# Patient Record
Sex: Female | Born: 1950
Health system: Southern US, Community
[De-identification: ages and names within clinical notes are randomized; demographics above are authoritative.]

## PROBLEM LIST (undated history)

## (undated) DIAGNOSIS — Z8669 Personal history of other diseases of the nervous system and sense organs: Principal | ICD-10-CM

## (undated) DIAGNOSIS — Z8711 Personal history of peptic ulcer disease: Secondary | ICD-10-CM

## (undated) DIAGNOSIS — R489 Unspecified symbolic dysfunctions: Secondary | ICD-10-CM

## (undated) DIAGNOSIS — Z8719 Personal history of other diseases of the digestive system: Secondary | ICD-10-CM

## (undated) DIAGNOSIS — E0590 Thyrotoxicosis, unspecified without thyrotoxic crisis or storm: Secondary | ICD-10-CM

## (undated) DIAGNOSIS — K5790 Diverticulosis of intestine, part unspecified, without perforation or abscess without bleeding: Secondary | ICD-10-CM

## (undated) DIAGNOSIS — G459 Transient cerebral ischemic attack, unspecified: Secondary | ICD-10-CM

## (undated) DIAGNOSIS — I10 Essential (primary) hypertension: Secondary | ICD-10-CM

## (undated) DIAGNOSIS — F1010 Alcohol abuse, uncomplicated: Secondary | ICD-10-CM

## (undated) DIAGNOSIS — E1142 Type 2 diabetes mellitus with diabetic polyneuropathy: Secondary | ICD-10-CM

## (undated) DIAGNOSIS — T7840XA Allergy, unspecified, initial encounter: Secondary | ICD-10-CM

## (undated) DIAGNOSIS — G8389 Other specified paralytic syndromes: Secondary | ICD-10-CM

## (undated) DIAGNOSIS — G4700 Insomnia, unspecified: Secondary | ICD-10-CM

## (undated) DIAGNOSIS — R627 Adult failure to thrive: Secondary | ICD-10-CM

## (undated) DIAGNOSIS — Z5189 Encounter for other specified aftercare: Secondary | ICD-10-CM

## (undated) DIAGNOSIS — E876 Hypokalemia: Secondary | ICD-10-CM

## (undated) DIAGNOSIS — J189 Pneumonia, unspecified organism: Secondary | ICD-10-CM

## (undated) DIAGNOSIS — D3190 Benign neoplasm of unspecified part of unspecified eye: Secondary | ICD-10-CM

## (undated) DIAGNOSIS — G8250 Quadriplegia, unspecified: Secondary | ICD-10-CM

## (undated) DIAGNOSIS — K219 Gastro-esophageal reflux disease without esophagitis: Secondary | ICD-10-CM

## (undated) DIAGNOSIS — E785 Hyperlipidemia, unspecified: Secondary | ICD-10-CM

## (undated) DIAGNOSIS — R279 Unspecified lack of coordination: Secondary | ICD-10-CM

## (undated) DIAGNOSIS — M6281 Muscle weakness (generalized): Secondary | ICD-10-CM

## (undated) DIAGNOSIS — R413 Other amnesia: Secondary | ICD-10-CM

## (undated) HISTORY — DX: Type 2 diabetes mellitus with diabetic polyneuropathy: E11.42

## (undated) HISTORY — DX: Alcohol abuse, uncomplicated: F10.10

## (undated) HISTORY — PX: ABDOMINAL HYSTERECTOMY: SHX81

## (undated) HISTORY — DX: Thyrotoxicosis, unspecified without thyrotoxic crisis or storm: E05.90

## (undated) HISTORY — DX: Personal history of other diseases of the nervous system and sense organs: Z86.69

## (undated) HISTORY — DX: Unspecified symbolic dysfunctions: R48.9

## (undated) HISTORY — DX: Insomnia, unspecified: G47.00

## (undated) HISTORY — DX: Quadriplegia, unspecified: G82.50

## (undated) HISTORY — DX: Pneumonia, unspecified organism: J18.9

## (undated) HISTORY — PX: ABDOMINAL HYSTERECTOMY: SUR658

## (undated) HISTORY — DX: Essential (primary) hypertension: I10

## (undated) HISTORY — DX: Unspecified lack of coordination: R27.9

## (undated) HISTORY — PX: OTHER SURGICAL HISTORY: SHX169

## (undated) HISTORY — DX: Hypokalemia: E87.6

## (undated) HISTORY — DX: Benign neoplasm of unspecified part of unspecified eye: D31.90

## (undated) HISTORY — DX: Other amnesia: R41.3

## (undated) HISTORY — DX: Encounter for other specified aftercare: Z51.89

## (undated) HISTORY — DX: Other specified paralytic syndromes: G83.89

## (undated) HISTORY — DX: Muscle weakness (generalized): M62.81

## (undated) HISTORY — DX: Adult failure to thrive: R62.7

## (undated) HISTORY — DX: Gastro-esophageal reflux disease without esophagitis: K21.9

## (undated) HISTORY — DX: Allergy, unspecified, initial encounter: T78.40XA

## (undated) HISTORY — DX: Hyperlipidemia, unspecified: E78.5

## (undated) HISTORY — PX: WISDOM TOOTH EXTRACTION: SHX21

---

## 2011-05-11 DIAGNOSIS — J189 Pneumonia, unspecified organism: Secondary | ICD-10-CM

## 2011-05-11 HISTORY — DX: Pneumonia, unspecified organism: J18.9

## 2012-01-30 ENCOUNTER — Emergency Department (HOSPITAL_COMMUNITY)
Admission: EM | Admit: 2012-01-30 | Discharge: 2012-01-30 | Disposition: A | Discharge status: Home / Self Care | Payer: BC Managed Care – HMO | Attending: Emergency Medicine | Admitting: Emergency Medicine

## 2012-01-30 ENCOUNTER — Encounter (HOSPITAL_COMMUNITY): Payer: Self-pay | Admitting: *Deleted

## 2012-01-30 DIAGNOSIS — R7989 Other specified abnormal findings of blood chemistry: Secondary | ICD-10-CM

## 2012-01-30 DIAGNOSIS — E860 Dehydration: Secondary | ICD-10-CM | POA: Insufficient documentation

## 2012-01-30 DIAGNOSIS — N10 Acute tubulo-interstitial nephritis: Secondary | ICD-10-CM

## 2012-01-30 HISTORY — DX: Diverticulosis of intestine, part unspecified, without perforation or abscess without bleeding: K57.90

## 2012-01-30 LAB — URINALYSIS, ROUTINE W REFLEX MICROSCOPIC
Nitrite: NEGATIVE
Specific Gravity, Urine: 1.014 (ref 1.005–1.030)
Urobilinogen, UA: 0.2 mg/dL (ref 0.0–1.0)

## 2012-01-30 LAB — CBC WITH DIFFERENTIAL/PLATELET
Basophils Relative: 0 % (ref 0–1)
Eosinophils Absolute: 0.1 10*3/uL (ref 0.0–0.7)
Eosinophils Relative: 1 % (ref 0–5)
MCH: 31.5 pg (ref 26.0–34.0)
MCHC: 34.5 g/dL (ref 30.0–36.0)
Monocytes Relative: 15 % — ABNORMAL HIGH (ref 3–12)
Neutrophils Relative %: 78 % — ABNORMAL HIGH (ref 43–77)
Platelets: 402 10*3/uL — ABNORMAL HIGH (ref 150–400)

## 2012-01-30 LAB — COMPREHENSIVE METABOLIC PANEL
Albumin: 4 g/dL (ref 3.5–5.2)
Alkaline Phosphatase: 198 U/L — ABNORMAL HIGH (ref 39–117)
BUN: 56 mg/dL — ABNORMAL HIGH (ref 6–23)
Calcium: 10.5 mg/dL (ref 8.4–10.5)
Potassium: 4.1 mEq/L (ref 3.5–5.1)
Sodium: 137 mEq/L (ref 135–145)
Total Protein: 7.7 g/dL (ref 6.0–8.3)

## 2012-01-30 LAB — URINE MICROSCOPIC-ADD ON

## 2012-01-30 MED ORDER — DEXTROSE 5 % IV SOLN
1.0000 g | Freq: Once | INTRAVENOUS | Status: AC
  Administered 2012-01-30: 1 g via INTRAVENOUS
  Filled 2012-01-30: qty 10

## 2012-01-30 MED ORDER — CEPHALEXIN 500 MG PO CAPS: 500.0000 mg | ORAL_CAPSULE | Freq: Four times a day (QID) | ORAL | Status: DC

## 2012-01-30 MED ORDER — SODIUM CHLORIDE 0.9 % IV BOLUS (SEPSIS)
1000.0000 mL | Freq: Once | INTRAVENOUS | Status: AC
  Administered 2012-01-30: 1000 mL via INTRAVENOUS

## 2012-01-30 MED ORDER — ONDANSETRON HCL 4 MG PO TABS: 4.0000 mg | ORAL_TABLET | Freq: Four times a day (QID) | ORAL | Status: DC

## 2012-01-30 MED ORDER — HYDROCODONE-ACETAMINOPHEN 5-500 MG PO TABS: 1.0000 | ORAL_TABLET | Freq: Four times a day (QID) | ORAL | Status: DC | PRN

## 2012-01-30 NOTE — ED Notes (Signed)
Pt resting quietly at the time. Vital signs stable. Family at bedside. No signs of distress noted at present.

## 2012-01-30 NOTE — ED Notes (Signed)
Pt discharged home. Had no further questions at the time. Vital signs stable. 

## 2012-01-30 NOTE — ED Provider Notes (Signed)
History     CSN: [id number]  Arrival date & time 01/30/12  1352   First MD Initiated Contact with Patient 01/30/12 1620      Chief Complaint  Patient presents with  . Abdominal Pain    (Consider location/radiation/quality/duration/timing/severity/associated sxs/prior treatment) HPI Comments: Abigail Powell is a 61 y.o. Female who presents with complaint of dysuria, lower abdominal pain, nausea, right flank pain. States symptoms began with urinary frequency and dysuria about 2 days ago. Today noted right flank pain, and worsening lower abdominal pain. States has hx of diverticulosis. Denies fever, denies vomiting. Denies any changes with bowels. States no history of the same. Took tylenol for the pain prior to coming in. Pain worsened with palpation and urination. Nothing makes it better.    Past Medical History  Diagnosis Date  . Diverticulosis     History reviewed. No pertinent past surgical history.  History reviewed. No pertinent family history.  History  Substance Use Topics  . Smoking status: Never Smoker   . Smokeless tobacco: Not on file  . Alcohol Use: No    OB History    Grav Para Term Preterm Abortions TAB SAB Ect Mult Living                  Review of Systems  Constitutional: Negative for fever, chills, activity change, appetite change and fatigue.  HENT: Negative for neck pain.   Respiratory: Negative.   Cardiovascular: Negative.   Gastrointestinal: Positive for nausea and abdominal pain. Negative for vomiting, diarrhea and constipation.  Genitourinary: Positive for dysuria, frequency and flank pain. Negative for urgency.  Musculoskeletal: Negative for myalgias and back pain.  Skin: Negative.   Neurological: Negative for weakness and numbness.  Hematological: Negative.     Allergies  Asa  Home Medications   Current Outpatient Rx  Name Route Sig Dispense Refill  . ACETAMINOPHEN 500 MG PO TABS Oral Take 500 mg by mouth every 6 (six) hours as needed.  For pain    . AMLODIPINE-OLMESARTAN 5-20 MG PO TABS Oral Take 1 tablet by mouth daily.    Marland Kitchen VITAMIN C PO Oral Take 1 tablet by mouth daily.    Marland Kitchen VITAMIN D PO Oral Take 1 tablet by mouth daily.    Marland Kitchen GEMFIBROZIL 600 MG PO TABS Oral Take 600 mg by mouth 2 (two) times daily before a meal.    . VITAMIN E PO Oral Take 1 capsule by mouth daily.      BP 113/59  Pulse 104  Temp 97.6 F (36.4 C) (Oral)  Resp 20  SpO2 100%  Physical Exam  Nursing note and vitals reviewed. Constitutional: She appears well-developed and well-nourished. No distress.  HENT:  Head: Normocephalic.  Eyes: Conjunctivae normal are normal.  Neck: Neck supple.  Cardiovascular: Normal rate, regular rhythm and normal heart sounds.   Pulmonary/Chest: Effort normal and breath sounds normal. No respiratory distress. She has no wheezes. She has no rales.  Abdominal: Soft. Bowel sounds are normal. She exhibits no distension. There is tenderness. There is no rebound and no guarding.       suprapubic tenderness. Right CVA tenderness.  Musculoskeletal: She exhibits no edema.  Neurological: She is alert.  Skin: Skin is warm and dry.  Psychiatric: She has a normal mood and affect.    ED Course  Procedures (including critical care time)  Pt with right flank pain, suprapubic pain, urinary frequency, dysuria. Labs and UA pending.   Filed Vitals:   01/30/12 1357  BP: 113/59  Pulse: 104  Temp: 97.6 F (36.4 C)  Resp: 20   Results for orders placed during the hospital encounter of 01/30/12  URINALYSIS, ROUTINE W REFLEX MICROSCOPIC      Component Value Range   Color, Urine YELLOW  YELLOW   APPearance TURBID (*) CLEAR   Specific Gravity, Urine 1.014  1.005 - 1.030   pH 6.0  5.0 - 8.0   Glucose, UA NEGATIVE  NEGATIVE mg/dL   Hgb urine dipstick LARGE (*) NEGATIVE   Bilirubin Urine NEGATIVE  NEGATIVE   Ketones, ur NEGATIVE  NEGATIVE mg/dL   Protein, ur 161 (*) NEGATIVE mg/dL   Urobilinogen, UA 0.2  0.0 - 1.0 mg/dL    Nitrite NEGATIVE  NEGATIVE   Leukocytes, UA LARGE (*) NEGATIVE  URINE MICROSCOPIC-ADD ON      Component Value Range   WBC, UA TOO NUMEROUS TO COUNT  <3 WBC/hpf   RBC / HPF 7-10  <3 RBC/hpf   Bacteria, UA FEW (*) RARE  CBC WITH DIFFERENTIAL      Component Value Range   WBC 14.6 (*) 4.0 - 10.5 K/uL   RBC 4.19  3.87 - 5.11 MIL/uL   Hemoglobin 13.2  12.0 - 15.0 g/dL   HCT 09.6  04.5 - 40.9 %   MCV 91.4  78.0 - 100.0 fL   MCH 31.5  26.0 - 34.0 pg   MCHC 34.5  30.0 - 36.0 g/dL   RDW 81.1  91.4 - 78.2 %   Platelets 402 (*) 150 - 400 K/uL   Neutrophils Relative 78 (*) 43 - 77 %   Neutro Abs 11.4 (*) 1.7 - 7.7 K/uL   Lymphocytes Relative 6 (*) 12 - 46 %   Lymphs Abs 0.8  0.7 - 4.0 K/uL   Monocytes Relative 15 (*) 3 - 12 %   Monocytes Absolute 2.3 (*) 0.1 - 1.0 K/uL   Eosinophils Relative 1  0 - 5 %   Eosinophils Absolute 0.1  0.0 - 0.7 K/uL   Basophils Relative 0  0 - 1 %   Basophils Absolute 0.0  0.0 - 0.1 K/uL  COMPREHENSIVE METABOLIC PANEL      Component Value Range   Sodium 137  135 - 145 mEq/L   Potassium 4.1  3.5 - 5.1 mEq/L   Chloride 106  96 - 112 mEq/L   CO2 12 (*) 19 - 32 mEq/L   Glucose, Bld 124 (*) 70 - 99 mg/dL   BUN 56 (*) 6 - 23 mg/dL   Creatinine, Ser 9.56 (*) 0.50 - 1.10 mg/dL   Calcium 21.3  8.4 - 08.6 mg/dL   Total Protein 7.7  6.0 - 8.3 g/dL   Albumin 4.0  3.5 - 5.2 g/dL   AST 578 (*) 0 - 37 U/L   ALT 212 (*) 0 - 35 U/L   Alkaline Phosphatase 198 (*) 39 - 117 U/L   Total Bilirubin 0.4  0.3 - 1.2 mg/dL   GFR calc non Af Amer 45 (*) >90 mL/min   GFR calc Af Amer 52 (*) >90 mL/min   6:05 PM Pt's ua is infected. Cultures sent. Her WBC elevated, consistent with her infection. Based on exam, suspect pyelonephritis. She received rocephin 1gIV in ED. She is non toxic. She is afebrile, tolerating PO fluids, no vomiting. VS normal. Abdomen soft, no surgical abdomen. I believe pt is stable at this time for d/c home and outpatient therapy. Her LFTs are elevated, and  this was discussed  with her and her family, and she will need further follow up for recheck. She is not tender in RUQ, doubt acute hepatitis or cholecystitis.   Filed Vitals:   01/30/12 1810  BP: 98/64  Pulse: 92  Temp: 97.2 F (36.2 C)  Resp: 22     1. Pyelonephritis, acute   2. Elevated LFTs   3. Dehydration       MDM   Pt with supraprubic pain, Right CVA tenderness/flank pain. UA infected. Treat for pyelonephritis. Pt's vs normal, she is non toxic, not vomiting, appropriate for outpatient therapy. LFTs elevated, will need further testing and monitoring, no sighs of acute hepatitis or cholecystitis. Renal function slightly bumped, suspect due to dehydration. Instructed to return if worsening.         Lottie Mussel, PA 01/30/12 [phone]

## 2012-01-30 NOTE — ED Notes (Signed)
To ED for eval of lower abd pain. Started last night. Pt states she has pain after urination

## 2012-01-31 NOTE — ED Provider Notes (Signed)
Medical screening examination/treatment/procedure(s) were performed by non-physician practitioner and as supervising physician I was immediately available for consultation/collaboration.   Shelda Jakes, MD 01/31/12 1329

## 2012-02-01 LAB — URINE CULTURE: Colony Count: >=100000

## 2012-02-02 ENCOUNTER — Telehealth (HOSPITAL_COMMUNITY): Payer: Self-pay | Admitting: Emergency Medicine

## 2012-02-02 NOTE — ED Notes (Signed)
Results received from [facility], >/= 100,000 colonies -> E Coli.   Rx given in ED for Keflex  -> sensitive to the same.  Chart appended per protocol.  

## 2012-02-25 ENCOUNTER — Observation Stay (HOSPITAL_COMMUNITY)
Admission: EM | Admit: 2012-02-25 | Discharge: 2012-02-28 | Disposition: A | Discharge status: Home / Self Care | Payer: BC Managed Care – HMO | Source: Ambulatory Visit | Attending: Surgery | Admitting: Surgery

## 2012-02-25 ENCOUNTER — Emergency Department (HOSPITAL_COMMUNITY): Payer: BC Managed Care – HMO

## 2012-02-25 ENCOUNTER — Encounter (HOSPITAL_COMMUNITY): Payer: Self-pay | Admitting: *Deleted

## 2012-02-25 DIAGNOSIS — R109 Unspecified abdominal pain: Secondary | ICD-10-CM

## 2012-02-25 DIAGNOSIS — D649 Anemia, unspecified: Secondary | ICD-10-CM | POA: Insufficient documentation

## 2012-02-25 DIAGNOSIS — K8010 Calculus of gallbladder with chronic cholecystitis without obstruction: Principal | ICD-10-CM | POA: Insufficient documentation

## 2012-02-25 DIAGNOSIS — Z8711 Personal history of peptic ulcer disease: Secondary | ICD-10-CM | POA: Insufficient documentation

## 2012-02-25 DIAGNOSIS — R112 Nausea with vomiting, unspecified: Secondary | ICD-10-CM | POA: Insufficient documentation

## 2012-02-25 LAB — COMPREHENSIVE METABOLIC PANEL
ALT: 10 U/L (ref 0–35)
AST: 14 U/L (ref 0–37)
Albumin: 3.8 g/dL (ref 3.5–5.2)
Alkaline Phosphatase: 68 U/L (ref 39–117)
BUN: 6 mg/dL (ref 6–23)
CO2: 27 mEq/L (ref 19–32)
Calcium: 9.3 mg/dL (ref 8.4–10.5)
Chloride: 98 mEq/L (ref 96–112)
Creatinine, Ser: 0.78 mg/dL (ref 0.50–1.10)
GFR calc Af Amer: >90 mL/min (ref >90)
GFR calc non Af Amer: 88 mL/min — ABNORMAL LOW (ref >90)
Glucose, Bld: 111 mg/dL — ABNORMAL HIGH (ref 70–99)
Potassium: 3.6 mEq/L (ref 3.5–5.1)
Sodium: 135 mEq/L (ref 135–145)
Total Bilirubin: 0.2 mg/dL — ABNORMAL LOW (ref 0.3–1.2)
Total Protein: 7.4 g/dL (ref 6.0–8.3)

## 2012-02-25 LAB — CBC WITH DIFFERENTIAL/PLATELET
Basophils Absolute: 0 10*3/uL (ref 0.0–0.1)
Basophils Relative: 0 % (ref 0–1)
Eosinophils Absolute: 0.2 10*3/uL (ref 0.0–0.7)
Eosinophils Relative: 2 % (ref 0–5)
HCT: 41.6 % (ref 36.0–46.0)
Hemoglobin: 14.5 g/dL (ref 12.0–15.0)
Lymphocytes Relative: 12 % (ref 12–46)
Lymphs Abs: 1.2 10*3/uL (ref 0.7–4.0)
MCH: 30.3 pg (ref 26.0–34.0)
MCHC: 34.9 g/dL (ref 30.0–36.0)
MCV: 86.8 fL (ref 78.0–100.0)
Monocytes Absolute: 0.4 10*3/uL (ref 0.1–1.0)
Monocytes Relative: 4 % (ref 3–12)
Neutro Abs: 8.5 10*3/uL — ABNORMAL HIGH (ref 1.7–7.7)
Neutrophils Relative %: 83 % — ABNORMAL HIGH (ref 43–77)
Platelets: 279 10*3/uL (ref 150–400)
RBC: 4.79 MIL/uL (ref 3.87–5.11)
RDW: 12.8 % (ref 11.5–15.5)
WBC: 10.2 10*3/uL (ref 4.0–10.5)

## 2012-02-25 LAB — URINE MICROSCOPIC-ADD ON

## 2012-02-25 LAB — URINALYSIS, ROUTINE W REFLEX MICROSCOPIC
Glucose, UA: NEGATIVE mg/dL
Hgb urine dipstick: NEGATIVE
Nitrite: NEGATIVE
Protein, ur: 100 mg/dL — AB
Specific Gravity, Urine: 1.017 (ref 1.005–1.030)
Urobilinogen, UA: 0.2 mg/dL (ref 0.0–1.0)
pH: 5.5 (ref 5.0–8.0)

## 2012-02-25 LAB — LIPASE, BLOOD: Lipase: 27 U/L (ref 11–59)

## 2012-02-25 MED ORDER — ONDANSETRON HCL 4 MG/2ML IJ SOLN
INTRAMUSCULAR | Status: AC
  Administered 2012-02-25: 4 mg
  Filled 2012-02-25: qty 2

## 2012-02-25 MED ORDER — SODIUM CHLORIDE 0.9 % IV BOLUS (SEPSIS)
1000.0000 mL | Freq: Once | INTRAVENOUS | Status: AC
  Administered 2012-02-25: 1000 mL via INTRAVENOUS

## 2012-02-25 MED ORDER — MORPHINE SULFATE 4 MG/ML IJ SOLN
4.0000 mg | Freq: Once | INTRAMUSCULAR | Status: AC
  Administered 2012-02-25: 4 mg via INTRAVENOUS
  Filled 2012-02-25: qty 1

## 2012-02-25 MED ORDER — ONDANSETRON HCL 4 MG/2ML IJ SOLN
4.0000 mg | Freq: Once | INTRAMUSCULAR | Status: AC
  Administered 2012-02-25: 4 mg via INTRAVENOUS
  Filled 2012-02-25: qty 2

## 2012-02-25 MED ORDER — IOHEXOL 300 MG/ML  SOLN
80.0000 mL | Freq: Once | INTRAMUSCULAR | Status: AC | PRN
  Administered 2012-02-25: 80 mL via INTRAVENOUS

## 2012-02-25 MED ORDER — MORPHINE SULFATE 4 MG/ML IJ SOLN
6.0000 mg | Freq: Once | INTRAMUSCULAR | Status: AC
  Administered 2012-02-25: 6 mg via INTRAVENOUS
  Filled 2012-02-25: qty 2

## 2012-02-25 NOTE — H&P (Signed)
Reason for Consult:abdominal pain Referring Physician: Faviola Powell is an 61 y.o. female.  HPI: I was asked to evaluate this patient for cholelithiasis and abdominal pain. She was seen about one month ago in the emergency room for some right flank pain and was treated for a urinary tract infection. Her symptoms seem to get better but over the last month she has had anorexia. Over the last 3-4 days she has had increasing nausea with some abdominal pain which she describes as a 5/10 pain located mainly in the right upper quadrant. She's also had diarrhea and some chills and some subjective fevers she has not measured her temperature. She denies any heartburn or reflux and says that she is healthy other than a history of hypertension  Past Medical History  Diagnosis Date  . Diverticulosis     History reviewed. No pertinent past surgical history.  No family history on file.  Social History:  reports that she has never smoked. She does not have any smokeless tobacco history on file. She reports that she does not drink alcohol. Her drug history not on file.  Allergies:  Allergies  Allergen Reactions  . Asa (Aspirin) Other (See Comments)    Causes ulcer    Medications: I have reviewed the patient's current medications.  Results for orders placed during the hospital encounter of 02/25/12 (from the past 48 hour(s))  CBC WITH DIFFERENTIAL     Status: Abnormal   Collection Time   02/25/12  1:51 PM      Component Value Range Comment   WBC 10.2  4.0 - 10.5 K/uL    RBC 4.79  3.87 - 5.11 MIL/uL    Hemoglobin 14.5  12.0 - 15.0 g/dL    HCT 16.1  09.6 - 04.5 %    MCV 86.8  78.0 - 100.0 fL    MCH 30.3  26.0 - 34.0 pg    MCHC 34.9  30.0 - 36.0 g/dL    RDW 40.9  81.1 - 91.4 %    Platelets 279  150 - 400 K/uL    Neutrophils Relative 83 (*) 43 - 77 %    Neutro Abs 8.5 (*) 1.7 - 7.7 K/uL    Lymphocytes Relative 12  12 - 46 %    Lymphs Abs 1.2  0.7 - 4.0 K/uL    Monocytes Relative 4  3  - 12 %    Monocytes Absolute 0.4  0.1 - 1.0 K/uL    Eosinophils Relative 2  0 - 5 %    Eosinophils Absolute 0.2  0.0 - 0.7 K/uL    Basophils Relative 0  0 - 1 %    Basophils Absolute 0.0  0.0 - 0.1 K/uL   COMPREHENSIVE METABOLIC PANEL     Status: Abnormal   Collection Time   02/25/12  1:51 PM      Component Value Range Comment   Sodium 135  135 - 145 mEq/L    Potassium 3.6  3.5 - 5.1 mEq/L    Chloride 98  96 - 112 mEq/L    CO2 27  19 - 32 mEq/L    Glucose, Bld 111 (*) 70 - 99 mg/dL    BUN 6  6 - 23 mg/dL    Creatinine, Ser 7.82  0.50 - 1.10 mg/dL    Calcium 9.3  8.4 - 95.6 mg/dL    Total Protein 7.4  6.0 - 8.3 g/dL    Albumin 3.8  3.5 - 5.2 g/dL  AST 14  0 - 37 U/L    ALT 10  0 - 35 U/L    Alkaline Phosphatase 68  39 - 117 U/L    Total Bilirubin 0.2 (*) 0.3 - 1.2 mg/dL    GFR calc non Af Amer 88 (*) >90 mL/min    GFR calc Af Amer >90  >90 mL/min   URINALYSIS, ROUTINE W REFLEX MICROSCOPIC     Status: Abnormal   Collection Time   02/25/12  2:43 PM      Component Value Range Comment   Color, Urine AMBER (*) YELLOW BIOCHEMICALS MAY BE AFFECTED BY COLOR   APPearance CLOUDY (*) CLEAR    Specific Gravity, Urine 1.017  1.005 - 1.030    pH 5.5  5.0 - 8.0    Glucose, UA NEGATIVE  NEGATIVE mg/dL    Hgb urine dipstick NEGATIVE  NEGATIVE    Bilirubin Urine MODERATE (*) NEGATIVE    Ketones, ur TRACE (*) NEGATIVE mg/dL    Protein, ur 409 (*) NEGATIVE mg/dL    Urobilinogen, UA 0.2  0.0 - 1.0 mg/dL    Nitrite NEGATIVE  NEGATIVE    Leukocytes, UA SMALL (*) NEGATIVE   URINE MICROSCOPIC-ADD ON     Status: Abnormal   Collection Time   02/25/12  2:43 PM      Component Value Range Comment   Squamous Epithelial / LPF FEW (*) RARE    WBC, UA 11-20  <3 WBC/hpf    Bacteria, UA FEW (*) RARE   LIPASE, BLOOD     Status: Normal   Collection Time   02/25/12  4:41 PM      Component Value Range Comment   Lipase 27  11 - 59 U/L     US Abdomen Complete  02/25/2012  *RADIOLOGY REPORT*   Clinical Data:  Evaluate for cholecystitis  COMPLETE ABDOMINAL ULTRASOUND  Comparison:  Prior CT scan same day  Findings:  Gallbladder:  Distended gallbladder is noted.  Tiny gallstones and sludge noted layering within gallbladder.  No thickening of the gallbladder wall.  No sonographic Murphy's sign.  No pericholecystic fluid.  Common bile duct:  Measures 5 mm in diameter within normal limits.  Liver:  No focal lesion identified.  Within normal limits in parenchymal echogenicity.  IVC:  Appears normal.  Pancreas:  No focal abnormality seen.  Spleen:  Measures 4.2 cm in length.  Normal echogenicity.  Right Kidney:  Measures 11.7 cm in length.  No mass, hydronephrosis or diagnostic renal calculus  Left Kidney:  Measures 10.7 cm in length.  No mass, hydronephrosis or diagnostic renal calculus  Abdominal aorta:  No aneurysm identified. Measures up to 2.3 cm in diameter.  IMPRESSION: 1. Distended gallbladder is noted.  Tiny gallstones and sludge noted layering within gallbladder.  No thickening of the gallbladder wall.  No sonographic Murphy's sign.  No pericholecystic fluid. 2.  Normal CBD. 3.  No hydronephrosis or diagnostic renal calculus.   Original Report Authenticated By: Natasha Mead, M.D.    Ct Abdomen Pelvis W Contrast  02/25/2012  *RADIOLOGY REPORT*  Clinical Data: Left lower quadrant pain, history of diverticulosis  CT ABDOMEN AND PELVIS WITH CONTRAST  Technique:  Multidetector CT imaging of the abdomen and pelvis was performed following the standard protocol during bolus administration of intravenous contrast.  Contrast: 80mL OMNIPAQUE IOHEXOL 300 MG/ML  SOLN  Comparison: None.  Findings: Sagittal images of the spine shows no destructive bony lesions.  Lung bases are unremarkable.  Liver, spleen, pancreas  and adrenal glands are unremarkable.  A distended gallbladder is noted.  Layering tiny gallstones are noted within gallbladder the largest measures 2.7 mm.  No pericholecystic fluid.  Kidneys are  symmetrical in size and enhancement.  There is a cyst in the mid pole posterior aspect of the right kidney measures 7 mm. A cyst in the lower pole anterior aspect of the left kidney measures 6 mm.  No hydronephrosis or hydroureter.  Delayed renal images shows bilateral renal symmetrical excretion.  Atherosclerotic calcifications of the abdominal aorta and the iliac arteries are noted.  Right colon diverticula are noted without evidence of acute diverticulitis.  No pericecal inflammation.  A normal appendix is clearly visualized axial image 54.  The terminal ileum is unremarkable.  The urinary bladder is under distended grossly unremarkable.  The uterus and adnexa are not identified.  No destructive bony lesions are noted within pelvis.  Coronal images shows no destructive bony lesions.  Few proximal sigmoid colon diverticula are noted without evidence of acute diverticulitis.  No pelvic ascites or adenopathy.  No inguinal adenopathy.  IMPRESSION:  1.  No small bowel obstruction.  No ascites or free air. 2.  There is a distended gallbladder.  Small layering gallstones are noted within gallbladder.  Largest measures 2.7 mm.  No pericholecystic fluid. 3.  No hydronephrosis or hydroureter.  4.  Colonic diverticula are noted without evidence of acute diverticulitis. 5.  Bilateral small renal cyst. 6.  Normal appendix.  No pericecal inflammation.   Original Report Authenticated By: Natasha Mead, M.D.    All other review of systems negative or noncontributory except as stated in the HPI  Blood pressure 103/59, pulse 103, temperature 98.9 F (37.2 C), temperature source Oral, resp. rate 20, weight 120 lb (54.432 kg), SpO2 100.00%. General appearance: alert, cooperative and no distress Head: Normocephalic, without obvious abnormality, atraumatic Eyes: exopthalmos L>R, no icterus Neck: no JVD and supple, symmetrical, trachea midline Resp: clear to auscultation bilaterally Cardio: normal rate, regular GI: soft, mild  right sided and lower abdominal tenderness, greatest in RUQ, ND, no peritoneal signs Extremities: extremities normal, atraumatic, no cyanosis or edema Pulses: 2+ and symmetric Skin: Skin color, texture, turgor normal. No rashes or lesions Neurologic: Grossly normal  Assessment/Plan: Abdominal pain with cholelithiasis She has ongoing abdominal pain and nausea as well as cholelithiasis seen on ultrasound and CT scan. She does not show any evidence of acute cholecystitis and her white blood cell count and LFTs are normal. However given her persistent nausea and abdominal pain, I have recommended admission for at least overnight observation. If she is feeling better in the morning than will have the options to advance her diet and consider discharge and possible elective cholecystectomy versus proceeding with cholecystectomy at this admission. I did discuss with the patient the procedure and its risks and the alternatives. The risks of infection, bleeding, pain, persistent symptoms, scarring, injury to bowel or bile ducts, retained stone, diarrhea, need for additional procedures, and need for open surgery discussed with the patient.  She expressed understanding and agrees with the plan.   Lodema Pilot DAVID 02/25/2012, 11:20 PM

## 2012-02-25 NOTE — ED Provider Notes (Signed)
History    61yf with abdominal pain. Pt has been feeling "not well" for past several weeks, but worse in past few days. Diffuse abdominal "discomfort." Pt hesitant to call it pain. Does not localize. No appreciable exacerbating or relieving factors. Associated with n/v. Diarrhea yesterday which resolved. Recent evaluation and tx for UTI. Says current symptoms feel different. No fever or chills. Surgical hx significant for hysterectomy.   CSN: [id number]  Arrival date & time 02/25/12  1251   First MD Initiated Contact with Patient 02/25/12 1333      Chief Complaint  Patient presents with  . Emesis  . Diarrhea  . Abdominal Pain    (Consider location/radiation/quality/duration/timing/severity/associated sxs/prior treatment) HPI  Past Medical History  Diagnosis Date  . Diverticulosis     History reviewed. No pertinent past surgical history.  No family history on file.  History  Substance Use Topics  . Smoking status: Never Smoker   . Smokeless tobacco: Not on file  . Alcohol Use: No    OB History    Grav Para Term Preterm Abortions TAB SAB Ect Mult Living                  Review of Systems   Review of symptoms negative unless otherwise noted in HPI.   Allergies  Asa  Home Medications   Current Outpatient Rx  Name Route Sig Dispense Refill  . AMLODIPINE-OLMESARTAN 5-20 MG PO TABS Oral Take 1 tablet by mouth daily.    Marland Kitchen VITAMIN C PO Oral Take 1 tablet by mouth daily.    . CEPHALEXIN 500 MG PO CAPS Oral Take 500 mg by mouth 4 (four) times daily.    Marland Kitchen VITAMIN D PO Oral Take 1 tablet by mouth daily.    . OMEGA-3 FATTY ACIDS 1000 MG PO CAPS Oral Take 1 g by mouth daily.    Marland Kitchen GEMFIBROZIL 600 MG PO TABS Oral Take 600 mg by mouth 2 (two) times daily before a meal.    . HYDROCODONE-ACETAMINOPHEN 5-500 MG PO TABS Oral Take 1-2 tablets by mouth every 6 (six) hours as needed for pain. 15 tablet 0  . VITAMIN E PO Oral Take 1 capsule by mouth daily.      BP 100/52   Pulse 100  Temp 97.5 F (36.4 C) (Oral)  Resp 18  Wt 120 lb (54.432 kg)  SpO2 100%  Physical Exam  Nursing note and vitals reviewed. Constitutional: She appears well-developed and well-nourished. No distress.  HENT:  Head: Normocephalic and atraumatic.  Eyes: Conjunctivae normal are normal. Right eye exhibits no discharge. Left eye exhibits no discharge.  Neck: Neck supple.  Cardiovascular: Regular rhythm and normal heart sounds.  Exam reveals no gallop and no friction rub.   No murmur heard.      Mildly tachycardic  Pulmonary/Chest: Effort normal and breath sounds normal. No respiratory distress.  Abdominal: Soft. She exhibits no distension. There is tenderness. There is guarding. There is no rebound.       Diffuse tenderness, worse on L side  Genitourinary:       No cva tenderness  Musculoskeletal: She exhibits no edema and no tenderness.  Neurological: She is alert.  Skin: Skin is warm and dry.  Psychiatric: She has a normal mood and affect. Her behavior is normal. Thought content normal.    ED Course  Procedures (including critical care time)  Labs Reviewed  CBC WITH DIFFERENTIAL - Abnormal; Notable for the following:    Neutrophils Relative 83 (*)  Neutro Abs 8.5 (*)     All other components within normal limits  COMPREHENSIVE METABOLIC PANEL - Abnormal; Notable for the following:    Glucose, Bld 111 (*)     Total Bilirubin 0.2 (*)     GFR calc non Af Amer 88 (*)     All other components within normal limits  URINALYSIS, ROUTINE W REFLEX MICROSCOPIC - Abnormal; Notable for the following:    Color, Urine AMBER (*)  BIOCHEMICALS MAY BE AFFECTED BY COLOR   APPearance CLOUDY (*)     Bilirubin Urine MODERATE (*)     Ketones, ur TRACE (*)     Protein, ur 100 (*)     Leukocytes, UA SMALL (*)     All other components within normal limits  URINE MICROSCOPIC-ADD ON - Abnormal; Notable for the following:    Squamous Epithelial / LPF FEW (*)     Bacteria, UA FEW (*)       All other components within normal limits  LIPASE, BLOOD   Ct Abdomen Pelvis W Contrast  02/25/2012  *RADIOLOGY REPORT*  Clinical Data: Left lower quadrant pain, history of diverticulosis  CT ABDOMEN AND PELVIS WITH CONTRAST  Technique:  Multidetector CT imaging of the abdomen and pelvis was performed following the standard protocol during bolus administration of intravenous contrast.  Contrast: 80mL OMNIPAQUE IOHEXOL 300 MG/ML  SOLN  Comparison: None.  Findings: Sagittal images of the spine shows no destructive bony lesions.  Lung bases are unremarkable.  Liver, spleen, pancreas and adrenal glands are unremarkable.  A distended gallbladder is noted.  Layering tiny gallstones are noted within gallbladder the largest measures 2.7 mm.  No pericholecystic fluid.  Kidneys are symmetrical in size and enhancement.  There is a cyst in the mid pole posterior aspect of the right kidney measures 7 mm. A cyst in the lower pole anterior aspect of the left kidney measures 6 mm.  No hydronephrosis or hydroureter.  Delayed renal images shows bilateral renal symmetrical excretion.  Atherosclerotic calcifications of the abdominal aorta and the iliac arteries are noted.  Right colon diverticula are noted without evidence of acute diverticulitis.  No pericecal inflammation.  A normal appendix is clearly visualized axial image 54.  The terminal ileum is unremarkable.  The urinary bladder is under distended grossly unremarkable.  The uterus and adnexa are not identified.  No destructive bony lesions are noted within pelvis.  Coronal images shows no destructive bony lesions.  Few proximal sigmoid colon diverticula are noted without evidence of acute diverticulitis.  No pelvic ascites or adenopathy.  No inguinal adenopathy.  IMPRESSION:  1.  No small bowel obstruction.  No ascites or free air. 2.  There is a distended gallbladder.  Small layering gallstones are noted within gallbladder.  Largest measures 2.7 mm.  No  pericholecystic fluid. 3.  No hydronephrosis or hydroureter.  4.  Colonic diverticula are noted without evidence of acute diverticulitis. 5.  Bilateral small renal cyst. 6.  Normal appendix.  No pericecal inflammation.   Original Report Authenticated By: Natasha Mead, M.D.      1. Abdominal pain       MDM  61yF with abdominal pain. Diffuse tenderness on exam, but does seem to be worse on L side. Re-examination unchanged after meds. CT with gallstones, GB distension and GB wall upper limits of normal. Will [place].         Raeford Razor, MD 02/25/12 1655

## 2012-02-25 NOTE — ED Notes (Signed)
Pt's daughter states "she was seen here recently for a kidney infection, she just moved down here from [place] 1 month ago and she has been sick the whole time"; pt states "I feel different than I do when I was here last time"

## 2012-02-26 ENCOUNTER — Encounter (HOSPITAL_COMMUNITY): Payer: Self-pay | Admitting: Anesthesiology

## 2012-02-26 ENCOUNTER — Observation Stay (HOSPITAL_COMMUNITY): Payer: BC Managed Care – HMO | Admitting: Anesthesiology

## 2012-02-26 ENCOUNTER — Encounter (HOSPITAL_COMMUNITY)
Admission: EM | Disposition: A | Discharge status: Home / Self Care | Payer: Self-pay | Source: Ambulatory Visit | Attending: Emergency Medicine

## 2012-02-26 ENCOUNTER — Observation Stay (HOSPITAL_COMMUNITY): Payer: BC Managed Care – HMO

## 2012-02-26 HISTORY — PX: CHOLECYSTECTOMY: SHX55

## 2012-02-26 LAB — CBC
MCH: 30.4 pg (ref 26.0–34.0)
MCHC: 33.3 g/dL (ref 30.0–36.0)
MCV: 91.1 fL (ref 78.0–100.0)
Platelets: 332 10*3/uL (ref 150–400)
Platelets: 305 10*3/uL (ref 150–400)
RBC: 3.09 MIL/uL — ABNORMAL LOW (ref 3.87–5.11)
RDW: 12.9 % (ref 11.5–15.5)
RDW: 12.9 % (ref 11.5–15.5)
WBC: 6.9 10*3/uL (ref 4.0–10.5)

## 2012-02-26 LAB — COMPREHENSIVE METABOLIC PANEL
Albumin: 2.8 g/dL — ABNORMAL LOW (ref 3.5–5.2)
Alkaline Phosphatase: 115 U/L (ref 39–117)
BUN: 17 mg/dL (ref 6–23)
Chloride: 110 mEq/L (ref 96–112)
Creatinine, Ser: 1.04 mg/dL (ref 0.50–1.10)
GFR calc Af Amer: 65 mL/min — ABNORMAL LOW (ref >90)
Glucose, Bld: 120 mg/dL — ABNORMAL HIGH (ref 70–99)
Potassium: 3.8 mEq/L (ref 3.5–5.1)
Total Bilirubin: 0.6 mg/dL (ref 0.3–1.2)
Total Protein: 5.4 g/dL — ABNORMAL LOW (ref 6.0–8.3)

## 2012-02-26 SURGERY — LAPAROSCOPIC CHOLECYSTECTOMY WITH INTRAOPERATIVE CHOLANGIOGRAM
Anesthesia: General | Site: Abdomen | Wound class: Clean Contaminated

## 2012-02-26 MED ORDER — ENOXAPARIN SODIUM 40 MG/0.4ML ~~LOC~~ SOLN
40.0000 mg | SUBCUTANEOUS | Status: DC
  Administered 2012-02-27 – 2012-02-28 (×2): 40 mg via SUBCUTANEOUS
  Filled 2012-02-26 (×4): qty 0.4

## 2012-02-26 MED ORDER — CIPROFLOXACIN IN D5W 400 MG/200ML IV SOLN
400.0000 mg | Freq: Two times a day (BID) | INTRAVENOUS | Status: DC
  Administered 2012-02-26: 400 mg via INTRAVENOUS
  Filled 2012-02-26 (×2): qty 200

## 2012-02-26 MED ORDER — LACTATED RINGERS IR SOLN
Status: DC | PRN
  Administered 2012-02-26: 1

## 2012-02-26 MED ORDER — OXYCODONE HCL 5 MG/5ML PO SOLN: 5.0000 mg | Freq: Once | ORAL | Status: DC | PRN

## 2012-02-26 MED ORDER — ONDANSETRON HCL 4 MG/2ML IJ SOLN
4.0000 mg | Freq: Four times a day (QID) | INTRAMUSCULAR | Status: DC | PRN
  Administered 2012-02-26: 4 mg via INTRAVENOUS
  Filled 2012-02-26: qty 2

## 2012-02-26 MED ORDER — ACETAMINOPHEN 10 MG/ML IV SOLN
1000.0000 mg | Freq: Once | INTRAVENOUS | Status: DC | PRN
  Filled 2012-02-26: qty 100

## 2012-02-26 MED ORDER — KCL IN DEXTROSE-NACL 20-5-0.45 MEQ/L-%-% IV SOLN
INTRAVENOUS | Status: DC
  Administered 2012-02-26 (×2): via INTRAVENOUS
  Filled 2012-02-26 (×4): qty 1000

## 2012-02-26 MED ORDER — BUPIVACAINE HCL (PF) 0.25 % IJ SOLN
INTRAMUSCULAR | Status: DC | PRN
  Administered 2012-02-26: 15 mL

## 2012-02-26 MED ORDER — GLYCOPYRROLATE 0.2 MG/ML IJ SOLN
INTRAMUSCULAR | Status: DC | PRN
  Administered 2012-02-26: 0.6 mg via INTRAVENOUS

## 2012-02-26 MED ORDER — HYDROCODONE-ACETAMINOPHEN 5-325 MG PO TABS
1.0000 | ORAL_TABLET | ORAL | Status: DC | PRN
  Administered 2012-02-27 – 2012-02-28 (×3): 1 via ORAL
  Filled 2012-02-26 (×3): qty 1

## 2012-02-26 MED ORDER — IRBESARTAN 75 MG PO TABS
75.0000 mg | ORAL_TABLET | Freq: Every day | ORAL | Status: DC
  Administered 2012-02-27 – 2012-02-28 (×2): 75 mg via ORAL
  Filled 2012-02-26 (×2): qty 1

## 2012-02-26 MED ORDER — LACTATED RINGERS IV SOLN
INTRAVENOUS | Status: DC | PRN
  Administered 2012-02-26: 13:00:00 via INTRAVENOUS

## 2012-02-26 MED ORDER — OXYCODONE HCL 5 MG PO TABS: 5.0000 mg | ORAL_TABLET | Freq: Once | ORAL | Status: DC | PRN

## 2012-02-26 MED ORDER — PROPOFOL 10 MG/ML IV BOLUS
INTRAVENOUS | Status: DC | PRN
  Administered 2012-02-26: 140 mg via INTRAVENOUS

## 2012-02-26 MED ORDER — 0.9 % SODIUM CHLORIDE (POUR BTL) OPTIME
TOPICAL | Status: DC | PRN
  Administered 2012-02-26: 1000 mL

## 2012-02-26 MED ORDER — LIDOCAINE-EPINEPHRINE (PF) 1 %-1:200000 IJ SOLN
INTRAMUSCULAR | Status: DC | PRN
  Administered 2012-02-26: 15 mL

## 2012-02-26 MED ORDER — IOHEXOL 300 MG/ML  SOLN
INTRAMUSCULAR | Status: DC | PRN
  Administered 2012-02-26: 7 mL via INTRAVENOUS

## 2012-02-26 MED ORDER — MORPHINE SULFATE 2 MG/ML IJ SOLN
2.0000 mg | INTRAMUSCULAR | Status: DC | PRN
  Administered 2012-02-26 – 2012-02-27 (×3): 2 mg via INTRAVENOUS
  Filled 2012-02-26 (×4): qty 1

## 2012-02-26 MED ORDER — ONDANSETRON HCL 4 MG/2ML IJ SOLN
4.0000 mg | Freq: Four times a day (QID) | INTRAMUSCULAR | Status: DC | PRN
  Administered 2012-02-26 – 2012-02-27 (×2): 4 mg via INTRAVENOUS
  Filled 2012-02-26 (×2): qty 2

## 2012-02-26 MED ORDER — CISATRACURIUM BESYLATE (PF) 10 MG/5ML IV SOLN
INTRAVENOUS | Status: DC | PRN
  Administered 2012-02-26: 2 mg via INTRAVENOUS
  Administered 2012-02-26: 3 mg via INTRAVENOUS

## 2012-02-26 MED ORDER — AMLODIPINE-OLMESARTAN 5-20 MG PO TABS: 1.0000 | ORAL_TABLET | Freq: Every day | ORAL | Status: DC

## 2012-02-26 MED ORDER — GEMFIBROZIL 600 MG PO TABS
600.0000 mg | ORAL_TABLET | Freq: Two times a day (BID) | ORAL | Status: DC
  Administered 2012-02-26 – 2012-02-28 (×4): 600 mg via ORAL
  Filled 2012-02-26 (×7): qty 1

## 2012-02-26 MED ORDER — PROMETHAZINE HCL 25 MG/ML IJ SOLN: 25.0000 mg | Freq: Four times a day (QID) | INTRAMUSCULAR | Status: DC | PRN

## 2012-02-26 MED ORDER — AMLODIPINE BESYLATE 5 MG PO TABS
5.0000 mg | ORAL_TABLET | Freq: Every day | ORAL | Status: DC
  Filled 2012-02-26: qty 1

## 2012-02-26 MED ORDER — IRBESARTAN 150 MG PO TABS
150.0000 mg | ORAL_TABLET | Freq: Every day | ORAL | Status: DC
  Filled 2012-02-26: qty 1

## 2012-02-26 MED ORDER — LIDOCAINE HCL (CARDIAC) 20 MG/ML IV SOLN
INTRAVENOUS | Status: DC | PRN
  Administered 2012-02-26: 50 mg via INTRAVENOUS

## 2012-02-26 MED ORDER — ONDANSETRON HCL 4 MG/2ML IJ SOLN
INTRAMUSCULAR | Status: DC | PRN
  Administered 2012-02-26: 4 mg via INTRAVENOUS

## 2012-02-26 MED ORDER — HYDROMORPHONE HCL PF 1 MG/ML IJ SOLN: 0.2500 mg | INTRAMUSCULAR | Status: DC | PRN

## 2012-02-26 MED ORDER — SUCCINYLCHOLINE CHLORIDE 20 MG/ML IJ SOLN
INTRAMUSCULAR | Status: DC | PRN
  Administered 2012-02-26: 100 mg via INTRAVENOUS

## 2012-02-26 MED ORDER — NEOSTIGMINE METHYLSULFATE 1 MG/ML IJ SOLN
INTRAMUSCULAR | Status: DC | PRN
  Administered 2012-02-26: 4 mg via INTRAVENOUS

## 2012-02-26 MED ORDER — PANTOPRAZOLE SODIUM 40 MG IV SOLR
40.0000 mg | Freq: Two times a day (BID) | INTRAVENOUS | Status: DC
  Administered 2012-02-26: 40 mg via INTRAVENOUS
  Filled 2012-02-26 (×2): qty 40

## 2012-02-26 MED ORDER — AMLODIPINE BESYLATE 2.5 MG PO TABS
2.5000 mg | ORAL_TABLET | Freq: Every day | ORAL | Status: DC
  Administered 2012-02-27 – 2012-02-28 (×2): 2.5 mg via ORAL
  Filled 2012-02-26 (×2): qty 1

## 2012-02-26 MED ORDER — FENTANYL CITRATE 0.05 MG/ML IJ SOLN
INTRAMUSCULAR | Status: DC | PRN
  Administered 2012-02-26 (×3): 50 ug via INTRAVENOUS
  Administered 2012-02-26: 100 ug via INTRAVENOUS

## 2012-02-26 MED ORDER — KCL IN DEXTROSE-NACL 20-5-0.45 MEQ/L-%-% IV SOLN
INTRAVENOUS | Status: DC
  Administered 2012-02-26 – 2012-02-27 (×4): via INTRAVENOUS
  Filled 2012-02-26 (×6): qty 1000

## 2012-02-26 MED ORDER — MEPERIDINE HCL 25 MG/ML IJ SOLN: 6.2500 mg | INTRAMUSCULAR | Status: DC | PRN

## 2012-02-26 MED ORDER — PROMETHAZINE HCL 25 MG/ML IJ SOLN: 6.2500 mg | INTRAMUSCULAR | Status: DC | PRN

## 2012-02-26 SURGICAL SUPPLY — 36 items
APPLIER CLIP LOGIC TI 5 (MISCELLANEOUS) ×2 IMPLANT
APPLIER CLIP ROT 10 11.4 M/L (STAPLE) ×2
CABLE HIGH FREQUENCY MONO STRZ (ELECTRODE) ×2 IMPLANT
CANISTER SUCTION 2500CC (MISCELLANEOUS) ×2 IMPLANT
CHLORAPREP W/TINT 26ML (MISCELLANEOUS) ×2 IMPLANT
CLIP APPLIE ROT 10 11.4 M/L (STAPLE) ×1 IMPLANT
CLOTH BEACON ORANGE TIMEOUT ST (SAFETY) ×2 IMPLANT
COVER SURGICAL LIGHT HANDLE (MISCELLANEOUS) ×2 IMPLANT
DECANTER SPIKE VIAL GLASS SM (MISCELLANEOUS) ×2 IMPLANT
DERMABOND ADVANCED (GAUZE/BANDAGES/DRESSINGS) ×1
DERMABOND ADVANCED .7 DNX12 (GAUZE/BANDAGES/DRESSINGS) ×1 IMPLANT
DRAPE C-ARM 42X72 X-RAY (DRAPES) ×2 IMPLANT
DRAPE LAPAROSCOPIC ABDOMINAL (DRAPES) ×2 IMPLANT
ELECT REM PT RETURN 9FT ADLT (ELECTROSURGICAL) ×2
ELECTRODE REM PT RTRN 9FT ADLT (ELECTROSURGICAL) ×1 IMPLANT
ENDOLOOP SUT PDS II  0 18 (SUTURE)
ENDOLOOP SUT PDS II 0 18 (SUTURE) IMPLANT
GLOVE SURG SS PI 7.5 STRL IVOR (GLOVE) ×4 IMPLANT
GOWN STRL NON-REIN LRG LVL3 (GOWN DISPOSABLE) ×2 IMPLANT
GOWN STRL REIN XL XLG (GOWN DISPOSABLE) ×8 IMPLANT
KIT BASIN OR (CUSTOM PROCEDURE TRAY) ×2 IMPLANT
NS IRRIG 1000ML POUR BTL (IV SOLUTION) ×2 IMPLANT
PENCIL BUTTON HOLSTER BLD 10FT (ELECTRODE) ×2 IMPLANT
POUCH SPECIMEN RETRIEVAL 10MM (ENDOMECHANICALS) ×2 IMPLANT
SCISSORS LAP 5X35 DISP (ENDOMECHANICALS) ×2 IMPLANT
SET CHOLANGIOGRAPH MIX (MISCELLANEOUS) ×2 IMPLANT
SET IRRIG TUBING LAPAROSCOPIC (IRRIGATION / IRRIGATOR) ×2 IMPLANT
STRIP CLOSURE SKIN 1/2X4 (GAUZE/BANDAGES/DRESSINGS) IMPLANT
SUT MNCRL AB 4-0 PS2 18 (SUTURE) ×2 IMPLANT
SUT VICRYL 0 UR6 27IN ABS (SUTURE) ×2 IMPLANT
TOWEL OR 17X26 10 PK STRL BLUE (TOWEL DISPOSABLE) ×2 IMPLANT
TRAY LAP CHOLE (CUSTOM PROCEDURE TRAY) ×2 IMPLANT
TROCAR BALLN 12MMX100 BLUNT (TROCAR) ×2 IMPLANT
TROCAR BLADELESS OPT 5 75 (ENDOMECHANICALS) ×4 IMPLANT
TROCAR XCEL NON-BLD 11X100MML (ENDOMECHANICALS) ×2 IMPLANT
TUBING INSUFFLATION 10FT LAP (TUBING) ×2 IMPLANT

## 2012-02-26 NOTE — Consult Note (Signed)
Unassigned Consult for [name]  Reason for Consult:Anemia Referring Physician: Lodema Pilot, D.O.  Abigail Powell HPI: This is a 61 year old female admitted with persistent nausea.  Further evaluation revealed that she has cholelithiasis, however, with IV hydration her HGB dropped down by 5 grams.  No reports of hematochezia, melena, abdominal pain, hemoptysis, or hematuria.  Earlier in January this year she was admitted and treated for an upper GI bleed.  She reports having an ulcer and she did require a transfusion.  From her description it sounds as if it was secondary to NSAIDs.  She denies being on any PPIs for some time.  During that bleeding event she presented with syncope and melena.  Past Medical History  Diagnosis Date  . Diverticulosis     History reviewed. No pertinent past surgical history.  No family history on file.  Social History:  reports that she has never smoked. She does not have any smokeless tobacco history on file. She reports that she does not drink alcohol. Her drug history not on file.  Allergies:  Allergies  Allergen Reactions  . Asa (Aspirin) Other (See Comments)    Causes ulcer    Medications:  Scheduled:   . amLODipine  5 mg Oral Daily  . gemfibrozil  600 mg Oral BID AC  . irbesartan  150 mg Oral Daily  .  morphine injection  4 mg Intravenous Once  .  morphine injection  6 mg Intravenous Once  . ondansetron      . ondansetron (ZOFRAN) IV  4 mg Intravenous Once  . ondansetron (ZOFRAN) IV  4 mg Intravenous Once  . pantoprazole (PROTONIX) IV  40 mg Intravenous Q12H  . sodium chloride  1,000 mL Intravenous Once  . sodium chloride  1,000 mL Intravenous Once  . DISCONTD: amLODipine-olmesartan  1 tablet Oral Daily   Continuous:   . dextrose 5 % and 0.45 % NaCl with KCl 20 mEq/L 125 mL/hr at 02/26/12 0217    Results for orders placed during the hospital encounter of 02/25/12 (from the past 24 hour(s))  CBC WITH DIFFERENTIAL     Status: Abnormal     Collection Time   02/25/12  1:51 PM      Component Value Range   WBC 10.2  4.0 - 10.5 K/uL   RBC 4.79  3.87 - 5.11 MIL/uL   Hemoglobin 14.5  12.0 - 15.0 g/dL   HCT 16.1  09.6 - 04.5 %   MCV 86.8  78.0 - 100.0 fL   MCH 30.3  26.0 - 34.0 pg   MCHC 34.9  30.0 - 36.0 g/dL   RDW 40.9  81.1 - 91.4 %   Platelets 279  150 - 400 K/uL   Neutrophils Relative 83 (*) 43 - 77 %   Neutro Abs 8.5 (*) 1.7 - 7.7 K/uL   Lymphocytes Relative 12  12 - 46 %   Lymphs Abs 1.2  0.7 - 4.0 K/uL   Monocytes Relative 4  3 - 12 %   Monocytes Absolute 0.4  0.1 - 1.0 K/uL   Eosinophils Relative 2  0 - 5 %   Eosinophils Absolute 0.2  0.0 - 0.7 K/uL   Basophils Relative 0  0 - 1 %   Basophils Absolute 0.0  0.0 - 0.1 K/uL  COMPREHENSIVE METABOLIC PANEL     Status: Abnormal   Collection Time   02/25/12  1:51 PM      Component Value Range   Sodium 135  135 - 145 mEq/L   Potassium 3.6  3.5 - 5.1 mEq/L   Chloride 98  96 - 112 mEq/L   CO2 27  19 - 32 mEq/L   Glucose, Bld 111 (*) 70 - 99 mg/dL   BUN 6  6 - 23 mg/dL   Creatinine, Ser 7.84  0.50 - 1.10 mg/dL   Calcium 9.3  8.4 - 69.6 mg/dL   Total Protein 7.4  6.0 - 8.3 g/dL   Albumin 3.8  3.5 - 5.2 g/dL   AST 14  0 - 37 U/L   ALT 10  0 - 35 U/L   Alkaline Phosphatase 68  39 - 117 U/L   Total Bilirubin 0.2 (*) 0.3 - 1.2 mg/dL   GFR calc non Af Amer 88 (*) >90 mL/min   GFR calc Af Amer >90  >90 mL/min  URINALYSIS, ROUTINE W REFLEX MICROSCOPIC     Status: Abnormal   Collection Time   02/25/12  2:43 PM      Component Value Range   Color, Urine AMBER (*) YELLOW   APPearance CLOUDY (*) CLEAR   Specific Gravity, Urine 1.017  1.005 - 1.030   pH 5.5  5.0 - 8.0   Glucose, UA NEGATIVE  NEGATIVE mg/dL   Hgb urine dipstick NEGATIVE  NEGATIVE   Bilirubin Urine MODERATE (*) NEGATIVE   Ketones, ur TRACE (*) NEGATIVE mg/dL   Protein, ur 295 (*) NEGATIVE mg/dL   Urobilinogen, UA 0.2  0.0 - 1.0 mg/dL   Nitrite NEGATIVE  NEGATIVE   Leukocytes, UA SMALL (*) NEGATIVE   URINE MICROSCOPIC-ADD ON     Status: Abnormal   Collection Time   02/25/12  2:43 PM      Component Value Range   Squamous Epithelial / LPF FEW (*) RARE   WBC, UA 11-20  <3 WBC/hpf   Bacteria, UA FEW (*) RARE  LIPASE, BLOOD     Status: Normal   Collection Time   02/25/12  4:41 PM      Component Value Range   Lipase 27  11 - 59 U/L  COMPREHENSIVE METABOLIC PANEL     Status: Abnormal   Collection Time   02/26/12  5:30 AM      Component Value Range   Sodium 137  135 - 145 mEq/L   Potassium 3.8  3.5 - 5.1 mEq/L   Chloride 110  96 - 112 mEq/L   CO2 15 (*) 19 - 32 mEq/L   Glucose, Bld 120 (*) 70 - 99 mg/dL   BUN 17  6 - 23 mg/dL   Creatinine, Ser 2.84  0.50 - 1.10 mg/dL   Calcium 8.8  8.4 - 13.2 mg/dL   Total Protein 5.4 (*) 6.0 - 8.3 g/dL   Albumin 2.8 (*) 3.5 - 5.2 g/dL   AST 440 (*) 0 - 37 U/L   ALT 157 (*) 0 - 35 U/L   Alkaline Phosphatase 115  39 - 117 U/L   Total Bilirubin 0.6  0.3 - 1.2 mg/dL   GFR calc non Af Amer 56 (*) >90 mL/min   GFR calc Af Amer 65 (*) >90 mL/min  CBC     Status: Abnormal   Collection Time   02/26/12  5:30 AM      Component Value Range   WBC 6.9  4.0 - 10.5 K/uL   RBC 3.09 (*) 3.87 - 5.11 MIL/uL   Hemoglobin 9.4 (*) 12.0 - 15.0 g/dL   HCT 10.2 (*) 72.5 - 36.6 %  MCV 90.6  78.0 - 100.0 fL   MCH 30.4  26.0 - 34.0 pg   MCHC 33.6  30.0 - 36.0 g/dL   RDW 40.9  81.1 - 91.4 %   Platelets 332  150 - 400 K/uL     US Abdomen Complete  02/25/2012  *RADIOLOGY REPORT*  Clinical Data:  Evaluate for cholecystitis  COMPLETE ABDOMINAL ULTRASOUND  Comparison:  Prior CT scan same day  Findings:  Gallbladder:  Distended gallbladder is noted.  Tiny gallstones and sludge noted layering within gallbladder.  No thickening of the gallbladder wall.  No sonographic Murphy's sign.  No pericholecystic fluid.  Common bile duct:  Measures 5 mm in diameter within normal limits.  Liver:  No focal lesion identified.  Within normal limits in parenchymal echogenicity.  IVC:   Appears normal.  Pancreas:  No focal abnormality seen.  Spleen:  Measures 4.2 cm in length.  Normal echogenicity.  Right Kidney:  Measures 11.7 cm in length.  No mass, hydronephrosis or diagnostic renal calculus  Left Kidney:  Measures 10.7 cm in length.  No mass, hydronephrosis or diagnostic renal calculus  Abdominal aorta:  No aneurysm identified. Measures up to 2.3 cm in diameter.  IMPRESSION: 1. Distended gallbladder is noted.  Tiny gallstones and sludge noted layering within gallbladder.  No thickening of the gallbladder wall.  No sonographic Murphy's sign.  No pericholecystic fluid. 2.  Normal CBD. 3.  No hydronephrosis or diagnostic renal calculus.   Original Report Authenticated By: Natasha Mead, M.D.    Ct Abdomen Pelvis W Contrast  02/25/2012  *RADIOLOGY REPORT*  Clinical Data: Left lower quadrant pain, history of diverticulosis  CT ABDOMEN AND PELVIS WITH CONTRAST  Technique:  Multidetector CT imaging of the abdomen and pelvis was performed following the standard protocol during bolus administration of intravenous contrast.  Contrast: 80mL OMNIPAQUE IOHEXOL 300 MG/ML  SOLN  Comparison: None.  Findings: Sagittal images of the spine shows no destructive bony lesions.  Lung bases are unremarkable.  Liver, spleen, pancreas and adrenal glands are unremarkable.  A distended gallbladder is noted.  Layering tiny gallstones are noted within gallbladder the largest measures 2.7 mm.  No pericholecystic fluid.  Kidneys are symmetrical in size and enhancement.  There is a cyst in the mid pole posterior aspect of the right kidney measures 7 mm. A cyst in the lower pole anterior aspect of the left kidney measures 6 mm.  No hydronephrosis or hydroureter.  Delayed renal images shows bilateral renal symmetrical excretion.  Atherosclerotic calcifications of the abdominal aorta and the iliac arteries are noted.  Right colon diverticula are noted without evidence of acute diverticulitis.  No pericecal inflammation.  A normal  appendix is clearly visualized axial image 54.  The terminal ileum is unremarkable.  The urinary bladder is under distended grossly unremarkable.  The uterus and adnexa are not identified.  No destructive bony lesions are noted within pelvis.  Coronal images shows no destructive bony lesions.  Few proximal sigmoid colon diverticula are noted without evidence of acute diverticulitis.  No pelvic ascites or adenopathy.  No inguinal adenopathy.  IMPRESSION:  1.  No small bowel obstruction.  No ascites or free air. 2.  There is a distended gallbladder.  Small layering gallstones are noted within gallbladder.  Largest measures 2.7 mm.  No pericholecystic fluid. 3.  No hydronephrosis or hydroureter.  4.  Colonic diverticula are noted without evidence of acute diverticulitis. 5.  Bilateral small renal cyst. 6.  Normal appendix.  No  pericecal inflammation.   Original Report Authenticated By: Natasha Mead, M.D.     ROS:  As stated above in the HPI otherwise negative.  Blood pressure 100/64, pulse 82, temperature 98.4 F (36.9 C), temperature source Oral, resp. rate 18, height 5\' 2"  (1.575 m), weight 54.8 kg (120 lb 13 oz), SpO2 99.00%.    PE: Gen: NAD, Alert and Oriented HEENT:  [place]/AT, EOMI Neck: Supple, no LAD Lungs: CTA Bilaterally CV: RRR without M/G/R ABM: Soft, NTND, +BS Ext: No C/C/E Rectal: Brown liquid stool, heme negative  Assessment/Plan: 1) Anemia. 2) Symptomatic cholelithiasis 3) History of PUD.   I doubt the patient has a GI bleed.  I think she was dehydrated and hemoconcentrated.  During her GI bleeding episode she did have overt evidence of a GI bleed, but this is not the case.  It is unlikely to lose 5 grams of blood from the GI tract and not have any overt manifestations.  Unfortunately I do not have a baseline HGB, but I would suspect it is not much higher than her current value.    Plan: 1) Okay with cholecystectomy. 2) Monitor HGB and transfuse as necessary.  Everhett Bozard  D 02/26/2012, 10:10 AM

## 2012-02-26 NOTE — Progress Notes (Signed)
Subjective: Still nauseated, and some RUQ pain  Objective: Vital signs in last 24 hours: Temp:  [97.5 F (36.4 C)-98.9 F (37.2 C)] 98.4 F (36.9 C) (10/19 0603) Pulse Rate:  [97-109] 97  (10/19 0603) Resp:  [18-20] 18  (10/19 0603) BP: (96-110)/(52-70) 99/61 mmHg (10/19 0603) SpO2:  [97 %-100 %] 97 % (10/19 0603) Weight:  [120 lb (54.432 kg)-120 lb 13 oz (54.8 kg)] 120 lb 13 oz (54.8 kg) (10/19 0028) Last BM Date: 02/26/12  Intake/Output from previous day:   Intake/Output this shift:    General appearance: alert, cooperative and no distress GI: soft, moderate right sided tenderness, ND, no peritoneal signs  Lab Results:   Basename 02/26/12 0530 02/25/12 1351  WBC 6.9 10.2  HGB 9.4* 14.5  HCT 28.0* 41.6  PLT 332 279   BMET  Basename 02/26/12 0530 02/25/12 1351  NA 137 135  K 3.8 3.6  CL 110 98  CO2 15* 27  GLUCOSE 120* 111*  BUN 17 6  CREATININE 1.04 0.78  CALCIUM 8.8 9.3   PT/INR No results found for this basename: LABPROT:2,INR:2 in the last 72 hours ABG No results found for this basename: PHART:2,PCO2:2,PO2:2,HCO3:2 in the last 72 hours  Studies/Results: US Abdomen Complete  02/25/2012  *RADIOLOGY REPORT*  Clinical Data:  Evaluate for cholecystitis  COMPLETE ABDOMINAL ULTRASOUND  Comparison:  Prior CT scan same day  Findings:  Gallbladder:  Distended gallbladder is noted.  Tiny gallstones and sludge noted layering within gallbladder.  No thickening of the gallbladder wall.  No sonographic Murphy's sign.  No pericholecystic fluid.  Common bile duct:  Measures 5 mm in diameter within normal limits.  Liver:  No focal lesion identified.  Within normal limits in parenchymal echogenicity.  IVC:  Appears normal.  Pancreas:  No focal abnormality seen.  Spleen:  Measures 4.2 cm in length.  Normal echogenicity.  Right Kidney:  Measures 11.7 cm in length.  No mass, hydronephrosis or diagnostic renal calculus  Left Kidney:  Measures 10.7 cm in length.  No mass,  hydronephrosis or diagnostic renal calculus  Abdominal aorta:  No aneurysm identified. Measures up to 2.3 cm in diameter.  IMPRESSION: 1. Distended gallbladder is noted.  Tiny gallstones and sludge noted layering within gallbladder.  No thickening of the gallbladder wall.  No sonographic Murphy's sign.  No pericholecystic fluid. 2.  Normal CBD. 3.  No hydronephrosis or diagnostic renal calculus.   Original Report Authenticated By: Natasha Mead, M.D.    Ct Abdomen Pelvis W Contrast  02/25/2012  *RADIOLOGY REPORT*  Clinical Data: Left lower quadrant pain, history of diverticulosis  CT ABDOMEN AND PELVIS WITH CONTRAST  Technique:  Multidetector CT imaging of the abdomen and pelvis was performed following the standard protocol during bolus administration of intravenous contrast.  Contrast: 80mL OMNIPAQUE IOHEXOL 300 MG/ML  SOLN  Comparison: None.  Findings: Sagittal images of the spine shows no destructive bony lesions.  Lung bases are unremarkable.  Liver, spleen, pancreas and adrenal glands are unremarkable.  A distended gallbladder is noted.  Layering tiny gallstones are noted within gallbladder the largest measures 2.7 mm.  No pericholecystic fluid.  Kidneys are symmetrical in size and enhancement.  There is a cyst in the mid pole posterior aspect of the right kidney measures 7 mm. A cyst in the lower pole anterior aspect of the left kidney measures 6 mm.  No hydronephrosis or hydroureter.  Delayed renal images shows bilateral renal symmetrical excretion.  Atherosclerotic calcifications of the abdominal aorta and  the iliac arteries are noted.  Right colon diverticula are noted without evidence of acute diverticulitis.  No pericecal inflammation.  A normal appendix is clearly visualized axial image 54.  The terminal ileum is unremarkable.  The urinary bladder is under distended grossly unremarkable.  The uterus and adnexa are not identified.  No destructive bony lesions are noted within pelvis.  Coronal images shows  no destructive bony lesions.  Few proximal sigmoid colon diverticula are noted without evidence of acute diverticulitis.  No pelvic ascites or adenopathy.  No inguinal adenopathy.  IMPRESSION:  1.  No small bowel obstruction.  No ascites or free air. 2.  There is a distended gallbladder.  Small layering gallstones are noted within gallbladder.  Largest measures 2.7 mm.  No pericholecystic fluid. 3.  No hydronephrosis or hydroureter.  4.  Colonic diverticula are noted without evidence of acute diverticulitis. 5.  Bilateral small renal cyst. 6.  Normal appendix.  No pericecal inflammation.   Original Report Authenticated By: Natasha Mead, M.D.     Anti-infectives: Anti-infectives    None      Assessment/Plan: s/p * No surgery found * Given her persistent abdominal pain and nausea, we again discussed cholecystectomy.  I think that she would benefit from cholecystectomy.  We again talked about the risks and she desires to proceed. The risks of infection, bleeding, pain, persistent symptoms, scarring, injury to bowel or bile ducts, retained stone, diarrhea, need for additional procedures, and need for open surgery discussed with the patient.  I explained that this could be enteritis or ulcer or other cause as well and she understands.  Will plan for cholecystectomy. Her HGB dropped as well from 14 to 9.  She says that she has not had any blood in stools or black stools but PUD could be a possiblity as well. She said that she did have a bleeding ulcer earlier this year and required transfusion of 1 unit but is not on any ulcer medication and is not taking aspirin or NSAIDS.  I will ask GI to consider EGD and if negative will proceed with cholecystectomy.   LOS: 1 day    Lodema Pilot DAVID 02/26/2012

## 2012-02-26 NOTE — Brief Op Note (Signed)
02/25/2012 - 02/26/2012  1:59 PM  PATIENT:  Abigail Powell  61 y.o. female  PRE-OPERATIVE DIAGNOSIS:  cholecystitis  POST-OPERATIVE DIAGNOSIS:  abdominal pain, cholelithiasis  PROCEDURE:  Procedure(s) (LRB) with comments: LAPAROSCOPIC CHOLECYSTECTOMY WITH INTRAOPERATIVE CHOLANGIOGRAM (N/A)  SURGEON:  Surgeon(s) and Role:    * Lodema Pilot, DO - Primary  PHYSICIAN ASSISTANT:   ASSISTANTS: none   ANESTHESIA:   general  EBL:  Total I/O In: 240 [I.V.:240] Out: 175 [Urine:175]  BLOOD ADMINISTERED:none  DRAINS: none   LOCAL MEDICATIONS USED:  MARCAINE    and LIDOCAINE   SPECIMEN:  Source of Specimen:  GB  DISPOSITION OF SPECIMEN:  PATHOLOGY  COUNTS:  YES  TOURNIQUET:  * No tourniquets in log *  DICTATION: .Other Dictation: Dictation Number [id number]  PLAN OF CARE: Admit to inpatient   PATIENT DISPOSITION:  PACU - hemodynamically stable.   Delay start of Pharmacological VTE agent (>24hrs) due to surgical blood loss or risk of bleeding: no

## 2012-02-26 NOTE — Anesthesia Postprocedure Evaluation (Signed)
Anesthesia Post Note  Patient: Abigail Powell  Procedure(s) Performed: Procedure(s) (LRB): LAPAROSCOPIC CHOLECYSTECTOMY WITH INTRAOPERATIVE CHOLANGIOGRAM (N/A)  Anesthesia type: General  Patient location: PACU  Post pain: Pain level controlled  Post assessment: Post-op Vital signs reviewed  Last Vitals: BP 110/67  Pulse 101  Temp 36.4 C (Oral)  Resp 18  Ht 5\' 2"  (1.575 m)  Wt 120 lb 13 oz (54.8 kg)  BMI 22.10 kg/m2  SpO2 96%  Post vital signs: Reviewed  Level of consciousness: sedated  Complications: No apparent anesthesia complications

## 2012-02-26 NOTE — Transfer of Care (Signed)
Immediate Anesthesia Transfer of Care Note  Patient: Abigail Powell  Procedure(s) Performed: Procedure(s) (LRB) with comments: LAPAROSCOPIC CHOLECYSTECTOMY WITH INTRAOPERATIVE CHOLANGIOGRAM (N/A)  Patient Location: PACU  Anesthesia Type: General  Level of Consciousness: awake, sedated and patient cooperative  Airway & Oxygen Therapy: Patient Spontanous Breathing and Patient connected to face mask oxygen  Post-op Assessment: Report given to PACU RN and Post -op Vital signs reviewed and stable  Post vital signs: Reviewed and stable  Complications: No apparent anesthesia complications

## 2012-02-26 NOTE — Anesthesia Preprocedure Evaluation (Addendum)
Anesthesia Evaluation  Patient identified by MRN, date of birth, ID band Patient awake    Reviewed: Allergy & Precautions, H&P , NPO status , Patient's Chart, lab work & pertinent test results  Airway Mallampati: II TM Distance: >3 FB Neck ROM: Full    Dental  (+) Dental Advisory Given, Edentulous Upper, Poor Dentition, Partial Lower and Missing   Pulmonary neg pulmonary ROS,  breath sounds clear to auscultation  Pulmonary exam normal       Cardiovascular hypertension, Pt. on medications - Past MI and - CHF Rhythm:Regular Rate:Normal     Neuro/Psych negative neurological ROS  negative psych ROS   GI/Hepatic negative GI ROS, Neg liver ROS,   Endo/Other  negative endocrine ROS  Renal/GU negative Renal ROS     Musculoskeletal negative musculoskeletal ROS (+)   Abdominal (+) - obese,   Peds  Hematology  (+) Blood dyscrasia, anemia ,   Anesthesia Other Findings   Reproductive/Obstetrics                       Anesthesia Physical Anesthesia Plan  ASA: II  Anesthesia Plan: General   Post-op Pain Management:    Induction: Intravenous and Rapid sequence  Airway Management Planned: Oral ETT  Additional Equipment:   Intra-op Plan:   Post-operative Plan: Extubation in OR  Informed Consent: I have reviewed the patients History and Physical, chart, labs and discussed the procedure including the risks, benefits and alternatives for the proposed anesthesia with the patient or authorized representative who has indicated his/her understanding and acceptance.   Dental advisory given  Plan Discussed with: CRNA  Anesthesia Plan Comments:         Anesthesia Quick Evaluation

## 2012-02-26 NOTE — Op Note (Signed)
NAMESHANAVIA, MAKELA                  ACCOUNT NO.:  [id number]  MEDICAL RECORD NO.:  [id number]  LOCATION:  1515                         FACILITY:  [facility]  PHYSICIAN:  Lodema Pilot, MD       DATE OF BIRTH:  07-05-1950  DATE OF PROCEDURE:  02/26/2012 DATE OF DISCHARGE:                              OPERATIVE REPORT   PROCEDURE:  Laparoscopic cholecystectomy with intraoperative cholangiogram.  PREOPERATIVE DIAGNOSIS:  Acute cholecystitis and cholelithiasis.  POSTOPERATIVE DIAGNOSIS:  Cholelithiasis.  SURGEON:  Lodema Pilot, MD  ASSISTANT:  None.  ANESTHESIA:  General endotracheal tube anesthesia.  FLUIDS:  800 mL of crystalloid.  ESTIMATED BLOOD LOSS:  Minimal.  DRAINS:  None.  SPECIMENS:  Gallbladder and contents sent to Pathology for permanent sectioning.  COMPLICATIONS:  None.  FINDINGS:  Normal-appearing gallbladder and normal-appearing anatomy. Several small gallstones seen in the cystic duct, but normal cholangiogram.  No other obvious intra-abdominal pathology identified.  INDICATION FOR PROCEDURE:  Ms. Huffstetler is a 61 year old female who comes in with a 18-month history of anorexia and 3-day history of right upper quadrant pain and nausea.  She had ultrasound and CT scan concerning for cholelithiasis and given her persistent symptoms, unrelenting symptoms and physical exam, we offered her cholecystectomy for possible relief. Preoperatively, I asked Dr. Elnoria Howard of Gastroenterology to evaluate for possible EGD preoperatively for possible peptic ulcer disease as a source of symptoms and he felt that this was most likely gallbladder disease and deferred to cholecystectomy.  OPERATIVE DETAILS:  Ms. Ferrara was seen and evaluated in the surgical ward, and risks and benefits of the procedure were discussed in lay terms.  Informed consent was obtained.  She was given prophylactic antibiotics and taken to the operating room, placed on the table in supine position and general  endotracheal tube anesthesia was obtained. Foley catheter was placed and her abdomen was prepped and draped in a standard surgical fashion.  Procedure time-out was performed with all operative team members to confirm proper patient and procedure, and the supraumbilical midline incision was made in the skin and dissection carried down to the subcutaneous tissue using blunt dissection. Abdominal wall fascia was elevated and sharply incised, and the peritoneum entered bluntly.  A 12-mm balloon port was placed and pneumoperitoneum was obtained.  Laparoscope was introduced and there was no evidence of bowel injury upon entry.  I placed two 5-mm right upper quadrant trocars and an 11-mm epigastric trocar under direct visualization.  The gallbladder was retracted cephalad.  She had normal- appearing anatomy, it was very thin and we could actually see her hepatic artery, and there was not any significant inflammatory changes. Because of this, I looked around through the abdomen, I did not see any other obvious inflammatory changes or masses and we proceeded with laparoscopic cholecystectomy.  The peritoneum was taken down bluntly. The cystic artery was easily identified and clips were placed on the cystic artery, two on the stay side and one on the distal side.  The cystic duct was skeletonized as well and the critical view of safety was obtained visualizing the liver parenchyma through the triangle of Calot with a single cystic duct and single cystic  artery.  She appeared to have some small gallstones in the cystic duct and these were milked back up into the gallbladder and a clip was placed on the gallbladder side of the cystic duct and a cholangiogram was performed by passing a cholangiogram catheter through a separate stab incision on the abdominal wall and a small cystic ductotomy was made and then cholangiogram was performed, which demonstrated normal flow of bile into the duodenum  with normal right and left hepatic ducts and no common bile duct filling defects.  The cholangiogram catheter was removed and three clips were placed on the cystic duct stump.  The cystic duct stump appeared very long and clearly identified.  The duct was transected and the previously clipped artery was also transected at this time.  She also had a fairly long mesentery on the gallbladder and dissection from the gallbladder fossa was fairly easy as well and the gallbladder was not entered during the dissection.  The gallbladder was completely removed from the gallbladder fossa and placed in Endocatch bag and removed from the umbilical trocar site.  She had some small palpable gallstones and some sludge.  The specimen was sent to Pathology for permanent sectioning. The right upper quadrant was suctioned from any excess fluid, but there was no significant spillage of bile or bleeding.  The clips appeared in good position and the gallbladder fossa was hemostatic.  I inspected the stomach as well, and there was no obvious abnormalities of the stomach. I removed the right upper quadrant trocars under direct visualization, and abdominal wall was noted to be hemostatic, and approximated the umbilical fascia with interrupted 0 Vicryl sutures in open fashion. Sutures were secured and the abdomen was re-insufflated with carbon dioxide gas and the abdominal wall closure was noted to be adequate without any evidence of bowel injury.  The final trocar was removed and the wounds were injected with total of 30 mL of 1% lidocaine with epinephrine and 0.25% Marcaine in 50:50 mixture, and the skin edges were approximated with 4-0 Monocryl subcuticular suture.  Skin was washed and dried and Dermabond was applied.  Foley catheter was removed at the end of the case.  The patient tolerated the procedure well, and all sponge, needle, and instrument counts were correct in the case.           ______________________________ Lodema Pilot, MD     BL/MEDQ  D:  02/26/2012  T:  02/26/2012  Job:  [id number]

## 2012-02-26 NOTE — Plan of Care (Signed)
Problem: Consults Goal: General Medical Patient Education See Patient Education Module for specific education.  Outcome: Completed/Met Date Met:  02/26/12 Surgical consult complete

## 2012-02-27 LAB — COMPREHENSIVE METABOLIC PANEL
AST: 117 U/L — ABNORMAL HIGH (ref 0–37)
Albumin: 2.7 g/dL — ABNORMAL LOW (ref 3.5–5.2)
Calcium: 8.7 mg/dL (ref 8.4–10.5)
Creatinine, Ser: 1.05 mg/dL (ref 0.50–1.10)

## 2012-02-27 LAB — CBC
Hemoglobin: 9.4 g/dL — ABNORMAL LOW (ref 12.0–15.0)
MCH: 30.3 pg (ref 26.0–34.0)
MCV: 91 fL (ref 78.0–100.0)
RBC: 3.1 MIL/uL — ABNORMAL LOW (ref 3.87–5.11)
WBC: 7.4 10*3/uL (ref 4.0–10.5)

## 2012-02-27 MED ORDER — ENSURE COMPLETE PO LIQD
237.0000 mL | Freq: Two times a day (BID) | ORAL | Status: DC
  Administered 2012-02-27 – 2012-02-28 (×3): 237 mL via ORAL

## 2012-02-27 MED ORDER — PROMETHAZINE HCL 25 MG/ML IJ SOLN
25.0000 mg | Freq: Four times a day (QID) | INTRAMUSCULAR | Status: DC | PRN
  Administered 2012-02-27: 25 mg via INTRAMUSCULAR
  Filled 2012-02-27: qty 1

## 2012-02-27 NOTE — Progress Notes (Signed)
1 Day Post-Op  Subjective: Was feeling good most of the day yesterday but did have some nausea last night with vomiting.  Nausea this am.  No complaints of pain. Objective: Vital signs in last 24 hours: Temp:  [97.6 F (36.4 C)-98.5 F (36.9 C)] 98.5 F (36.9 C) (10/20 0600) Pulse Rate:  [82-107] 84  (10/20 0600) Resp:  [10-18] 18  (10/20 0600) BP: (99-123)/(50-70) 102/68 mmHg (10/20 0600) SpO2:  [92 %-100 %] 96 % (10/20 0600) Last BM Date: 02/25/12  Intake/Output from previous day: 10/19 0701 - 10/20 0700 In: 2510 [I.V.:2510] Out: 675 [Urine:675] Intake/Output this shift:    General appearance: alert, cooperative and no distress Resp: nonlabored Cardio: normal rate, regular  GI: soft, minimal incisional tenderness, ND, wounds without infection, no peritoneal signs  Lab Results:   Basename 02/27/12 0537 02/26/12 1423  WBC 7.4 6.2  HGB 9.4* 9.6*  HCT 28.2* 28.8*  PLT 310 305   BMET  Basename 02/27/12 0537 02/26/12 0530  NA 134* 137  K 3.6 3.8  CL 107 110  CO2 14* 15*  GLUCOSE 140* 120*  BUN 7 17  CREATININE 1.05 1.04  CALCIUM 8.7 8.8   PT/INR No results found for this basename: LABPROT:2,INR:2 in the last 72 hours ABG No results found for this basename: PHART:2,PCO2:2,PO2:2,HCO3:2 in the last 72 hours  Studies/Results: Dg Cholangiogram Operative  02/26/2012  *RADIOLOGY REPORT*  Clinical Data:   Cholecystectomy  INTRAOPERATIVE CHOLANGIOGRAM  Technique:  Cholangiographic images from the C-arm fluoroscopic device were submitted for interpretation post-operatively.  Please see the procedural report for the amount of contrast and the fluoroscopy time utilized.  Comparison:  None.  Findings:  Bile ducts are opacified with contrast.  Common bile duct is normal in caliber and patent to the duodenum.  No retained stone or stricture.  No biliary leak.  IMPRESSION: Negative   Original Report Authenticated By: Camelia Phenes, M.D.    US Abdomen Complete  02/25/2012   *RADIOLOGY REPORT*  Clinical Data:  Evaluate for cholecystitis  COMPLETE ABDOMINAL ULTRASOUND  Comparison:  Prior CT scan same day  Findings:  Gallbladder:  Distended gallbladder is noted.  Tiny gallstones and sludge noted layering within gallbladder.  No thickening of the gallbladder wall.  No sonographic Murphy's sign.  No pericholecystic fluid.  Common bile duct:  Measures 5 mm in diameter within normal limits.  Liver:  No focal lesion identified.  Within normal limits in parenchymal echogenicity.  IVC:  Appears normal.  Pancreas:  No focal abnormality seen.  Spleen:  Measures 4.2 cm in length.  Normal echogenicity.  Right Kidney:  Measures 11.7 cm in length.  No mass, hydronephrosis or diagnostic renal calculus  Left Kidney:  Measures 10.7 cm in length.  No mass, hydronephrosis or diagnostic renal calculus  Abdominal aorta:  No aneurysm identified. Measures up to 2.3 cm in diameter.  IMPRESSION: 1. Distended gallbladder is noted.  Tiny gallstones and sludge noted layering within gallbladder.  No thickening of the gallbladder wall.  No sonographic Murphy's sign.  No pericholecystic fluid. 2.  Normal CBD. 3.  No hydronephrosis or diagnostic renal calculus.   Original Report Authenticated By: Natasha Mead, M.D.    Ct Abdomen Pelvis W Contrast  02/25/2012  *RADIOLOGY REPORT*  Clinical Data: Left lower quadrant pain, history of diverticulosis  CT ABDOMEN AND PELVIS WITH CONTRAST  Technique:  Multidetector CT imaging of the abdomen and pelvis was performed following the standard protocol during bolus administration of intravenous contrast.  Contrast:  80mL OMNIPAQUE IOHEXOL 300 MG/ML  SOLN  Comparison: None.  Findings: Sagittal images of the spine shows no destructive bony lesions.  Lung bases are unremarkable.  Liver, spleen, pancreas and adrenal glands are unremarkable.  A distended gallbladder is noted.  Layering tiny gallstones are noted within gallbladder the largest measures 2.7 mm.  No pericholecystic fluid.   Kidneys are symmetrical in size and enhancement.  There is a cyst in the mid pole posterior aspect of the right kidney measures 7 mm. A cyst in the lower pole anterior aspect of the left kidney measures 6 mm.  No hydronephrosis or hydroureter.  Delayed renal images shows bilateral renal symmetrical excretion.  Atherosclerotic calcifications of the abdominal aorta and the iliac arteries are noted.  Right colon diverticula are noted without evidence of acute diverticulitis.  No pericecal inflammation.  A normal appendix is clearly visualized axial image 54.  The terminal ileum is unremarkable.  The urinary bladder is under distended grossly unremarkable.  The uterus and adnexa are not identified.  No destructive bony lesions are noted within pelvis.  Coronal images shows no destructive bony lesions.  Few proximal sigmoid colon diverticula are noted without evidence of acute diverticulitis.  No pelvic ascites or adenopathy.  No inguinal adenopathy.  IMPRESSION:  1.  No small bowel obstruction.  No ascites or free air. 2.  There is a distended gallbladder.  Small layering gallstones are noted within gallbladder.  Largest measures 2.7 mm.  No pericholecystic fluid. 3.  No hydronephrosis or hydroureter.  4.  Colonic diverticula are noted without evidence of acute diverticulitis. 5.  Bilateral small renal cyst. 6.  Normal appendix.  No pericecal inflammation.   Original Report Authenticated By: Natasha Mead, M.D.     Anti-infectives: Anti-infectives     Start     Dose/Rate Route Frequency Ordered Stop   02/26/12 1245   ciprofloxacin (CIPRO) IVPB 400 mg  Status:  Discontinued        400 mg 200 mL/hr over 60 Minutes Intravenous Every 12 hours 02/26/12 1236 02/26/12 1537          Assessment/Plan: s/p Procedure(s) (LRB) with comments: LAPAROSCOPIC CHOLECYSTECTOMY WITH INTRAOPERATIVE CHOLANGIOGRAM (N/A) Her pain is improved but she still has nausea.  She can eat what she wants but will make NPO after midnight  because if she is nauseated still tomorrow, then I would recommend EGD tomorrow since her gallbladder looked okay.  LOS: 2 days    Lodema Pilot DAVID 02/27/2012

## 2012-02-27 NOTE — Progress Notes (Signed)
INITIAL ADULT NUTRITION ASSESSMENT Date: 02/27/2012   Time: 3:27 PM Reason for Assessment: Nutrition Risk  ASSESSMENT: Female 61 y.o.  Dx: persistent nausea secondary to cholelithiasis, anemia, s/pLAPAROSCOPIC CHOLECYSTECTOMY WITH INTRAOPERATIVE CHOLANGIOGRAM 10/19   Hx:  Past Medical History  Diagnosis Date  . Diverticulosis    History reviewed. No pertinent past surgical history.  Related Meds:     . amLODipine  2.5 mg Oral Daily  . enoxaparin  40 mg Subcutaneous Q24H  . feeding supplement  237 mL Oral BID BM  . gemfibrozil  600 mg Oral BID AC  . irbesartan  75 mg Oral Daily  . DISCONTD: amLODipine  5 mg Oral Daily  . DISCONTD: ciprofloxacin  400 mg Intravenous Q12H  . DISCONTD: irbesartan  150 mg Oral Daily  . DISCONTD: pantoprazole (PROTONIX) IV  40 mg Intravenous Q12H     Ht: 5\' 2"  (157.5 cm)  Wt: 120 lb 13 oz (54.8 kg)  Ideal Wt: 50.1 kg  % Ideal Wt: 109  Usual Wt: 145#  % Usual Wt: 86  Body mass index is 22.10 kg/(m^2).  Labs:  CMP     Component Value Date/Time   NA 134* 02/27/2012 0537   K 3.6 02/27/2012 0537   CL 107 02/27/2012 0537   CO2 14* 02/27/2012 0537   GLUCOSE 140* 02/27/2012 0537   BUN 7 02/27/2012 0537   CREATININE 1.05 02/27/2012 0537   CALCIUM 8.7 02/27/2012 0537   PROT 5.5* 02/27/2012 0537   ALBUMIN 2.7* 02/27/2012 0537   AST 117* 02/27/2012 0537   ALT 150* 02/27/2012 0537   ALKPHOS 147* 02/27/2012 0537   BILITOT 0.9 02/27/2012 0537   GFRNONAA 56* 02/27/2012 0537   GFRAA 65* 02/27/2012 0537    I/O last 3 completed shifts: In: 2510 [I.V.:2510] Out: 675 [Urine:675] Total I/O In: 816.7 [I.V.:816.7] Out: 350 [Urine:300; Emesis/NG output:50]   Diet Order: General  Supplements/Tube Feeding: none  IVF:    dextrose 5 % and 0.45 % NaCl with KCl 20 mEq/L Last Rate: 100 mL/hr at 02/27/12 1145  DISCONTD: dextrose 5 % and 0.45 % NaCl with KCl 20 mEq/L Last Rate: Stopped (02/26/12 1230)    Estimated Nutritional  Needs:   Kcal: 1400-1500 Protein: 65-75 gm Fluid: >1.4L  Food/Nutrition Related Hx: Pt moved down from [place] 3 weeks ago to live with daughter and be close to her grandson. Symptoms started after move.  Pt with poor intake for the last 3 weeks and a 25# weight loss.  Partial physical exam shows temporal wasting, decreased muscle mass and body fat on upper body.  Tolerating regular diet.  Taking small amounts thus far.  Has not tried Ensure yet.  Pt meets criteria for severe malnutrition related to acute illness AEB intake <75% meals for the past 3 weeks and 14% weight loss as well as decreased muscle mass and body fat.  NUTRITION DIAGNOSIS: -Inadequate oral intake (NI-2.1).  Status: Ongoing  RELATED TO: decreased appetite   AS EVIDENCE BY: reported poor intake, weight loss prior to admit  MONITORING/EVALUATION(Goals): Intake, weight, labs Goal:  Intake of >75% meals and supplements  EDUCATION NEEDS: -No education needs identified at this time  INTERVENTION: Continue current diet with ensure complete bid.  Dietitian [phone]  DOCUMENTATION CODES Per approved criteria  -Severe malnutrition in the context of acute illness or injury    Shakenna Herrero, Anastasia Fiedler 02/27/2012, 3:27 PM

## 2012-02-27 NOTE — Progress Notes (Signed)
Patient c/o nausea and vomiting and does not have PRN medications ordered to relieve symptoms.  Patient has not voided since surgery.  Bladder scanner read >300 mL of retained urine.  MD notified.  New orders obtained.

## 2012-02-28 ENCOUNTER — Ambulatory Visit: Payer: BC Managed Care – HMO | Admitting: Medical

## 2012-02-28 ENCOUNTER — Encounter (HOSPITAL_COMMUNITY): Payer: Self-pay | Admitting: General Surgery

## 2012-02-28 LAB — CBC WITH DIFFERENTIAL/PLATELET
Basophils Absolute: 0 10*3/uL (ref 0.0–0.1)
Basophils Relative: 0 % (ref 0–1)
Eosinophils Absolute: 0.1 10*3/uL (ref 0.0–0.7)
Hemoglobin: 8.8 g/dL — ABNORMAL LOW (ref 12.0–15.0)
MCH: 30.6 pg (ref 26.0–34.0)
MCHC: 33.5 g/dL (ref 30.0–36.0)
Monocytes Relative: 17 % — ABNORMAL HIGH (ref 3–12)
Neutro Abs: 2.9 10*3/uL (ref 1.7–7.7)
Neutrophils Relative %: 59 % (ref 43–77)
Platelets: 263 10*3/uL (ref 150–400)
RDW: 12.9 % (ref 11.5–15.5)

## 2012-02-28 LAB — COMPREHENSIVE METABOLIC PANEL
ALT: 125 U/L — ABNORMAL HIGH (ref 0–35)
CO2: 13 mEq/L — ABNORMAL LOW (ref 19–32)
Calcium: 8.4 mg/dL (ref 8.4–10.5)
Chloride: 109 mEq/L (ref 96–112)
GFR calc Af Amer: 74 mL/min — ABNORMAL LOW (ref >90)
GFR calc non Af Amer: 64 mL/min — ABNORMAL LOW (ref >90)
Glucose, Bld: 128 mg/dL — ABNORMAL HIGH (ref 70–99)
Sodium: 134 mEq/L — ABNORMAL LOW (ref 135–145)
Total Bilirubin: 1.1 mg/dL (ref 0.3–1.2)

## 2012-02-28 MED ORDER — HYDROCODONE-ACETAMINOPHEN 5-325 MG PO TABS: 1.0000 | ORAL_TABLET | ORAL | Status: DC | PRN

## 2012-02-28 NOTE — Progress Notes (Signed)
DISCHARGE INSTRUCTIONS GIVEN TO PT, VERBALIZED UNDERSTANDING. LEFT THE UNIT IN STABLE CONDITION.

## 2012-02-28 NOTE — Discharge Summary (Signed)
Patient ID: Abigail Powell MRN: [id number] DOB/AGE: 1951-04-02 61 y.o.  Admit date: 02/25/2012 Discharge date: 02/28/2012  Procedures: laparoscopic cholecystectomy with IOC  Consults: GI  Reason for Admission: I was asked to evaluate this patient for cholelithiasis and abdominal pain. She was seen about one month ago in the emergency room for some right flank pain and was treated for a urinary tract infection. Her symptoms seem to get better but over the last month she has had anorexia. Over the last 3-4 days she has had increasing nausea with some abdominal pain which she describes as a 5/10 pain located mainly in the right upper quadrant. She's also had diarrhea and some chills and some subjective fevers she has not measured her temperature. She denies any heartburn or reflux and says that she is healthy other than a history of hypertension  Admission Diagnoses:  1. Symptomatic cholelithiasis  Hospital Course: the patient was admitted and made NPO after midnight for plans for lap chole the following day.  Her hgb dropped from 14 to 9 and given her h/o PUD, GI was asked to see prior to surgery to make sure that the cause of her nausea and pain wasn't GI related.  Hemoccults were obtained, which were negative and this was felt to be unlikely related to an ulcer.  Therefore, we proceeded with a lap chole.  The patient tolerated this well.  On POD#1, she had some nausea, but this quickly resolved.  By POD# 2, she was tolerating a regular diet with minimal pain and ready for dc home.  PE; Abd: soft, minimally tender, +BS, ND, incisions c/d/i   Discharge Diagnoses:  1. Cholelithiasis 2. S/p lap chole 3. Anemia 4. H/o PUD  Discharge Medications:   Medication List     As of 02/28/2012  9:27 AM    STOP taking these medications         HYDROcodone-acetaminophen 5-500 MG per tablet   Commonly known as: VICODIN   Replaced by: HYDROcodone-acetaminophen 5-325 MG per tablet      TAKE these  medications         AZOR 5-20 MG per tablet   Generic drug: amLODipine-olmesartan   Take 1 tablet by mouth daily.      cephALEXin 500 MG capsule   Commonly known as: KEFLEX   Take 500 mg by mouth 4 (four) times daily.      fish oil-omega-3 fatty acids 1000 MG capsule   Take 1 g by mouth daily.      gemfibrozil 600 MG tablet   Commonly known as: LOPID   Take 600 mg by mouth 2 (two) times daily before a meal.      HYDROcodone-acetaminophen 5-325 MG per tablet   Commonly known as: NORCO/VICODIN   Take 1-2 tablets by mouth every 4 (four) hours as needed.      VITAMIN C PO   Take 1 tablet by mouth daily.      VITAMIN D PO   Take 1 tablet by mouth daily.      VITAMIN E PO   Take 1 capsule by mouth daily.        Discharge Instructions:     Follow-up Information    Follow up with [facility]. On 03/14/2012. (2:45pm, arrive at 2:15pm)    Contact information:   [address]   [place] [address] [phone]        Dr. Jeani Hawking in 2-3 weeks for follow up  on PUD.  Signed: Bay Jarquin E 02/28/2012, 9:27 AM

## 2012-03-01 ENCOUNTER — Emergency Department (HOSPITAL_COMMUNITY): Payer: BC Managed Care – HMO

## 2012-03-01 ENCOUNTER — Encounter (HOSPITAL_COMMUNITY): Payer: Self-pay | Admitting: Emergency Medicine

## 2012-03-01 ENCOUNTER — Inpatient Hospital Stay (HOSPITAL_COMMUNITY)
Admission: EM | Admit: 2012-03-01 | Discharge: 2012-03-14 | DRG: 533 | Disposition: A | Discharge status: Skilled Nursing Facility | Payer: BC Managed Care – HMO | Attending: Internal Medicine | Admitting: Internal Medicine

## 2012-03-01 DIAGNOSIS — R5381 Other malaise: Secondary | ICD-10-CM | POA: Diagnosis present

## 2012-03-01 DIAGNOSIS — Z79899 Other long term (current) drug therapy: Secondary | ICD-10-CM

## 2012-03-01 DIAGNOSIS — G610 Guillain-Barre syndrome: Secondary | ICD-10-CM | POA: Diagnosis present

## 2012-03-01 DIAGNOSIS — R112 Nausea with vomiting, unspecified: Secondary | ICD-10-CM | POA: Diagnosis present

## 2012-03-01 DIAGNOSIS — R7401 Elevation of levels of liver transaminase levels: Secondary | ICD-10-CM | POA: Diagnosis present

## 2012-03-01 DIAGNOSIS — D72829 Elevated white blood cell count, unspecified: Secondary | ICD-10-CM | POA: Diagnosis present

## 2012-03-01 DIAGNOSIS — E440 Moderate protein-calorie malnutrition: Secondary | ICD-10-CM | POA: Diagnosis present

## 2012-03-01 DIAGNOSIS — R000 Tachycardia, unspecified: Secondary | ICD-10-CM | POA: Diagnosis present

## 2012-03-01 DIAGNOSIS — E872 Acidosis, unspecified: Secondary | ICD-10-CM | POA: Diagnosis present

## 2012-03-01 DIAGNOSIS — D4989 Neoplasm of unspecified behavior of other specified sites: Secondary | ICD-10-CM | POA: Diagnosis present

## 2012-03-01 DIAGNOSIS — IMO0002 Reserved for concepts with insufficient information to code with codable children: Secondary | ICD-10-CM

## 2012-03-01 DIAGNOSIS — E876 Hypokalemia: Secondary | ICD-10-CM | POA: Diagnosis present

## 2012-03-01 DIAGNOSIS — R531 Weakness: Secondary | ICD-10-CM

## 2012-03-01 DIAGNOSIS — N390 Urinary tract infection, site not specified: Secondary | ICD-10-CM | POA: Diagnosis present

## 2012-03-01 DIAGNOSIS — R4182 Altered mental status, unspecified: Secondary | ICD-10-CM

## 2012-03-01 DIAGNOSIS — R7402 Elevation of levels of lactic acid dehydrogenase (LDH): Secondary | ICD-10-CM | POA: Diagnosis present

## 2012-03-01 DIAGNOSIS — F1021 Alcohol dependence, in remission: Secondary | ICD-10-CM | POA: Diagnosis present

## 2012-03-01 DIAGNOSIS — J189 Pneumonia, unspecified organism: Secondary | ICD-10-CM | POA: Diagnosis present

## 2012-03-01 DIAGNOSIS — Z23 Encounter for immunization: Secondary | ICD-10-CM

## 2012-03-01 DIAGNOSIS — R197 Diarrhea, unspecified: Secondary | ICD-10-CM | POA: Diagnosis present

## 2012-03-01 DIAGNOSIS — R29898 Other symptoms and signs involving the musculoskeletal system: Secondary | ICD-10-CM | POA: Diagnosis present

## 2012-03-01 DIAGNOSIS — E0590 Thyrotoxicosis, unspecified without thyrotoxic crisis or storm: Secondary | ICD-10-CM | POA: Diagnosis present

## 2012-03-01 DIAGNOSIS — E785 Hyperlipidemia, unspecified: Secondary | ICD-10-CM | POA: Diagnosis present

## 2012-03-01 DIAGNOSIS — R627 Adult failure to thrive: Secondary | ICD-10-CM | POA: Diagnosis present

## 2012-03-01 DIAGNOSIS — D638 Anemia in other chronic diseases classified elsewhere: Secondary | ICD-10-CM | POA: Diagnosis present

## 2012-03-01 DIAGNOSIS — F1010 Alcohol abuse, uncomplicated: Secondary | ICD-10-CM | POA: Diagnosis present

## 2012-03-01 DIAGNOSIS — I82409 Acute embolism and thrombosis of unspecified deep veins of unspecified lower extremity: Secondary | ICD-10-CM | POA: Diagnosis present

## 2012-03-01 DIAGNOSIS — R5383 Other fatigue: Secondary | ICD-10-CM

## 2012-03-01 DIAGNOSIS — T50995A Adverse effect of other drugs, medicaments and biological substances, initial encounter: Secondary | ICD-10-CM | POA: Diagnosis present

## 2012-03-01 DIAGNOSIS — G8250 Quadriplegia, unspecified: Secondary | ICD-10-CM | POA: Diagnosis present

## 2012-03-01 DIAGNOSIS — K5730 Diverticulosis of large intestine without perforation or abscess without bleeding: Secondary | ICD-10-CM | POA: Diagnosis present

## 2012-03-01 DIAGNOSIS — F29 Unspecified psychosis not due to a substance or known physiological condition: Secondary | ICD-10-CM | POA: Diagnosis present

## 2012-03-01 DIAGNOSIS — G9340 Encephalopathy, unspecified: Secondary | ICD-10-CM | POA: Diagnosis present

## 2012-03-01 DIAGNOSIS — I10 Essential (primary) hypertension: Secondary | ICD-10-CM | POA: Diagnosis present

## 2012-03-01 HISTORY — DX: Essential (primary) hypertension: I10

## 2012-03-01 HISTORY — DX: Personal history of peptic ulcer disease: Z87.11

## 2012-03-01 HISTORY — DX: Personal history of other diseases of the digestive system: Z87.19

## 2012-03-01 HISTORY — DX: Benign neoplasm of unspecified part of unspecified eye: D31.90

## 2012-03-01 LAB — COMPREHENSIVE METABOLIC PANEL
ALT: 140 U/L — ABNORMAL HIGH (ref 0–35)
AST: 119 U/L — ABNORMAL HIGH (ref 0–37)
Albumin: 3.5 g/dL (ref 3.5–5.2)
Alkaline Phosphatase: 203 U/L — ABNORMAL HIGH (ref 39–117)
BUN: 7 mg/dL (ref 6–23)
Chloride: 106 mEq/L (ref 96–112)
Potassium: 3.6 mEq/L (ref 3.5–5.1)
Sodium: 140 mEq/L (ref 135–145)
Total Bilirubin: 0.8 mg/dL (ref 0.3–1.2)

## 2012-03-01 LAB — D-DIMER, QUANTITATIVE: D-Dimer, Quant: 0.68 ug/mL-FEU — ABNORMAL HIGH (ref 0.00–0.48)

## 2012-03-01 LAB — CBC WITH DIFFERENTIAL/PLATELET
Basophils Relative: 0 % (ref 0–1)
Hemoglobin: 14.8 g/dL (ref 12.0–15.0)
MCHC: 33.5 g/dL (ref 30.0–36.0)
Monocytes Relative: 7 % (ref 3–12)
Neutro Abs: 4.8 10*3/uL (ref 1.7–7.7)
Neutrophils Relative %: 84 % — ABNORMAL HIGH (ref 43–77)
Platelets: 270 10*3/uL (ref 150–400)
RBC: 4.97 MIL/uL (ref 3.87–5.11)

## 2012-03-01 LAB — GLUCOSE, CAPILLARY: Glucose-Capillary: 72 mg/dL (ref 70–99)

## 2012-03-01 LAB — URINALYSIS, ROUTINE W REFLEX MICROSCOPIC
Glucose, UA: NEGATIVE mg/dL
Hgb urine dipstick: NEGATIVE
Ketones, ur: 15 mg/dL — AB
Specific Gravity, Urine: 1.014 (ref 1.005–1.030)
pH: 6 (ref 5.0–8.0)

## 2012-03-01 LAB — AMMONIA: Ammonia: 27 umol/L (ref 11–60)

## 2012-03-01 LAB — URINE MICROSCOPIC-ADD ON

## 2012-03-01 LAB — LIPASE, BLOOD: Lipase: 235 U/L — ABNORMAL HIGH (ref 11–59)

## 2012-03-01 MED ORDER — IRBESARTAN 150 MG PO TABS
150.0000 mg | ORAL_TABLET | Freq: Every day | ORAL | Status: DC
  Administered 2012-03-02 – 2012-03-14 (×12): 150 mg via ORAL
  Filled 2012-03-01 (×14): qty 1

## 2012-03-01 MED ORDER — ONDANSETRON HCL 4 MG PO TABS: 4.0000 mg | ORAL_TABLET | Freq: Four times a day (QID) | ORAL | Status: DC | PRN

## 2012-03-01 MED ORDER — OXYCODONE HCL 5 MG PO TABS: 5.0000 mg | ORAL_TABLET | ORAL | Status: DC | PRN

## 2012-03-01 MED ORDER — ONDANSETRON HCL 4 MG/2ML IJ SOLN
4.0000 mg | Freq: Four times a day (QID) | INTRAMUSCULAR | Status: DC | PRN
  Administered 2012-03-02: 4 mg via INTRAVENOUS
  Filled 2012-03-01: qty 2

## 2012-03-01 MED ORDER — ACETAMINOPHEN 325 MG PO TABS
650.0000 mg | ORAL_TABLET | Freq: Four times a day (QID) | ORAL | Status: DC | PRN
  Administered 2012-03-10 – 2012-03-13 (×2): 650 mg via ORAL
  Filled 2012-03-01 (×4): qty 2

## 2012-03-01 MED ORDER — SODIUM CHLORIDE 0.9 % IV BOLUS (SEPSIS)
1000.0000 mL | Freq: Once | INTRAVENOUS | Status: AC
  Administered 2012-03-01: 1000 mL via INTRAVENOUS

## 2012-03-01 MED ORDER — ONDANSETRON HCL 4 MG/2ML IJ SOLN
4.0000 mg | INTRAMUSCULAR | Status: AC
  Administered 2012-03-01: 4 mg via INTRAVENOUS
  Filled 2012-03-01: qty 2

## 2012-03-01 MED ORDER — AMLODIPINE BESYLATE 5 MG PO TABS
5.0000 mg | ORAL_TABLET | Freq: Every day | ORAL | Status: DC
  Administered 2012-03-02 – 2012-03-14 (×12): 5 mg via ORAL
  Filled 2012-03-01 (×14): qty 1

## 2012-03-01 MED ORDER — IOHEXOL 300 MG/ML  SOLN
80.0000 mL | Freq: Once | INTRAMUSCULAR | Status: AC | PRN
  Administered 2012-03-01: 80 mL via INTRAVENOUS

## 2012-03-01 MED ORDER — AMLODIPINE-OLMESARTAN 5-20 MG PO TABS: 1.0000 | ORAL_TABLET | Freq: Every day | ORAL | Status: DC

## 2012-03-01 MED ORDER — POTASSIUM CHLORIDE IN NACL 20-0.9 MEQ/L-% IV SOLN
INTRAVENOUS | Status: DC
  Administered 2012-03-01: 23:00:00 via INTRAVENOUS
  Administered 2012-03-02: 75 mL/h via INTRAVENOUS
  Administered 2012-03-03: 01:00:00 via INTRAVENOUS
  Filled 2012-03-01 (×5): qty 1000

## 2012-03-01 MED ORDER — SODIUM CHLORIDE 0.9 % IJ SOLN
3.0000 mL | Freq: Two times a day (BID) | INTRAMUSCULAR | Status: DC
  Administered 2012-03-04 – 2012-03-09 (×4): 3 mL via INTRAVENOUS
  Administered 2012-03-09: 10 mL via INTRAVENOUS

## 2012-03-01 MED ORDER — ACETAMINOPHEN 650 MG RE SUPP
650.0000 mg | Freq: Four times a day (QID) | RECTAL | Status: DC | PRN
  Administered 2012-03-11 – 2012-03-12 (×2): 650 mg via RECTAL
  Filled 2012-03-01 (×3): qty 1

## 2012-03-01 NOTE — ED Provider Notes (Signed)
History     CSN: [id number]  Arrival date & time 03/01/12  1056   First MD Initiated Contact with Patient 03/01/12 1119      Chief Complaint  Patient presents with  . Fatigue    (Consider location/radiation/quality/duration/timing/severity/associated sxs/prior treatment) The history is provided by the patient, a relative and medical records.    Abigail Powell is a 61 y.o. female presents to the emergency department complaining of weakness and fatigue.  The onset of the symptoms was  gradual starting 2 days ago.  The patient has associated SOB, weakness, fatigue, nausea, diarrhea, chills.  The symptoms have been  persistent, gradually worsened.  nothing makes the symptoms worse and nothing makes symptoms better.  The patient denies fever, headache, .  Daughter states pt was seen here on 02/25/12 for general illness and had a cholecystectomy on 02/26/12 by Lodema Pilot, MD.  Pt was discharged on 02/28/12 and daughter states the patient was alert, oriented, ambulatory without assistance and feeling better. She states the patient has had increasing weakness and confusion for the last 2 days with nausea and chills.   Past Medical History  Diagnosis Date  . Diverticulosis     Past Surgical History  Procedure Date  . Cholecystectomy 02/26/2012    Procedure: LAPAROSCOPIC CHOLECYSTECTOMY WITH INTRAOPERATIVE CHOLANGIOGRAM;  Surgeon: Lodema Pilot, DO;  Location: [facility];  Service: General;  Laterality: N/A;    No family history on file.  History  Substance Use Topics  . Smoking status: Never Smoker   . Smokeless tobacco: Not on file  . Alcohol Use: No    OB History    Grav Para Term Preterm Abortions TAB SAB Ect Mult Living                  Review of Systems  Constitutional: Positive for chills and fatigue. Negative for fever, diaphoresis, appetite change and unexpected weight change.  HENT: Negative for mouth sores, neck pain and neck stiffness.   Eyes: Negative for visual  disturbance.  Respiratory: Positive for shortness of breath. Negative for cough, chest tightness and wheezing.   Cardiovascular: Negative for chest pain.  Gastrointestinal: Positive for nausea, vomiting and diarrhea. Negative for abdominal pain and constipation.  Genitourinary: Negative for dysuria, urgency, frequency and hematuria.  Skin: Negative for rash.  Neurological: Positive for weakness and light-headedness. Negative for syncope and headaches.  Psychiatric/Behavioral: Positive for confusion. Negative for disturbed wake/sleep cycle. The patient is not nervous/anxious.   All other systems reviewed and are negative.    Allergies  Asa  Home Medications   Current Outpatient Rx  Name Route Sig Dispense Refill  . AMLODIPINE-OLMESARTAN 5-20 MG PO TABS Oral Take 1 tablet by mouth daily.    Marland Kitchen VITAMIN C PO Oral Take 1 tablet by mouth daily.    . CEPHALEXIN 500 MG PO CAPS Oral Take 500 mg by mouth 4 (four) times daily.    Marland Kitchen VITAMIN D PO Oral Take 1 tablet by mouth daily.    . OMEGA-3 FATTY ACIDS 1000 MG PO CAPS Oral Take 1 g by mouth daily.    Marland Kitchen GEMFIBROZIL 600 MG PO TABS Oral Take 600 mg by mouth 2 (two) times daily before a meal.    . HYDROCODONE-ACETAMINOPHEN 5-325 MG PO TABS Oral Take 1-2 tablets by mouth every 4 (four) hours as needed. Pain    . VITAMIN E PO Oral Take 1 capsule by mouth daily.      BP 149/83  Pulse 89  Temp  97.8 F (36.6 C) (Oral)  Resp 31  SpO2 98%  Physical Exam  Nursing note and vitals reviewed. Constitutional: She appears well-developed. No distress.  HENT:  Head: Normocephalic and atraumatic.  Mouth/Throat: Oropharynx is clear and moist. No oropharyngeal exudate.  Eyes: Conjunctivae normal and EOM are normal. Pupils are equal, round, and reactive to light. No scleral icterus.  Neck: Normal range of motion. Neck supple.  Cardiovascular: Regular rhythm, normal heart sounds and intact distal pulses.  Exam reveals no gallop and no friction rub.   No  murmur heard. Pulmonary/Chest: Breath sounds normal. Tachypnea noted. No respiratory distress. She has no wheezes.  Abdominal: Soft. Bowel sounds are normal. She exhibits no mass. There is no tenderness. There is no rebound and no guarding.  Musculoskeletal: Normal range of motion. She exhibits no edema.  Lymphadenopathy:    She has no cervical adenopathy.  Neurological: She is alert. No cranial nerve deficit.       Speech is clear and goal oriented Moves extremities without ataxia  Pt oriented to person and place, unable to orient to time (president or day).    Skin: Skin is warm and dry. No rash noted. She is not diaphoretic. No erythema.  Psychiatric: She has a normal mood and affect.    ED Course  Procedures (including critical care time)  Labs Reviewed  CBC WITH DIFFERENTIAL - Abnormal; Notable for the following:    Neutrophils Relative 84 (*)     Lymphocytes Relative 9 (*)     Lymphs Abs 0.5 (*)     All other components within normal limits  COMPREHENSIVE METABOLIC PANEL - Abnormal; Notable for the following:    CO2 14 (*)     Glucose, Bld 104 (*)     AST 119 (*)     ALT 140 (*)     Alkaline Phosphatase 203 (*)     All other components within normal limits  LIPASE, BLOOD - Abnormal; Notable for the following:    Lipase 235 (*)     All other components within normal limits  URINALYSIS, ROUTINE W REFLEX MICROSCOPIC - Abnormal; Notable for the following:    Bilirubin Urine SMALL (*)     Ketones, ur 15 (*)     Protein, ur 30 (*)     All other components within normal limits  URINE MICROSCOPIC-ADD ON - Abnormal; Notable for the following:    Bacteria, UA FEW (*)     All other components within normal limits  TROPONIN I  GLUCOSE, CAPILLARY  AMMONIA  D-DIMER, QUANTITATIVE  HEMOGLOBIN AND HEMATOCRIT, BLOOD   Ct Head Wo Contrast  03/01/2012  *RADIOLOGY REPORT*  Clinical Data: Fatigue.  CT HEAD WITHOUT CONTRAST  Technique:  Contiguous axial images were obtained from the  base of the skull through the vertex without contrast.  Comparison: No priors.  Findings: No acute intracranial abnormalities.  Specifically, no definite evidence of vascular territory acute/subacute cerebral ischemia, acute intracerebral hemorrhage, focal mass, mass effect, hydrocephalus or abnormal intra or extra-axial fluid collections. Faint physiologic calcifications are noted in the basal ganglia bilaterally (right greater than left).  Mild cerebral and cerebellar atrophy.  No acute displaced skull fractures are identified.  Visualized paranasal sinuses and mastoids are well pneumatized.  IMPRESSION: 1.  No acute intracranial abnormalities. 2.  Mild cerebral and cerebellar atrophy.   Original Report Authenticated By: Florencia Reasons, M.D.    US Abdomen Complete  03/01/2012  *RADIOLOGY REPORT*  Clinical Data:  Abdominal pain.  Recent cholecystectomy. Hypertension.  COMPLETE ABDOMINAL ULTRASOUND  Comparison:  Multiple exams, including 02/25/2012  Findings:  Gallbladder:  Surgically absent.  Common bile duct:  Measures 6 mm in diameter, within normal limits.  Liver:  Borderline intrahepatic biliary dilatation.  Otherwise negative.  IVC:  Appears normal.  Pancreas:  The pancreas divisum suggested on recent CT scan is not readily apparent sonographically.  Overall normal.  Spleen:  Measures 4.3 cm craniocaudad, within normal limits.  Right Kidney:  Measures 10.6 cm in length.Equivocally echogenic with respect to the liver, although some of this appearance may be due to the gain profile.  Left Kidney:  Measures 10.6 cm in length and contains a 0.8 cm hyperechoic focus in the lower pole potentially correlating with the small fat density on CT scan, likely a scar or tiny angiomyolipoma.  Abdominal aorta:  No aneurysm identified.  IMPRESSION:  1.  Borderline intrahepatic biliary dilatation, but without ascites along the liver margin. No CBD dilatation.  If there is a high clinical degree of suspicion of bile  leak, nuclear medicine hepatic biliary scan would be suggested. 2.  The pancreas divisum suggests on recent CT scan is not readily apparent sonographically. 3.  Tiny scar or angiomyolipoma in the left kidney lower pole. 4.  Equivocally echogenic right kidney.  Although this can be seen in chronic medical renal disease, serum creatinine is noted to be normal.   Original Report Authenticated By: Dellia Cloud, M.D.    Ct Abdomen Pelvis W Contrast  03/01/2012  *RADIOLOGY REPORT*  Clinical Data: Fatigue.  Status post cholecystectomy last Saturday. Evaluate for pancreatitis.  CT ABDOMEN AND PELVIS WITH CONTRAST  Technique:  Multidetector CT imaging of the abdomen and pelvis was performed following the standard protocol during bolus administration of intravenous contrast.  Contrast: 80mL OMNIPAQUE IOHEXOL 300 MG/ML  SOLN  Comparison: CT of the abdomen and pelvis 02/25/2012.  Findings:  Lung Bases: Unremarkable.  Abdomen/Pelvis:  Status post cholecystectomy.  Trace amount of fluid in the gallbladder fossa is within normal limits given the recent history of surgery.  No definite evidence of abscess or findings to suggest biloma.  There is mild diffuse periportal edema throughout the liver.  The pancreas is morphologically normal in appearance.  Specifically, the pancreatic parenchyma enhances normally, and there are no peripancreatic inflammatory changes to suggest an acute pancreatitis at this time.  The enhanced appearance of the spleen and bilateral adrenal glands is unremarkable.  Multiple subcentimeter low attenuation lesions are again noted in the kidneys bilaterally, too small to definitively characterize, but statistically favored to represent small cysts.  Trace volume of ascites in the pelvis there is not unexpected given the patient's recent history of surgery.  No larger volume of ascites is noted.  No pneumoperitoneum.  No pathologic distension of small bowel.  A few colonic diverticula are noted,  without definite surrounding inflammatory changes to suggest acute diverticulitis at this time.  Normal appendix.  Status post hysterectomy.  Ovaries appear atrophic.  Urinary bladder is unremarkable in appearance.  Musculoskeletal: There are no aggressive appearing lytic or blastic lesions noted in the visualized portions of the skeleton.  IMPRESSION: 1.  Postoperative changes of recent cholecystectomy, as above, without definite acute complicating features. 2.  Mild periportal edema in the liver is nonspecific.  Correlation with liver function tests is recommended. 3.  The pancreas is grossly normal in appearance.  Specifically, no imaging findings to suggest the presence of pancreatitis at this time. 4.  Trace volume of  ascites. 5.  Additional incidental findings, as above.   Original Report Authenticated By: Florencia Reasons, M.D.      ECG:  Date: 03/01/2012  Rate: 97  Rhythm: normal sinus rhythm  QRS Axis: normal  Intervals: normal  ST/T Wave abnormalities: nonspecific T wave changes  Conduction Disutrbances:none  Narrative Interpretation: T wave flattening and inversion V2-V6,   Old EKG Reviewed: changes noted   1. Altered mental status   2. Weakness   3. Fatigue       MDM  Everlean Patterson presents with weakness, confusion.  Pt with moderate PE risk based on the Well's Criteria.  CMP with elevated liver enzymes, and lipase.  Concern for choledocholithiasis vs pancreatitis.  CBC without leukocytosis or L shift, UA without evidence of UTI, Troponin neg and ECG non-ischemic.  [place] abd without evidence of dilated common bile duct.  Will obtain a CT scan.  CT scan of head negative CT scan of abdomen negative and without evidence of pancreatitis.  In the etiology of patient's altered mental status. Will consult the hospitalist service for admission and further workup.     Dahlia Client [place], PA-C 03/01/12 2054

## 2012-03-01 NOTE — H&P (Signed)
PCP:    None, recently moved from [place]  Chief Complaint:  Weakness and confusion  HPI: This is a 61 year old female who just had a cholecystectomy on Saturday. She was discharged on Monday, at that time she was doing well, gets around slowly and not confused. Yesterday the family noted the patient has been getting more confused, she keeps repeating the same questions over and over and was mentating slower. Today she had diarrhea all day. She had an emesis once yesterday. She doesn't complain of any significant abdominal pain, no fevers, her appetite is poor. She does complain of chills. She has not taken her medication since discharge. She is also been short of breath and weaker. Today she needed assistance to get to the bathroom but she was too weak to get off the toilet. Per family she was in there over an hour.   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 She has an appointment at [facility] with a ophthalmologist for biopsy. Postoperatively per family her left eyes appears to be bulging more, she is increased blurred vision and is more dizzy. Her headache is no worse than baseline.  During my interview patient is alert and oriented but confused. She does not know who the president is, she can't remember what she had for breakfast. She does not remember her diagnosis is her medication. The family states this is unusual for her. History provided by the patient and family were at the bedside.  Review of Systems:  The patient denies anorexia, fever, weight loss,, vision loss, decreased hearing, hoarseness, chest pain, syncope, dyspnea on exertion, peripheral edema, balance deficits, hemoptysis, abdominal pain, melena, hematochezia, severe indigestion/heartburn, hematuria, incontinence, genital sores, muscle weakness, suspicious skin lesions, transient blindness, depression, unusual weight change, abnormal bleeding, enlarged lymph nodes, angioedema, and breast  masses.  Past Medical History: Past Medical History  Diagnosis Date  . Diverticulosis   . Hypertension   . 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 H/O gastric ulcer    Past Surgical History  Procedure Date  . Cholecystectomy 02/26/2012    Procedure: LAPAROSCOPIC CHOLECYSTECTOMY WITH INTRAOPERATIVE CHOLANGIOGRAM;  Surgeon: Lodema Pilot, DO;  Location: [facility];  Service: General;  Laterality: N/A;  . Abdominal hysterectomy   . Orthoscopic knee surgery     Medications: Prior to Admission medications   Medication Sig Start Date End Date Taking? Authorizing Provider  amLODipine-olmesartan (AZOR) 5-20 MG per tablet Take 1 tablet by mouth daily.   Yes Historical Provider, MD  Ascorbic Acid (VITAMIN C PO) Take 1 tablet by mouth daily.   Yes Historical Provider, MD  cephALEXin (KEFLEX) 500 MG capsule Take 500 mg by mouth 4 (four) times daily. 01/30/12  Yes Tatyana A Kirichenko, PA  Cholecalciferol (VITAMIN D PO) Take 1 tablet by mouth daily.   Yes Historical Provider, MD  fish oil-omega-3 fatty acids 1000 MG capsule Take 1 g by mouth daily.   Yes Historical Provider, MD  gemfibrozil (LOPID) 600 MG tablet Take 600 mg by mouth 2 (two) times daily before a meal.   Yes Historical Provider, MD  HYDROcodone-acetaminophen (NORCO/VICODIN) 5-325 MG per tablet Take 1-2 tablets by mouth every 4 (four) hours as needed. Pain 02/28/12  Yes Letha Cape, PA  VITAMIN E PO Take 1 capsule by mouth daily.   Yes Historical Provider, MD    Allergies:   Allergies  Allergen Reactions  . Jonne Ply (  Aspirin) Other (See Comments)    Causes ulcer    Social History:  reports that she has never smoked. She has never used smokeless tobacco. She reports that she does not drink alcohol or use illicit drugs.  Family History: Family History  Problem Relation Age of Onset  . Ovarian cancer    . Prostate cancer    . Hypertension      Physical Exam: Filed Vitals:   03/01/12 1815 03/01/12  1830 03/01/12 1900 03/01/12 1915  BP:      Pulse:  84  89  Temp:      TempSrc:      Resp: 26 24 26 31   SpO2:  99%  98%    General:  Alert and oriented times three, well developed and nourished, no acute distress Eyes: PERRLA, pink conjunctiva, no scleral icterus, left eye bulging ENT: Dry oral mucosa, neck supple, no thyromegaly Lungs: clear to ascultation, no wheeze, no crackles, no use of accessory muscles Cardiovascular: regular rate and rhythm, no regurgitation, no gallops, no murmurs. No carotid bruits, no JVD Abdomen: soft, positive BS, non-tender, non-distended, no organomegaly, not an acute abdomen, postsurgical incision without evidence of infection GU: not examined Neuro: CN II - XII grossly intact, sensation intact Musculoskeletal: strength 5/5 all extremities, no clubbing, cyanosis or edema Skin: no rash, no subcutaneous crepitation, no decubitus Psych: Confused   Labs on Admission:   [facility] 03/01/12 1239 02/28/12 0435  NA 140 134*  K 3.6 3.9  CL 106 109  CO2 14* 13*  GLUCOSE 104* 128*  BUN 7 4*  CREATININE 0.72 0.94  CALCIUM 9.5 8.4  MG -- --  PHOS -- --    Basename 03/01/12 1239 02/28/12 0435  AST 119* 79*  ALT 140* 125*  ALKPHOS 203* 142*  BILITOT 0.8 1.1  PROT 7.2 5.2*  ALBUMIN 3.5 2.6*    Basename 03/01/12 1239  LIPASE 235*  AMYLASE --    Basename 03/01/12 1239 02/28/12 0435  WBC 5.7 5.0  NEUTROABS 4.8 2.9  HGB 14.8 8.8*  HCT 44.2 26.3*  MCV 88.9 91.3  PLT 270 263    Basename 03/01/12 1239  CKTOTAL --  CKMB --  CKMBINDEX --  TROPONINI <0.30   No components found with this basename: POCBNP:3  Basename 03/01/12 2105  DDIMER 0.68*  Results for KAMYA, REINEKE (MRN [id number]) as of 03/01/2012 21:40  Ref. Range 03/01/2012 12:39 03/01/2012 21:05  Ammonia Latest Range: 11-60 umol/L  27   No results found for this basename: HGBA1C:2 in the last 72 hours No results found for this basename:  CHOL:2,HDL:2,LDLCALC:2,TRIG:2,CHOLHDL:2,LDLDIRECT:2 in the last 72 hours No results found for this basename: TSH,T4TOTAL,FREET3,T3FREE,THYROIDAB in the last 72 hours No results found for this basename: VITAMINB12:2,FOLATE:2,FERRITIN:2,TIBC:2,IRON:2,RETICCTPCT:2 in the last 72 hours  Micro Results: Recent Results (from the past 240 hour(s))  MRSA PCR SCREENING     Status: Normal   Collection Time   02/26/12 11:35 AM      Component Value Range Status Comment   MRSA by PCR NEGATIVE  NEGATIVE Final      Radiological Exams on Admission: Dg Chest 1 View  03/01/2012  *RADIOLOGY REPORT*  Clinical Data: Shortness of breath.  CHEST - 1 VIEW  Comparison: No priors.  Findings: Linear opacity at the left bases compatible with subsegmental atelectasis.  No consolidative airspace disease.  No pleural effusions.  Pulmonary vasculature and the cardiomediastinal silhouette are within normal limits.  No pneumothorax.  No suspicious appearing pulmonary nodule or mass.  IMPRESSION:  1.  Minimal subsegmental atelectasis at the left base.  Otherwise, no radiographic evidence of acute cardiopulmonary disease.   Original Report Authenticated By: Florencia Reasons, M.D.    Ct Head Wo Contrast  03/01/2012  *RADIOLOGY REPORT*  Clinical Data: Fatigue.  CT HEAD WITHOUT CONTRAST  Technique:  Contiguous axial images were obtained from the base of the skull through the vertex without contrast.  Comparison: No priors.  Findings: No acute intracranial abnormalities.  Specifically, no definite evidence of vascular territory acute/subacute cerebral ischemia, acute intracerebral hemorrhage, focal mass, mass effect, hydrocephalus or abnormal intra or extra-axial fluid collections. Faint physiologic calcifications are noted in the basal ganglia bilaterally (right greater than left).  Mild cerebral and cerebellar atrophy.  No acute displaced skull fractures are identified.  Visualized paranasal sinuses and mastoids are well  pneumatized.  IMPRESSION: 1.  No acute intracranial abnormalities. 2.  Mild cerebral and cerebellar atrophy.   Original Report Authenticated By: Florencia Reasons, M.D.    US Abdomen Complete  03/01/2012  *RADIOLOGY REPORT*  Clinical Data:  Abdominal pain.  Recent cholecystectomy. Hypertension.  COMPLETE ABDOMINAL ULTRASOUND  Comparison:  Multiple exams, including 02/25/2012  Findings:  Gallbladder:  Surgically absent.  Common bile duct:  Measures 6 mm in diameter, within normal limits.  Liver:  Borderline intrahepatic biliary dilatation.  Otherwise negative.  IVC:  Appears normal.  Pancreas:  The pancreas divisum suggested on recent CT scan is not readily apparent sonographically.  Overall normal.  Spleen:  Measures 4.3 cm craniocaudad, within normal limits.  Right Kidney:  Measures 10.6 cm in length.Equivocally echogenic with respect to the liver, although some of this appearance may be due to the gain profile.  Left Kidney:  Measures 10.6 cm in length and contains a 0.8 cm hyperechoic focus in the lower pole potentially correlating with the small fat density on CT scan, likely a scar or tiny angiomyolipoma.  Abdominal aorta:  No aneurysm identified.  IMPRESSION:  1.  Borderline intrahepatic biliary dilatation, but without ascites along the liver margin. No CBD dilatation.  If there is a high clinical degree of suspicion of bile leak, nuclear medicine hepatic biliary scan would be suggested. 2.  The pancreas divisum suggests on recent CT scan is not readily apparent sonographically. 3.  Tiny scar or angiomyolipoma in the left kidney lower pole. 4.  Equivocally echogenic right kidney.  Although this can be seen in chronic medical renal disease, serum creatinine is noted to be normal.   Original Report Authenticated By: Dellia Cloud, M.D.    Ct Abdomen Pelvis W Contrast  03/01/2012  *RADIOLOGY REPORT*  Clinical Data: Fatigue.  Status post cholecystectomy last Saturday. Evaluate for pancreatitis.  CT  ABDOMEN AND PELVIS WITH CONTRAST  Technique:  Multidetector CT imaging of the abdomen and pelvis was performed following the standard protocol during bolus administration of intravenous contrast.  Contrast: 80mL OMNIPAQUE IOHEXOL 300 MG/ML  SOLN  Comparison: CT of the abdomen and pelvis 02/25/2012.  Findings:  Lung Bases: Unremarkable.  Abdomen/Pelvis:  Status post cholecystectomy.  Trace amount of fluid in the gallbladder fossa is within normal limits given the recent history of surgery.  No definite evidence of abscess or findings to suggest biloma.  There is mild diffuse periportal edema throughout the liver.  The pancreas is morphologically normal in appearance.  Specifically, the pancreatic parenchyma enhances normally, and there are no peripancreatic inflammatory changes to suggest an acute pancreatitis at this time.  The enhanced appearance of the spleen and bilateral  adrenal glands is unremarkable.  Multiple subcentimeter low attenuation lesions are again noted in the kidneys bilaterally, too small to definitively characterize, but statistically favored to represent small cysts.  Trace volume of ascites in the pelvis there is not unexpected given the patient's recent history of surgery.  No larger volume of ascites is noted.  No pneumoperitoneum.  No pathologic distension of small bowel.  A few colonic diverticula are noted, without definite surrounding inflammatory changes to suggest acute diverticulitis at this time.  Normal appendix.  Status post hysterectomy.  Ovaries appear atrophic.  Urinary bladder is unremarkable in appearance.  Musculoskeletal: There are no aggressive appearing lytic or blastic lesions noted in the visualized portions of the skeleton.  IMPRESSION: 1.  Postoperative changes of recent cholecystectomy, as above, without definite acute complicating features. 2.  Mild periportal edema in the liver is nonspecific.  Correlation with liver function tests is recommended. 3.  The pancreas is  grossly normal in appearance.  Specifically, no imaging findings to suggest the presence of pancreatitis at this time. 4.  Trace volume of ascites. 5.  Additional incidental findings, as above.   Original Report Authenticated By: Florencia Reasons, M.D.     Assessment/Plan Present on Admission:  .Encephalopathy acute Weakness Bring in 23 hr observation Unclear etiology for encephalopathy. No evidence of infection, ammonia level normal Check a TSH, RPR, vitamin B 12 level Status post cholecystectomy Transaminitis Patient gemfibrozil will be held, repeat CMP in a.m.Marland Kitchen Elevated LFTs likely from surgical manipulation.  Defer to a.m. team surgical consult in a.m Dr Arlys John Biagio Quint. Deconditioning Repeat H&H ordered. Patient's hemoglobin postoperatively was approximately 9. No transfusions were given postoperatively, however, hemoglobin two days later is 14.8. Repeat ordered for accuracy Diarrhea Check C. difficile toxin Tumor behind left orbit Will order MRI of orbits the a.m. unclear tumor could be causing edema that could be a leading to patient's altered mentation Hyperlipidemia Hypertension Diverticulitis  Stable home medications resumed    Full code SCDs for DVT prophylaxis   Carlise Stofer 03/01/2012, 9:45 PM

## 2012-03-01 NOTE — ED Notes (Signed)
Dr. Crosley at bedside 

## 2012-03-01 NOTE — ED Notes (Signed)
Floor Unit RN unavailable for report at this time.

## 2012-03-01 NOTE — ED Notes (Signed)
[id number]> Expected date:<BR> Expected time:<BR> Means of arrival:<BR> Comments:<BR> weakness

## 2012-03-01 NOTE — ED Notes (Signed)
Per EMS-c/o of generalized weakness that started after she had her gallbladder removed on Saturday. Pt states that she was just discharged from this facility on Monday. States that she has had a couple of episodes of loose stools that started yesterday. Pt is mobile but c/o of not being able to walk due to weakness. CGB 125

## 2012-03-02 ENCOUNTER — Inpatient Hospital Stay (HOSPITAL_COMMUNITY): Payer: BC Managed Care – HMO

## 2012-03-02 DIAGNOSIS — I10 Essential (primary) hypertension: Secondary | ICD-10-CM

## 2012-03-02 DIAGNOSIS — R5381 Other malaise: Secondary | ICD-10-CM

## 2012-03-02 LAB — TSH
TSH: <0.008 u[IU]/mL — ABNORMAL LOW (ref 0.350–4.500)
TSH: <0.008 u[IU]/mL — ABNORMAL LOW (ref 0.350–4.500)

## 2012-03-02 LAB — CBC
MCH: 30.1 pg (ref 26.0–34.0)
MCV: 88 fL (ref 78.0–100.0)
Platelets: 365 10*3/uL (ref 150–400)
RDW: 12.4 % (ref 11.5–15.5)

## 2012-03-02 LAB — COMPREHENSIVE METABOLIC PANEL
AST: 63 U/L — ABNORMAL HIGH (ref 0–37)
Albumin: 2.7 g/dL — ABNORMAL LOW (ref 3.5–5.2)
Alkaline Phosphatase: 163 U/L — ABNORMAL HIGH (ref 39–117)
Chloride: 109 mEq/L (ref 96–112)
Potassium: 3 mEq/L — ABNORMAL LOW (ref 3.5–5.1)
Sodium: 139 mEq/L (ref 135–145)
Total Bilirubin: 0.5 mg/dL (ref 0.3–1.2)

## 2012-03-02 LAB — T4, FREE: Free T4: 1.14 ng/dL (ref 0.80–1.80)

## 2012-03-02 MED ORDER — INFLUENZA VIRUS VACC SPLIT PF IM SUSP
0.5000 mL | INTRAMUSCULAR | Status: AC
  Filled 2012-03-02: qty 0.5

## 2012-03-02 MED ORDER — SACCHAROMYCES BOULARDII 250 MG PO CAPS
250.0000 mg | ORAL_CAPSULE | Freq: Two times a day (BID) | ORAL | Status: DC
  Administered 2012-03-02 – 2012-03-03 (×2): 250 mg via ORAL
  Filled 2012-03-02 (×3): qty 1

## 2012-03-02 MED ORDER — BOOST / RESOURCE BREEZE PO LIQD
1.0000 | Freq: Three times a day (TID) | ORAL | Status: DC
Start: 2012-03-02 — End: 2012-03-14
  Administered 2012-03-02 – 2012-03-14 (×26): 1 via ORAL

## 2012-03-02 MED ORDER — OXYCODONE HCL 5 MG PO TABS
5.0000 mg | ORAL_TABLET | Freq: Four times a day (QID) | ORAL | Status: DC | PRN
  Administered 2012-03-03: 5 mg via ORAL
  Filled 2012-03-02: qty 1

## 2012-03-02 MED ORDER — POTASSIUM CHLORIDE CRYS ER 20 MEQ PO TBCR
40.0000 meq | EXTENDED_RELEASE_TABLET | ORAL | Status: AC
  Administered 2012-03-02: 40 meq via ORAL
  Filled 2012-03-02: qty 2

## 2012-03-02 MED ORDER — GADOBENATE DIMEGLUMINE 529 MG/ML IV SOLN
10.0000 mL | Freq: Once | INTRAVENOUS | Status: AC | PRN
  Administered 2012-03-02: 10 mL via INTRAVENOUS

## 2012-03-02 NOTE — Progress Notes (Signed)
INITIAL ADULT NUTRITION ASSESSMENT Date: 03/02/2012   Time: 1:18 PM Reason for Assessment: Nutrition Risk   ASSESSMENT: Female 61 y.o.  Dx: weakness and confusion   Hx:  Past Medical History  Diagnosis Date  . Diverticulosis   . Hypertension   . 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 H/O gastric ulcer     Related Meds:  Scheduled Meds:   . amLODipine  5 mg Oral Daily   And  . irbesartan  150 mg Oral Daily  . influenza  inactive virus vaccine  0.5 mL Intramuscular Tomorrow-1000  . sodium chloride  1,000 mL Intravenous Once  . sodium chloride  3 mL Intravenous Q12H  . DISCONTD: amLODipine-olmesartan  1 tablet Oral Daily   Continuous Infusions:   . 0.9 % NaCl with KCl 20 mEq / L 75 mL/hr (03/02/12 1312)   PRN Meds:.acetaminophen, acetaminophen, gadobenate dimeglumine, iohexol, ondansetron (ZOFRAN) IV, ondansetron, oxyCODONE   Ht: 5' 2.5" (158.8 cm)  Wt: 117 lb 11.6 oz (53.4 kg) (standing scale)  Ideal Wt: 50 kg % Ideal Wt: 106% Wt Readings from Last 10 Encounters:  03/01/12 117 lb 11.6 oz (53.4 kg)  02/26/12 120 lb 13 oz (54.8 kg)  02/26/12 120 lb 13 oz (54.8 kg)    Usual Wt: 145 lb  % Usual Wt: 80%  BMI: 21.39 kg/m^2 (WNL)  Food/Nutrition Related Hx: Patient confused at time of RD. Pt not consuming clear liquids. Spoke with patient's family member. She reported patient was eating well PTA but has lost a lot of weight. She reported her appetite is ok. She reported patient likes Ensure nutrition supplement.   Labs:  CMP     Component Value Date/Time   NA 139 03/02/2012 0446   K 3.0* 03/02/2012 0446   CL 109 03/02/2012 0446   CO2 16* 03/02/2012 0446   GLUCOSE 108* 03/02/2012 0446   BUN 8 03/02/2012 0446   CREATININE 0.67 03/02/2012 0446   CALCIUM 8.2* 03/02/2012 0446   PROT 5.6* 03/02/2012 0446   ALBUMIN 2.7* 03/02/2012 0446   AST 63* 03/02/2012 0446   ALT 92* 03/02/2012 0446   ALKPHOS 163* 03/02/2012 0446   BILITOT 0.5  03/02/2012 0446   GFRNONAA >90 03/02/2012 0446   GFRAA >90 03/02/2012 0446     Intake/Output Summary (Last 24 hours) at 03/02/12 1324 Last data filed at 03/02/12 1100  Gross per 24 hour  Intake 1063.25 ml  Output    451 ml  Net 612.25 ml     Diet Order: Clear Liquid  Supplements/Tube Feeding: none at this time   IVF:    0.9 % NaCl with KCl 20 mEq / L Last Rate: 75 mL/hr (03/02/12 1312)    Estimated Nutritional Needs:   Kcal: 1400-1500 Protein: 63-80 grams Fluid: 1 ml per kcal intake   NUTRITION DIAGNOSIS: -Inadequate oral intake (NI-2.1).  Status: Ongoing  RELATED TO: weakness and confusion   AS EVIDENCE BY: pt with 0% PO intake at meals on clear liquid diet.   MONITORING/EVALUATION(Goals): Diet advancements/ tolerance, weights, labs, PO intake  1. Diet advancements as medically able. 2. Promote weight maintenance.  3. PO intake > 75% at meals and supplements.   EDUCATION NEEDS: -No education needs identified at this time  INTERVENTION: 1. Will order patient Resource Breeze TID, provides 750 kcal and 27 grams of protein daily. Recommend switch to Ensure once diet advanced.  2. RD to follow for nutrition plan of care.  Dietitian [phone]  DOCUMENTATION CODES Per approved criteria  -Not Applicable    Iven Finn [facility] 03/02/2012, 1:18 PM

## 2012-03-02 NOTE — Progress Notes (Signed)
[facility] PROGRESS NOTE  Abigail Powell NWG:[id number] DOB: [birth year] DOA: 03/01/2012 PCP: Sheila Oats, MD  Assessment/Plan: 1-Metabolic encephalopathy: at this point without clear etiology. Suspect is related to abnormal thyroid function. Will repeat TSH and free T4 and depending results start appropriate treatment. Also dehydration and electrolytes imbalance playing a role; will continue IVF's resuscitation and follow electrolytes.  2-Transaminitis:secondary to recent surgical manipulation most likely; no signs of infection or other abnormalities on CT and [place]. Will hold on calling surgery at this point.  3-physical deconditioning: multifactorial and most likely associated with decrease PO intake and recent surgery. Patient encourage to increase PO intake and will have PT evaluating patient.  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  5-HTN: stable. Continue current regimen.  6-Diarrhea: no further diarrhea; will follow on C. Diff test order. Continue fluid resuscitation and also electrolytes repletion.  7-hypokalemia: secondary to diarrhea; will replete.  8-DVT: SCD's   Code Status: Full Family Communication: daughter and son at bedside Disposition Plan: will ask PT to examine and help with discharge decisions.    Consultants:  none  Procedures:  MRI orbit (positive mass with spreading behavior into optic nerve)  Antibiotics:  None  HPI/Subjective: Afebrile, no CP, no SOB; patient slightly less confused and w/o any further episodes of diarrhea.  Objective: Filed Vitals:   03/01/12 1915 03/01/12 2333 03/02/12 0507 03/02/12 1300  BP:  149/85 130/75 135/72  Pulse: 89 97 82 85  Temp:  97.9 F (36.6 C) 98.6 F (37 C) 98 F (36.7 C)  TempSrc:  Oral Oral   Resp: 31 20 22 26   Height:  5' 2.5" (1.588 m)    Weight:  53.4 kg (117 lb 11.6 oz)    SpO2: 98% 100% 99%  100%    Intake/Output Summary (Last 24 hours) at 03/02/12 1827 Last data filed at 03/02/12 1100  Gross per 24 hour  Intake 1063.25 ml  Output    451 ml  Net 612.25 ml   Filed Weights   03/01/12 2333  Weight: 53.4 kg (117 lb 11.6 oz)    Exam:   General:  NAD, afebrile; AAOX2; still confused  Cardiovascular: mild tachycardia, no rubs or gallops; no murmur  Respiratory: CTA bilaterally  Abdomen: soft, NT, ND, positive BS  Extremities: no edema, no erythema, no cyanosis  Neuro: AAOX2; CN intact (except for Left eyes with exophthalmus and abnormal EO movements; no focal deficit appreciated  Data Reviewed: Basic Metabolic Panel:  Lab 03/02/12 5784 03/01/12 1239 02/28/12 0435 02/27/12 0537 02/26/12 0530  NA 139 140 134* 134* 137  K 3.0* 3.6 3.9 3.6 3.8  CL 109 106 109 107 110  CO2 16* 14* 13* 14* 15*  GLUCOSE 108* 104* 128* 140* 120*  BUN 8 7 4* 7 17  CREATININE 0.67 0.72 0.94 1.05 1.04  CALCIUM 8.2* 9.5 8.4 8.7 8.8  MG -- -- -- -- --  PHOS -- -- -- -- --   Liver Function Tests:  Lab 03/02/12 0446 03/01/12 1239 02/28/12 0435 02/27/12 0537 02/26/12 0530  AST 63* 119* 79* 117* 137*  ALT 92* 140* 125* 150* 157*  ALKPHOS 163* 203* 142* 147* 115  BILITOT 0.5 0.8 1.1 0.9 0.6  PROT 5.6* 7.2 5.2* 5.5* 5.4*  ALBUMIN 2.7* 3.5 2.6* 2.7* 2.8*    Lab 03/01/12 1239 02/25/12 1641  LIPASE 235* 27  AMYLASE -- --    Lab  03/02/12 0446 03/01/12 2105  AMMONIA 47 27   CBC:  Lab 03/02/12 0446 03/01/12 2105 03/01/12 1239 02/28/12 0435 02/27/12 0537 02/26/12 1423 02/25/12 1351  WBC 8.5 -- 5.7 5.0 7.4 6.2 --  NEUTROABS -- -- 4.8 2.9 -- -- 8.5*  HGB 10.0* 11.8* 14.8 8.8* 9.4* -- --  HCT 29.2* 34.4* 44.2 26.3* 28.2* -- --  MCV 88.0 -- 88.9 91.3 91.0 91.1 --  PLT 365 -- 270 263 310 305 --   Cardiac Enzymes:  Lab 03/01/12 1239  CKTOTAL --  CKMB --  CKMBINDEX --  TROPONINI <0.30   CBG:  Lab 03/01/12 1910  GLUCAP 72    Recent Results (from the past 240 hour(s))  MRSA  PCR SCREENING     Status: Normal   Collection Time   02/26/12 11:35 AM      Component Value Range Status Comment   MRSA by PCR NEGATIVE  NEGATIVE Final      Studies: Dg Chest 1 View  03/01/2012  *RADIOLOGY REPORT*  Clinical Data: Shortness of breath.  CHEST - 1 VIEW  Comparison: No priors.  Findings: Linear opacity at the left bases compatible with subsegmental atelectasis.  No consolidative airspace disease.  No pleural effusions.  Pulmonary vasculature and the cardiomediastinal silhouette are within normal limits.  No pneumothorax.  No suspicious appearing pulmonary nodule or mass.  IMPRESSION: 1.  Minimal subsegmental atelectasis at the left base.  Otherwise, no radiographic evidence of acute cardiopulmonary disease.   Original Report Authenticated By: Florencia Reasons, M.D.    Ct Head Wo Contrast  03/01/2012  *RADIOLOGY REPORT*  Clinical Data: Fatigue.  CT HEAD WITHOUT CONTRAST  Technique:  Contiguous axial images were obtained from the base of the skull through the vertex without contrast.  Comparison: No priors.  Findings: No acute intracranial abnormalities.  Specifically, no definite evidence of vascular territory acute/subacute cerebral ischemia, acute intracerebral hemorrhage, focal mass, mass effect, hydrocephalus or abnormal intra or extra-axial fluid collections. Faint physiologic calcifications are noted in the basal ganglia bilaterally (right greater than left).  Mild cerebral and cerebellar atrophy.  No acute displaced skull fractures are identified.  Visualized paranasal sinuses and mastoids are well pneumatized.  IMPRESSION: 1.  No acute intracranial abnormalities. 2.  Mild cerebral and cerebellar atrophy.   Original Report Authenticated By: Florencia Reasons, M.D.    US Abdomen Complete  03/01/2012  *RADIOLOGY REPORT*  Clinical Data:  Abdominal pain.  Recent cholecystectomy. Hypertension.  COMPLETE ABDOMINAL ULTRASOUND  Comparison:  Multiple exams, including 02/25/2012   Findings:  Gallbladder:  Surgically absent.  Common bile duct:  Measures 6 mm in diameter, within normal limits.  Liver:  Borderline intrahepatic biliary dilatation.  Otherwise negative.  IVC:  Appears normal.  Pancreas:  The pancreas divisum suggested on recent CT scan is not readily apparent sonographically.  Overall normal.  Spleen:  Measures 4.3 cm craniocaudad, within normal limits.  Right Kidney:  Measures 10.6 cm in length.Equivocally echogenic with respect to the liver, although some of this appearance may be due to the gain profile.  Left Kidney:  Measures 10.6 cm in length and contains a 0.8 cm hyperechoic focus in the lower pole potentially correlating with the small fat density on CT scan, likely a scar or tiny angiomyolipoma.  Abdominal aorta:  No aneurysm identified.  IMPRESSION:  1.  Borderline intrahepatic biliary dilatation, but without ascites along the liver margin. No CBD dilatation.  If there is a high clinical degree of suspicion of bile leak,  nuclear medicine hepatic biliary scan would be suggested. 2.  The pancreas divisum suggests on recent CT scan is not readily apparent sonographically. 3.  Tiny scar or angiomyolipoma in the left kidney lower pole. 4.  Equivocally echogenic right kidney.  Although this can be seen in chronic medical renal disease, serum creatinine is noted to be normal.   Original Report Authenticated By: Dellia Cloud, M.D.    Ct Abdomen Pelvis W Contrast  03/01/2012  *RADIOLOGY REPORT*  Clinical Data: Fatigue.  Status post cholecystectomy last Saturday. Evaluate for pancreatitis.  CT ABDOMEN AND PELVIS WITH CONTRAST  Technique:  Multidetector CT imaging of the abdomen and pelvis was performed following the standard protocol during bolus administration of intravenous contrast.  Contrast: 80mL OMNIPAQUE IOHEXOL 300 MG/ML  SOLN  Comparison: CT of the abdomen and pelvis 02/25/2012.  Findings:  Lung Bases: Unremarkable.  Abdomen/Pelvis:  Status post  cholecystectomy.  Trace amount of fluid in the gallbladder fossa is within normal limits given the recent history of surgery.  No definite evidence of abscess or findings to suggest biloma.  There is mild diffuse periportal edema throughout the liver.  The pancreas is morphologically normal in appearance.  Specifically, the pancreatic parenchyma enhances normally, and there are no peripancreatic inflammatory changes to suggest an acute pancreatitis at this time.  The enhanced appearance of the spleen and bilateral adrenal glands is unremarkable.  Multiple subcentimeter low attenuation lesions are again noted in the kidneys bilaterally, too small to definitively characterize, but statistically favored to represent small cysts.  Trace volume of ascites in the pelvis there is not unexpected given the patient's recent history of surgery.  No larger volume of ascites is noted.  No pneumoperitoneum.  No pathologic distension of small bowel.  A few colonic diverticula are noted, without definite surrounding inflammatory changes to suggest acute diverticulitis at this time.  Normal appendix.  Status post hysterectomy.  Ovaries appear atrophic.  Urinary bladder is unremarkable in appearance.  Musculoskeletal: There are no aggressive appearing lytic or blastic lesions noted in the visualized portions of the skeleton.  IMPRESSION: 1.  Postoperative changes of recent cholecystectomy, as above, without definite acute complicating features. 2.  Mild periportal edema in the liver is nonspecific.  Correlation with liver function tests is recommended. 3.  The pancreas is grossly normal in appearance.  Specifically, no imaging findings to suggest the presence of pancreatitis at this time. 4.  Trace volume of ascites. 5.  Additional incidental findings, as above.   Original Report Authenticated By: Florencia Reasons, M.D.    Mr Orbits Wo/w Cm  03/02/2012  *RADIOLOGY REPORT*  Clinical Data: Evaluate left orbital tumor.  MRI ORBITS  WITHOUT AND WITH CONTRAST  Technique:  Multiplanar and multiecho pulse sequences of the orbits and surrounding structures were obtained including fat saturation techniques, before and after intravenous contrast administration.  Contrast: 10mL MULTIHANCE GADOBENATE DIMEGLUMINE 529 MG/ML IV SOLN  Comparison: CT head 03/01/2012  Findings: Enhancing mass in the left posterior orbit in the orbital apex.  This is an intraconal mass and is compressing the optic nerve.  The mass shows intense and homogeneous enhancement.  The main component of mass measures 18 x 11 mm. The margins of the mass are ill-defined.  The mass extends through the optic canal and into the anterior aspect of the cavernous sinus.  The mass also spread along the dural surface of the middle cranial fossa on the left. There is edema in the orbital fat on the left  likely due to venous congestion.  The extraocular muscles are not enlarged.  There is exophthalmos on the left.  The right orbit is normal.  Paranasal sinuses are clear. Visualized brain on this limited study of the brain is negative.  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  The mass extends through the optic canal into the cavernous sinus with spread along the anterior dural surface of the middle cranial fossa.  Differential considerations include metastatic disease with perineural spread.  Lymphoma and meningioma are  other possibilities.   Original Report Authenticated By: Camelia Phenes, M.D.     Scheduled Meds:   . amLODipine  5 mg Oral Daily   And  . irbesartan  150 mg Oral Daily  . feeding supplement  1 Container Oral TID BM  . influenza  inactive virus vaccine  0.5 mL Intramuscular Tomorrow-1000  . sodium chloride  1,000 mL Intravenous Once  . sodium chloride  3 mL Intravenous Q12H  . DISCONTD: amLODipine-olmesartan  1 tablet Oral Daily   Continuous Infusions:   . 0.9 % NaCl with KCl 20 mEq / L  75 mL/hr (03/02/12 1312)    Time spent: >30 minutes    Celesta Funderburk  [facility] Pager [phone]. If 8PM-8AM, please contact night-coverage at [email or web address], password [facility] 03/02/2012, 6:27 PM  LOS: 1 day

## 2012-03-02 NOTE — ED Provider Notes (Signed)
Medical screening examination/treatment/procedure(s) were conducted as a shared visit with non-physician practitioner(s) and myself.  I personally evaluated the patient during the encounter   Marinda Tyer, MD 03/02/12 0732 

## 2012-03-03 DIAGNOSIS — R7401 Elevation of levels of liver transaminase levels: Secondary | ICD-10-CM | POA: Diagnosis present

## 2012-03-03 DIAGNOSIS — E0590 Thyrotoxicosis, unspecified without thyrotoxic crisis or storm: Secondary | ICD-10-CM | POA: Diagnosis present

## 2012-03-03 DIAGNOSIS — E876 Hypokalemia: Secondary | ICD-10-CM | POA: Diagnosis present

## 2012-03-03 LAB — BASIC METABOLIC PANEL
BUN: 4 mg/dL — ABNORMAL LOW (ref 6–23)
Creatinine, Ser: 0.65 mg/dL (ref 0.50–1.10)
GFR calc Af Amer: >90 mL/min (ref >90)
GFR calc non Af Amer: >90 mL/min (ref >90)

## 2012-03-03 LAB — URINALYSIS, ROUTINE W REFLEX MICROSCOPIC
Bilirubin Urine: NEGATIVE
Hgb urine dipstick: NEGATIVE
Ketones, ur: NEGATIVE mg/dL
Protein, ur: NEGATIVE mg/dL
Urobilinogen, UA: 0.2 mg/dL (ref 0.0–1.0)

## 2012-03-03 MED ORDER — SODIUM BICARBONATE 650 MG PO TABS
650.0000 mg | ORAL_TABLET | Freq: Three times a day (TID) | ORAL | Status: AC
  Administered 2012-03-03 – 2012-03-04 (×2): 650 mg via ORAL
  Filled 2012-03-03 (×6): qty 1

## 2012-03-03 MED ORDER — PROMETHAZINE HCL 25 MG/ML IJ SOLN
12.5000 mg | Freq: Once | INTRAMUSCULAR | Status: AC
  Administered 2012-03-03: 12.5 mg via INTRAVENOUS
  Filled 2012-03-03: qty 1

## 2012-03-03 MED ORDER — PROCHLORPERAZINE EDISYLATE 5 MG/ML IJ SOLN
10.0000 mg | Freq: Four times a day (QID) | INTRAMUSCULAR | Status: DC | PRN
  Administered 2012-03-03 – 2012-03-14 (×4): 10 mg via INTRAVENOUS
  Filled 2012-03-03 (×4): qty 2

## 2012-03-03 MED ORDER — KCL IN DEXTROSE-NACL 20-5-0.2 MEQ/L-%-% IV SOLN
INTRAVENOUS | Status: DC
  Administered 2012-03-03: 75 mL/h via INTRAVENOUS
  Administered 2012-03-04 – 2012-03-05 (×2): via INTRAVENOUS
  Administered 2012-03-05 – 2012-03-07 (×3): 100 mL/h via INTRAVENOUS
  Administered 2012-03-07: 02:00:00 via INTRAVENOUS
  Administered 2012-03-08: 50 mL via INTRAVENOUS
  Administered 2012-03-09: 09:00:00 via INTRAVENOUS
  Filled 2012-03-03 (×16): qty 1000

## 2012-03-03 MED ORDER — POTASSIUM CHLORIDE CRYS ER 20 MEQ PO TBCR
40.0000 meq | EXTENDED_RELEASE_TABLET | ORAL | Status: DC
  Filled 2012-03-03: qty 2

## 2012-03-03 NOTE — Progress Notes (Signed)
Patient was complaining of nausea again and was spitting up clear sputum. RN gave her 4mg  zofran IV at 2351. Patient's daughter stated last time her mom was in the hospital, nausea medicine only made the nausea worse. RN went into room and put a cold wash cloth on her forehead and behind her neck. Patient nodded her head that it helped with the nausea. Patient is now resting in bed, RN will continue to monitor.

## 2012-03-03 NOTE — Care Management Note (Signed)
    Page 1 of 2   03/14/2012     3:49:43 PM   CARE MANAGEMENT NOTE 03/14/2012  Patient:  [facility]   Account Number:  [id number]  Date Initiated:  03/03/2012  Documentation initiated by:  Lanier Clam  Subjective/Objective Assessment:   ADMITTED W/AMS.C DIFF.RECENT CHOLECYSTECTOMY.     Action/Plan:   FROM HOME   Anticipated DC Date:  03/14/2012   Anticipated DC Plan:  SKILLED NURSING FACILITY      DC Planning Services  CM consult      Choice offered to / List presented to:             Status of service:  In process, will continue to follow Medicare Important Message given?   (If response is "NO", the following Medicare IM given date fields will be blank) Date Medicare IM given:   Date Additional Medicare IM given:    Discharge Disposition:    Per UR Regulation:  Reviewed for med. necessity/level of care/duration of stay  If discussed at Long Length of Stay Meetings, dates discussed:   03/09/2012  03/14/2012    Comments:  03/14/12 Toya Palacios RN,BSN NVM [phone] D/C SNF.  03/09/12 Glorian Mcdonell RN,BSN NCM [phone] NEURO FOLLOWING-FOR  LP @ [facility].D/C PLAN SNF.  03/06/12 Amanat Hackel RN,BSN NCM [phone] METABOLIC  ENCEPHALOPATHY,TACHYCARDIA,DELAYED SWALLOWING,ETOH,CONFUSED,WEAK.PT-SNF.  03/03/12 Tarron Krolak RN,BSN NCM [phone] FLEXISEAL.PT-SNF.

## 2012-03-03 NOTE — Progress Notes (Signed)
[facility] PROGRESS NOTE  Abigail Powell WUJ:[id number] DOB: 1950-11-29 DOA: 03/01/2012 PCP: Sheila Oats, MD  Assessment/Plan: 1-Metabolic encephalopathy: at this point without clear etiology. Suspect is related to abnormal electrolytes abnormalities. TSH low and normal free T4; demonstrating subclinical hyperthyroidism. Will no treat at this moment. Will continue IVF's resuscitation and follow electrolytes.  2-Transaminitis: Secondary to recent surgical manipulation most likely; no signs of infection or other abnormalities on CT and [place]. Will hold on calling surgery at this point. LFT's trending down.  3-Physical deconditioning: multifactorial and most likely associated with decrease Powell intake and recent surgery. Patient encourage to increase Powell intake; per PT will benefit from 24/7 supervision and care at home vs SNF. Will follow patient clinical evolution.  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  5-HTN: stable. Continue current regimen.  6-Diarrhea: multiple episodes througout diarrhea; negative C. Diff test; most likely gastroenteritis and/or associated with cholecystectomy. Will start cholestyramine if diarrhea continues.  7-hypokalemia: secondary to diarrhea; will continue repletion. Will check magnesium and phosphorus.  8-metabolic acidosis: secondary to diarrhea; will replete electrolytes and continue IVF's.  9-Nausea and vomiting: symptoms associated with use of probiotics; C. Diff by PCR negative; will d/c florastor and use PRN compazine  10-subclinical hyperthyroidism: will no treat at this point; patient will need repeat labs in 6-8 weeks and if still abnormal then will require treatment.  11-DVT: SCD's   Code Status: Full Family Communication: daughter and son at bedside Disposition Plan: will ask PT to examine and help with discharge decisions.     Consultants:  none  Procedures:  MRI orbit (positive mass with spreading behavior into optic nerve)  Antibiotics:  None  HPI/Subjective: Afebrile, no CP, no SOB; patient continue to be confused and with significant diarrhea overnight requiring flexiseal. Patient also with nausea and vomiting today after florastor started.  Objective: Filed Vitals:   03/02/12 1300 03/02/12 2150 03/03/12 0546 03/03/12 1300  BP: 135/72 139/84 129/75 125/72  Pulse: 85 100 86 80  Temp: 98 F (36.7 C) 98 F (36.7 C) 98.3 F (36.8 C) 98.7 F (37.1 C)  TempSrc:  Oral Oral   Resp: 26 22 22 24   Height:      Weight:      SpO2: 100% 100% 98% 99%    Intake/Output Summary (Last 24 hours) at 03/03/12 1538 Last data filed at 03/03/12 0700  Gross per 24 hour  Intake   1137 ml  Output    190 ml  Net    947 ml   Filed Weights   03/01/12 2333  Weight: 53.4 kg (117 lb 11.6 oz)    Exam:   General:  NAD, afebrile; AAOX2; still confused  Cardiovascular: regular rate, no rubs or gallops; no murmur  Respiratory: CTA bilaterally  Abdomen: soft, NT, ND, positive BS  Extremities: no edema, no erythema, no cyanosis  Neuro: AAOX2; CN intact (except for Left eyes with exophthalmus and abnormal EO movements; no focal deficit appreciated  Data Reviewed: Basic Metabolic Panel:  Lab 03/03/12 9562 03/02/12 0446 03/01/12 1239 02/28/12 0435 02/27/12 0537  NA 141 139 140 134* 134*  K 3.3* 3.0* 3.6 3.9 3.6  CL 113* 109 106 109 107  CO2 16* 16* 14* 13* 14*  GLUCOSE 129* 108* 104* 128* 140*  BUN 4* 8 7 4* 7  CREATININE 0.65 0.67 0.72 0.94 1.05  CALCIUM 8.2* 8.2* 9.5 8.4 8.7  MG -- -- -- -- --  PHOS -- -- -- -- --   Liver Function Tests:  Lab 03/02/12 0446 03/01/12 1239 02/28/12 0435 02/27/12 0537 02/26/12 0530  AST 63* 119* 79* 117* 137*  ALT 92* 140* 125* 150* 157*  ALKPHOS 163* 203* 142* 147* 115  BILITOT 0.5 0.8 1.1 0.9 0.6  PROT 5.6* 7.2 5.2* 5.5* 5.4*  ALBUMIN 2.7* 3.5 2.6* 2.7* 2.8*     Lab 03/01/12 1239 02/25/12 1641  LIPASE 235* 27  AMYLASE -- --    Lab 03/02/12 0446 03/01/12 2105  AMMONIA 47 27   CBC:  Lab 03/02/12 0446 03/01/12 2105 03/01/12 1239 02/28/12 0435 02/27/12 0537 02/26/12 1423  WBC 8.5 -- 5.7 5.0 7.4 6.2  NEUTROABS -- -- 4.8 2.9 -- --  HGB 10.0* 11.8* 14.8 8.8* 9.4* --  HCT 29.2* 34.4* 44.2 26.3* 28.2* --  MCV 88.0 -- 88.9 91.3 91.0 91.1  PLT 365 -- 270 263 310 305   Cardiac Enzymes:  Lab 03/01/12 1239  CKTOTAL --  CKMB --  CKMBINDEX --  TROPONINI <0.30   CBG:  Lab 03/01/12 1910  GLUCAP 72    Recent Results (from the past 240 hour(s))  MRSA PCR SCREENING     Status: Normal   Collection Time   02/26/12 11:35 AM      Component Value Range Status Comment   MRSA by PCR NEGATIVE  NEGATIVE Final   CULTURE, BLOOD (ROUTINE X 2)     Status: Normal (Preliminary result)   Collection Time   03/01/12  9:05 PM      Component Value Range Status Comment   Specimen Description BLOOD RIGHT HAND   Final    Special Requests BOTTLES DRAWN AEROBIC ONLY   Final    Culture  Setup Time 03/02/2012 02:03   Final    Culture     Final    Value:        BLOOD CULTURE RECEIVED NO GROWTH TO DATE CULTURE WILL BE HELD FOR 5 DAYS BEFORE ISSUING A FINAL NEGATIVE REPORT   Report Status PENDING   Incomplete   CULTURE, BLOOD (ROUTINE X 2)     Status: Normal (Preliminary result)   Collection Time   03/01/12  9:10 PM      Component Value Range Status Comment   Specimen Description BLOOD RIGHT ARM   Final    Special Requests BOTTLES DRAWN AEROBIC ONLY   Final    Culture  Setup Time 03/02/2012 02:03   Final    Culture     Final    Value:        BLOOD CULTURE RECEIVED NO GROWTH TO DATE CULTURE WILL BE HELD FOR 5 DAYS BEFORE ISSUING A FINAL NEGATIVE REPORT   Report Status PENDING   Incomplete   CLOSTRIDIUM DIFFICILE BY PCR     Status: Normal   Collection Time   03/02/12  7:35 PM      Component Value Range Status Comment   C difficile by pcr NEGATIVE   NEGATIVE Final      Studies: Dg Chest 1 View  03/01/2012  *RADIOLOGY REPORT*  Clinical Data: Shortness of breath.  CHEST - 1 VIEW  Comparison: No priors.  Findings: Linear opacity at the left bases compatible with subsegmental atelectasis.  No consolidative airspace disease.  No pleural effusions.  Pulmonary vasculature and the cardiomediastinal silhouette are within normal limits.  No pneumothorax.  No suspicious appearing pulmonary nodule or mass.  IMPRESSION: 1.  Minimal subsegmental atelectasis at the left base.  Otherwise, no radiographic evidence of acute cardiopulmonary disease.   Original Report Authenticated By: Florencia Reasons, M.D.    Ct Head Wo Contrast  03/01/2012  *RADIOLOGY REPORT*  Clinical Data: Fatigue.  CT HEAD WITHOUT CONTRAST  Technique:  Contiguous axial images were obtained from the base of the skull through the vertex without contrast.  Comparison: No priors.  Findings: No acute intracranial abnormalities.  Specifically, no definite evidence of vascular territory acute/subacute cerebral ischemia, acute intracerebral hemorrhage, focal mass, mass effect, hydrocephalus or abnormal intra or extra-axial fluid collections. Faint physiologic calcifications are noted in the basal ganglia bilaterally (right greater than left).  Mild cerebral and cerebellar atrophy.  No acute displaced skull fractures are identified.  Visualized paranasal sinuses and mastoids are well pneumatized.  IMPRESSION: 1.  No acute intracranial abnormalities. 2.  Mild cerebral and cerebellar atrophy.   Original Report Authenticated By: Florencia Reasons, M.D.    US Abdomen Complete  03/01/2012  *RADIOLOGY REPORT*  Clinical Data:  Abdominal pain.  Recent cholecystectomy. Hypertension.  COMPLETE ABDOMINAL ULTRASOUND  Comparison:  Multiple exams, including 02/25/2012  Findings:  Gallbladder:  Surgically absent.  Common bile duct:  Measures 6 mm in diameter, within normal limits.  Liver:  Borderline intrahepatic  biliary dilatation.  Otherwise negative.  IVC:  Appears normal.  Pancreas:  The pancreas divisum suggested on recent CT scan is not readily apparent sonographically.  Overall normal.  Spleen:  Measures 4.3 cm craniocaudad, within normal limits.  Right Kidney:  Measures 10.6 cm in length.Equivocally echogenic with respect to the liver, although some of this appearance may be due to the gain profile.  Left Kidney:  Measures 10.6 cm in length and contains a 0.8 cm hyperechoic focus in the lower pole potentially correlating with the small fat density on CT scan, likely a scar or tiny angiomyolipoma.  Abdominal aorta:  No aneurysm identified.  IMPRESSION:  1.  Borderline intrahepatic biliary dilatation, but without ascites along the liver margin. No CBD dilatation.  If there is a high clinical degree of suspicion of bile leak, nuclear medicine hepatic biliary scan would be suggested. 2.  The pancreas divisum suggests on recent CT scan is not readily apparent sonographically. 3.  Tiny scar or angiomyolipoma in the left kidney lower pole. 4.  Equivocally echogenic right kidney.  Although this can be seen in chronic medical renal disease, serum creatinine is noted to be normal.   Original Report Authenticated By: Dellia Cloud, M.D.    Ct Abdomen Pelvis W Contrast  03/01/2012  *RADIOLOGY REPORT*  Clinical Data: Fatigue.  Status post cholecystectomy last Saturday. Evaluate for pancreatitis.  CT ABDOMEN AND PELVIS WITH CONTRAST  Technique:  Multidetector CT imaging of the abdomen and pelvis was performed following the standard protocol during bolus administration of intravenous contrast.  Contrast: 80mL OMNIPAQUE IOHEXOL 300 MG/ML  SOLN  Comparison: CT of the abdomen and pelvis 02/25/2012.  Findings:  Lung Bases: Unremarkable.  Abdomen/Pelvis:  Status post cholecystectomy.  Trace amount of fluid in the gallbladder fossa is within normal limits given the recent history of surgery.  No definite evidence of abscess or  findings to suggest biloma.  There is mild diffuse periportal edema throughout the liver.  The pancreas is morphologically normal in appearance.  Specifically, the pancreatic parenchyma enhances normally, and there are no peripancreatic inflammatory changes to suggest an acute pancreatitis at this time.  The enhanced appearance of the spleen and bilateral adrenal glands  is unremarkable.  Multiple subcentimeter low attenuation lesions are again noted in the kidneys bilaterally, too small to definitively characterize, but statistically favored to represent small cysts.  Trace volume of ascites in the pelvis there is not unexpected given the patient's recent history of surgery.  No larger volume of ascites is noted.  No pneumoperitoneum.  No pathologic distension of small bowel.  A few colonic diverticula are noted, without definite surrounding inflammatory changes to suggest acute diverticulitis at this time.  Normal appendix.  Status post hysterectomy.  Ovaries appear atrophic.  Urinary bladder is unremarkable in appearance.  Musculoskeletal: There are no aggressive appearing lytic or blastic lesions noted in the visualized portions of the skeleton.  IMPRESSION: 1.  Postoperative changes of recent cholecystectomy, as above, without definite acute complicating features. 2.  Mild periportal edema in the liver is nonspecific.  Correlation with liver function tests is recommended. 3.  The pancreas is grossly normal in appearance.  Specifically, no imaging findings to suggest the presence of pancreatitis at this time. 4.  Trace volume of ascites. 5.  Additional incidental findings, as above.   Original Report Authenticated By: Florencia Reasons, M.D.    Mr Orbits Wo/w Cm  03/02/2012  *RADIOLOGY REPORT*  Clinical Data: Evaluate left orbital tumor.  MRI ORBITS WITHOUT AND WITH CONTRAST  Technique:  Multiplanar and multiecho pulse sequences of the orbits and surrounding structures were obtained including fat saturation  techniques, before and after intravenous contrast administration.  Contrast: 10mL MULTIHANCE GADOBENATE DIMEGLUMINE 529 MG/ML IV SOLN  Comparison: CT head 03/01/2012  Findings: Enhancing mass in the left posterior orbit in the orbital apex.  This is an intraconal mass and is compressing the optic nerve.  The mass shows intense and homogeneous enhancement.  The main component of mass measures 18 x 11 mm. The margins of the mass are ill-defined.  The mass extends through the optic canal and into the anterior aspect of the cavernous sinus.  The mass also spread along the dural surface of the middle cranial fossa on the left. There is edema in the orbital fat on the left likely due to venous congestion.  The extraocular muscles are not enlarged.  There is exophthalmos on the left.  The right orbit is normal.  Paranasal sinuses are clear. Visualized brain on this limited study of the brain is negative.  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  The mass extends through the optic canal into the cavernous sinus with spread along the anterior dural surface of the middle cranial fossa.  Differential considerations include metastatic disease with perineural spread.  Lymphoma and meningioma are  other possibilities.   Original Report Authenticated By: Camelia Phenes, M.D.     Scheduled Meds:    . amLODipine  5 mg Oral Daily   And  . irbesartan  150 mg Oral Daily  . feeding supplement  1 Container Oral TID BM  . influenza  inactive virus vaccine  0.5 mL Intramuscular Tomorrow-1000  . potassium chloride  40 mEq Oral Q4H  . promethazine  12.5 mg Intravenous Once  . sodium bicarbonate  650 mg Oral TID  . sodium chloride  3 mL Intravenous Q12H  . DISCONTD: potassium chloride  40 mEq Oral Q4H  . DISCONTD: saccharomyces boulardii  250 mg Oral BID   Continuous Infusions:    . dextrose 5 % and 0.2 % NaCl with KCl 20 mEq 75 mL/hr (03/03/12  1315)  . DISCONTD: 0.9 % NaCl with KCl 20 mEq / L 75 mL/hr at 03/03/12 0053    Time spent: >30 minutes    Sascha Palma  [facility] Pager [phone]. If 8PM-8AM, please contact night-coverage at [email or web address], password [facility] 03/03/2012, 3:38 PM  LOS: 2 days

## 2012-03-03 NOTE — Evaluation (Signed)
Physical Therapy Evaluation Patient Details Name: Abigail Powell MRN: [id number] DOB: [birth year] Today's Date: 03/03/2012 Time: 2951-8841 PT Time Calculation (min): 23 min  PT Assessment / Plan / Recommendation Clinical Impression  61 y.o. female admitted with acute encephalopathy. +2 for OOB to chair. Pt requires 24* total care at present, SNF recommended if family unable to provide care. Pt would benefit from acute PT to maximize safety and independence with mobiltiy.     PT Assessment  Patient needs continued PT services    Follow Up Recommendations  Post acute inpatient    Does the patient have the potential to tolerate intense rehabilitation   No, Recommend SNF  Barriers to Discharge        Equipment Recommendations  Rolling walker with 5" wheels;3 in 1 bedside comode;Tub/shower seat    Recommendations for Other Services OT consult   Frequency Min 3X/week    Precautions / Restrictions Precautions Precautions: Fall Restrictions Weight Bearing Restrictions: No   Pertinent Vitals/Pain *no c/o pain**      Mobility  Bed Mobility Bed Mobility: Supine to Sit Supine to Sit: 1: +2 Total assist Supine to Sit: Patient Percentage: 10% Transfers Transfers: Sit to Stand;Stand to Sit;Stand Pivot Transfers Sit to Stand: 1: +2 Total assist;From bed Sit to Stand: Patient Percentage: 10% Stand to Sit: 1: +2 Total assist;To chair/3-in-1 Stand to Sit: Patient Percentage: 10% Stand Pivot Transfers: 1: +2 Total assist Stand Pivot Transfers: Patient Percentage: 20% Details for Transfer Assistance: pivot with RW, +2 total assist, VCs for sequencing    Shoulder Instructions     Exercises     PT Diagnosis: Difficulty walking;Generalized weakness;Altered mental status  PT Problem List: Decreased mobility;Decreased cognition;Decreased activity tolerance PT Treatment Interventions: DME instruction;Gait training;Functional mobility training;Therapeutic activities;Patient/family education     PT Goals Acute Rehab PT Goals PT Goal Formulation: With patient/family Time For Goal Achievement: 03/17/12 Potential to Achieve Goals: Fair Pt will go Supine/Side to Sit: with min assist PT Goal: Supine/Side to Sit - Progress: Goal set today Pt will go Sit to Stand: with min assist PT Goal: Sit to Stand - Progress: Goal set today Pt will Ambulate: 1 - 15 feet;with min assist;with least restrictive assistive device PT Goal: Ambulate - Progress: Goal set today  Visit Information  Last PT Received On: 03/03/12 Assistance Needed: +2    Subjective Data  Subjective: I just finished physical therapy.  Patient Stated Goal: none stated   Prior Functioning  Home Living Lives With: Daughter Available Help at Discharge: Family Home Layout: Two level Home Adaptive Equipment: None Prior Function Level of Independence: Independent Able to Take Stairs?: Yes Driving: Yes Communication Communication: No difficulties    Cognition  Overall Cognitive Status: Impaired Area of Impairment: Memory;Awareness of deficits Arousal/Alertness: Awake/alert Orientation Level: Situation;Time Behavior During Session: [facility] for tasks performed    Extremity/Trunk Assessment Right Upper Extremity Assessment RUE ROM/Strength/Tone: [facility] for tasks assessed Left Upper Extremity Assessment LUE ROM/Strength/Tone: WFL for tasks assessed Right Lower Extremity Assessment RLE ROM/Strength/Tone: Unable to fully assess;Due to impaired cognition Left Lower Extremity Assessment LLE ROM/Strength/Tone: Unable to fully assess;Due to impaired cognition Trunk Assessment Trunk Assessment: Normal   Balance    End of Session PT - End of Session Equipment Utilized During Treatment: Gait belt Activity Tolerance: Patient limited by fatigue Patient left: in chair;with call bell/phone within reach Nurse Communication: Mobility status  GP     Abigail Powell 03/03/2012, 1:25 PM  [phone]

## 2012-03-03 NOTE — Evaluation (Deleted)
Physical Therapy Evaluation Patient Details Name: Abigail Powell MRN: [id number] DOB: Mar 15, 1951 Today's Date: 03/03/2012 Time: 6962-9528 PT Time Calculation (min): 23 min  PT Assessment / Plan / Recommendation Clinical Impression       PT Assessment       Follow Up Recommendations       Does the patient have the potential to tolerate intense rehabilitation      Barriers to Discharge        Equipment Recommendations       Recommendations for Other Services     Frequency      Precautions / Restrictions Precautions Precautions: Fall Restrictions Weight Bearing Restrictions: No   Pertinent Vitals/Pain *no c/o pain**      Mobility  Bed Mobility Bed Mobility: Supine to Sit Supine to Sit: 1: +2 Total assist Supine to Sit: Patient Percentage: 10% Transfers Transfers: Sit to Stand;Stand to Sit;Stand Pivot Transfers Sit to Stand: 1: +2 Total assist;From bed Sit to Stand: Patient Percentage: 10% Stand to Sit: 1: +2 Total assist;To chair/3-in-1 Stand to Sit: Patient Percentage: 10% Stand Pivot Transfers: 1: +2 Total assist Stand Pivot Transfers: Patient Percentage: 20% Details for Transfer Assistance: pivot with RW, +2 total assist, VCs for sequencing    Shoulder Instructions     Exercises     PT Diagnosis:    PT Problem List:   PT Treatment Interventions:     PT Goals Acute Rehab PT Goals PT Goal Formulation: With patient/family Time For Goal Achievement: 03/17/12 Potential to Achieve Goals: Fair Pt will go Supine/Side to Sit: with mod assist PT Goal: Supine/Side to Sit - Progress: Goal set today Pt will go Sit to Stand: with mod assist PT Goal: Sit to Stand - Progress: Goal set today Pt will Ambulate: 1 - 15 feet;with mod assist;with rolling walker PT Goal: Ambulate - Progress: Goal set today  Visit Information  Last PT Received On: 03/03/12 Assistance Needed: +2    Subjective Data  Subjective: I just finished physical therapy.  Patient Stated Goal:  none stated   Prior Functioning  Home Living Lives With: Daughter Available Help at Discharge: Family Home Layout: Two level Home Adaptive Equipment: None Prior Function Level of Independence: Independent Able to Take Stairs?: Yes Driving: Yes Communication Communication: No difficulties    Cognition  Overall Cognitive Status: Impaired Area of Impairment: Memory;Awareness of deficits Arousal/Alertness: Awake/alert Orientation Level: Situation;Time Behavior During Session: [facility] for tasks performed    Extremity/Trunk Assessment Right Upper Extremity Assessment RUE ROM/Strength/Tone: [facility] for tasks assessed Left Upper Extremity Assessment LUE ROM/Strength/Tone: WFL for tasks assessed Right Lower Extremity Assessment RLE ROM/Strength/Tone: Unable to fully assess;Due to impaired cognition Left Lower Extremity Assessment LLE ROM/Strength/Tone: Unable to fully assess;Due to impaired cognition Trunk Assessment Trunk Assessment: Normal   Balance    End of Session    GP     [name] 03/03/2012, 1:18 PM  [phone]

## 2012-03-03 NOTE — Progress Notes (Signed)
Patient's family and friends were educated thoroughly about the contact precautions. Night RN told them multiple times, if the patient has a stool infection then it is spread via contact. RN went over the importance of wearing the gown and gloves. I also told them it is very important to use soap and water before leaving the room.

## 2012-03-04 ENCOUNTER — Inpatient Hospital Stay (HOSPITAL_COMMUNITY): Payer: BC Managed Care – HMO

## 2012-03-04 LAB — URINE CULTURE: Culture: NO GROWTH

## 2012-03-04 LAB — BASIC METABOLIC PANEL
Chloride: 110 mEq/L (ref 96–112)
Creatinine, Ser: 0.64 mg/dL (ref 0.50–1.10)
GFR calc Af Amer: >90 mL/min (ref >90)
Potassium: 3.3 mEq/L — ABNORMAL LOW (ref 3.5–5.1)
Sodium: 140 mEq/L (ref 135–145)

## 2012-03-04 LAB — PHOSPHORUS: Phosphorus: 2.5 mg/dL (ref 2.3–4.6)

## 2012-03-04 LAB — MAGNESIUM: Magnesium: 1.1 mg/dL — ABNORMAL LOW (ref 1.5–2.5)

## 2012-03-04 MED ORDER — MAGNESIUM SULFATE 40 MG/ML IJ SOLN
2.0000 g | Freq: Once | INTRAMUSCULAR | Status: DC
  Filled 2012-03-04: qty 50

## 2012-03-04 MED ORDER — TECHNETIUM TC 99M MEBROFENIN IV KIT
5.4000 | PACK | Freq: Once | INTRAVENOUS | Status: AC | PRN
  Administered 2012-03-04: 5 via INTRAVENOUS

## 2012-03-04 MED ORDER — PANTOPRAZOLE SODIUM 40 MG IV SOLR
40.0000 mg | INTRAVENOUS | Status: DC
  Administered 2012-03-05 – 2012-03-09 (×6): 40 mg via INTRAVENOUS
  Filled 2012-03-04 (×7): qty 40

## 2012-03-04 NOTE — Progress Notes (Signed)
[facility] PROGRESS NOTE  Abigail Powell EAV:[id number] DOB: 03-04-1951 DOA: 03/01/2012 PCP: Sheila Oats, MD  Assessment/Plan: 1-Metabolic encephalopathy: at this point without clear etiology. Suspect is related to abnormal electrolytes, starvation and acidosis. TSH low and normal free T4; demonstrating subclinical hyperthyroidism (might be also playing at role), do not require treatment at this moment. Will continue IVF's resuscitation and repletion of electrolytes.   2-Transaminitis: Secondary to recent surgical manipulation most likely; no signs of infection or other abnormalities on CT and [place]. Surgery curbside and has recommended to perform hida scan in order to r/o bile leak. Depending results will ask GI to see patient.  3-Physical deconditioning: multifactorial and most likely associated with decrease PO intake and recent surgery. Patient encourage to increase PO intake; per PT will benefit from 24/7 supervision and care at home vs SNF. Will follow patient clinical evolution.  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  5-HTN: stable. Continue current regimen.  6-Diarrhea: no further episodes now. negative C. Diff test; most likely gastroenteritis and/or associated with cholecystectomy. Will start cholestyramine if diarrhea continues. Per surgery rec's will check hida scan to r/o biliary leak.  7-hypokalemia: secondary to diarrhea; will continue repletion. Will replete Mg; phosphorus WNL.  8-metabolic acidosis: secondary to diarrhea and starvation. Improved. Continue bicarb; continue IVF's.  9-Nausea and vomiting: symptoms associated with use of probiotics and recent cholecystectomy; C. Diff by PCR negative; improved after florastor disocontinue. Will use PRN compazine, start protonix IV and continue supportive care.  10-subclinical hyperthyroidism: will no treat at this point;  patient will need repeat labs in 6-8 weeks and if still abnormal then will require treatment.  11-Hypomagenesimia: will replete    DVT: SCD's   Code Status: Full Family Communication: daughter and son at bedside Disposition Plan: will ask PT to examine and help with discharge decisions.    Consultants:  none  Procedures:  MRI orbit (positive mass with spreading behavior into optic nerve)  Antibiotics:  None  HPI/Subjective: Low grade temp during the night, no CP, no SOB; patient continue to be confused and with mild nausea and vomiting today; no further diarrhea.  Objective: Filed Vitals:   03/03/12 0546 03/03/12 1300 03/03/12 2200 03/04/12 0500  BP: 129/75 125/72 142/81 139/90  Pulse: 86 80 78 87  Temp: 98.3 F (36.8 C) 98.7 F (37.1 C) 100.5 F (38.1 C) 99.1 F (37.3 C)  TempSrc: Oral  Oral Oral  Resp: 22 24 18 22   Height:      Weight:      SpO2: 98% 99% 100% 97%    Intake/Output Summary (Last 24 hours) at 03/04/12 1352 Last data filed at 03/03/12 1500  Gross per 24 hour  Intake 131.25 ml  Output   1600 ml  Net -1468.75 ml   Filed Weights   03/01/12 2333  Weight: 53.4 kg (117 lb 11.6 oz)    Exam:   General:  NAD, afebrile; AAOX2; still confused  Cardiovascular: regular rate, no rubs or gallops; no murmur  Respiratory: CTA bilaterally  Abdomen: soft, NT, ND, positive BS  Extremities: no edema, no erythema, no cyanosis  Neuro: AAOX2; CN intact (except for Left eyes with exophthalmus and abnormal EO movements; no focal deficit appreciated  Data Reviewed: Basic Metabolic Panel:  Lab 03/04/12 7829 03/03/12 0441 03/02/12 0446 03/01/12 1239 02/28/12 0435  NA 140 141 139 140 134*  K 3.3* 3.3* 3.0* 3.6  3.9  CL 110 113* 109 106 109  CO2 18* 16* 16* 14* 13*  GLUCOSE 121* 129* 108* 104* 128*  BUN 3* 4* 8 7 4*  CREATININE 0.64 0.65 0.67 0.72 0.94  CALCIUM 8.4 8.2* 8.2* 9.5 8.4  MG 1.1* -- -- -- --  PHOS 2.5 -- -- -- --   Liver Function  Tests:  Lab 03/02/12 0446 03/01/12 1239 02/28/12 0435 02/27/12 0537  AST 63* 119* 79* 117*  ALT 92* 140* 125* 150*  ALKPHOS 163* 203* 142* 147*  BILITOT 0.5 0.8 1.1 0.9  PROT 5.6* 7.2 5.2* 5.5*  ALBUMIN 2.7* 3.5 2.6* 2.7*    Lab 03/01/12 1239  LIPASE 235*  AMYLASE --    Lab 03/02/12 0446 03/01/12 2105  AMMONIA 47 27   CBC:  Lab 03/02/12 0446 03/01/12 2105 03/01/12 1239 02/28/12 0435 02/27/12 0537 02/26/12 1423  WBC 8.5 -- 5.7 5.0 7.4 6.2  NEUTROABS -- -- 4.8 2.9 -- --  HGB 10.0* 11.8* 14.8 8.8* 9.4* --  HCT 29.2* 34.4* 44.2 26.3* 28.2* --  MCV 88.0 -- 88.9 91.3 91.0 91.1  PLT 365 -- 270 263 310 305   Cardiac Enzymes:  Lab 03/01/12 1239  CKTOTAL --  CKMB --  CKMBINDEX --  TROPONINI <0.30   CBG:  Lab 03/01/12 1910  GLUCAP 72    Recent Results (from the past 240 hour(s))  MRSA PCR SCREENING     Status: Normal   Collection Time   02/26/12 11:35 AM      Component Value Range Status Comment   MRSA by PCR NEGATIVE  NEGATIVE Final   CULTURE, BLOOD (ROUTINE X 2)     Status: Normal (Preliminary result)   Collection Time   03/01/12  9:05 PM      Component Value Range Status Comment   Specimen Description BLOOD RIGHT HAND   Final    Special Requests BOTTLES DRAWN AEROBIC ONLY   Final    Culture  Setup Time 03/02/2012 02:03   Final    Culture     Final    Value:        BLOOD CULTURE RECEIVED NO GROWTH TO DATE CULTURE WILL BE HELD FOR 5 DAYS BEFORE ISSUING A FINAL NEGATIVE REPORT   Report Status PENDING   Incomplete   CULTURE, BLOOD (ROUTINE X 2)     Status: Normal (Preliminary result)   Collection Time   03/01/12  9:10 PM      Component Value Range Status Comment   Specimen Description BLOOD RIGHT ARM   Final    Special Requests BOTTLES DRAWN AEROBIC ONLY   Final    Culture  Setup Time 03/02/2012 02:03   Final    Culture     Final    Value:        BLOOD CULTURE RECEIVED NO GROWTH TO DATE CULTURE WILL BE HELD FOR 5 DAYS BEFORE ISSUING A FINAL NEGATIVE  REPORT   Report Status PENDING   Incomplete   CLOSTRIDIUM DIFFICILE BY PCR     Status: Normal   Collection Time   03/02/12  7:35 PM      Component Value Range Status Comment   C difficile by pcr NEGATIVE  NEGATIVE Final      Studies: No results found.  Scheduled Meds:    . amLODipine  5 mg Oral Daily   And  . irbesartan  150 mg Oral Daily  . feeding supplement  1 Container Oral TID BM  .  influenza  inactive virus vaccine  0.5 mL Intramuscular Tomorrow-1000  . magnesium sulfate 1 - 4 g bolus IVPB  2 g Intravenous Once  . pantoprazole (PROTONIX) IV  40 mg Intravenous Q24H  . sodium bicarbonate  650 mg Oral TID  . sodium chloride  3 mL Intravenous Q12H  . DISCONTD: potassium chloride  40 mEq Oral Q4H  . DISCONTD: saccharomyces boulardii  250 mg Oral BID   Continuous Infusions:    . dextrose 5 % and 0.2 % NaCl with KCl 20 mEq 100 mL/hr at 03/04/12 0055    Time spent: >30 minutes    Claritza July  [facility] Pager [phone]. If 8PM-8AM, please contact night-coverage at [email or web address], password [facility] 03/04/2012, 1:52 PM  LOS: 3 days

## 2012-03-05 LAB — BASIC METABOLIC PANEL
BUN: 5 mg/dL — ABNORMAL LOW (ref 6–23)
GFR calc Af Amer: >90 mL/min (ref >90)
GFR calc non Af Amer: >90 mL/min (ref >90)
Potassium: 3 mEq/L — ABNORMAL LOW (ref 3.5–5.1)

## 2012-03-05 MED ORDER — SODIUM BICARBONATE 650 MG PO TABS
650.0000 mg | ORAL_TABLET | Freq: Three times a day (TID) | ORAL | Status: AC
  Administered 2012-03-05 – 2012-03-07 (×6): 650 mg via ORAL
  Filled 2012-03-05 (×6): qty 1

## 2012-03-05 MED ORDER — INFLUENZA VIRUS VACC SPLIT PF IM SUSP
0.5000 mL | Freq: Once | INTRAMUSCULAR | Status: AC
  Administered 2012-03-05: 0.5 mL via INTRAMUSCULAR
  Filled 2012-03-05: qty 0.5

## 2012-03-05 MED ORDER — POTASSIUM CHLORIDE CRYS ER 20 MEQ PO TBCR
40.0000 meq | EXTENDED_RELEASE_TABLET | ORAL | Status: AC
  Administered 2012-03-05 (×3): 40 meq via ORAL
  Filled 2012-03-05 (×3): qty 2

## 2012-03-05 NOTE — Consult Note (Signed)
Reason for Consult: Nausea, vomiting and abdominal pain. Referring Physician: [facility]-Dr. Gwenlyn Perking.  Abigail Powell is an 61 y.o. female.  HPI: I have reviewed the patient chart at length. I have spoken to the patient's daughter and the nurse aide who are also at the bedside. Apparently, the patient was admitted with nausea and vomiting and some abdominal pain and lethargy but at the present time, she denies any of these problems. She claims she has kept her dinner down and her appetite is improving. She had a laparoscopic cholecystectomy with a normal IOC on 02/25/12 for cholelithiasis. She reportedly had some post-operative diarrhea that has also resolved. Had abnormal LFT's on 03/02/12 but I do not see these being rechecked after that. When I asked the patient as to why she was here she said "maybe I had a stroke". Has a metabolic encephalopathy of unclear etiology.  Past Medical History  Diagnosis Date  . Diverticulosis   . Hypertension   . 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 H/O gastric ulcer    Past Surgical History  Procedure Date  . Laparoscopic cholecystectomy 02/26/2012    Procedure: LAPAROSCOPIC CHOLECYSTECTOMY WITH INTRAOPERATIVE CHOLANGIOGRAM;  Surgeon: Lodema Pilot, DO;  Location: [facility];  Service: General;  Laterality: N/A;  . Abdominal hysterectomy   . Arthoscopic knee surgery    Family History  Problem Relation Age of Onset  . Ovarian cancer    . Prostate cancer    . Hypertension     Social History:  reports that she has never smoked. She has never used smokeless tobacco. She reports that she does not drink alcohol or use illicit drugs.  Allergies:  Allergies  Allergen Reactions  . Asa (Aspirin) Other (See Comments)    Causes ulcer   Medications: I have reviewed the patient's current medications.  Results for orders placed during the hospital encounter of 03/01/12 (from the past 48 hour(s))  BASIC METABOLIC PANEL     Status: Abnormal   Collection Time   03/04/12  5:20 AM      Component Value Range Comment   Sodium 140  135 - 145 mEq/L    Potassium 3.3 (*) 3.5 - 5.1 mEq/L    Chloride 110  96 - 112 mEq/L    CO2 18 (*) 19 - 32 mEq/L    Glucose, Bld 121 (*) 70 - 99 mg/dL    BUN 3 (*) 6 - 23 mg/dL    Creatinine, Ser 9.60  0.50 - 1.10 mg/dL    Calcium 8.4  8.4 - 45.4 mg/dL    GFR calc non Af Amer >90  >90 mL/min    GFR calc Af Amer >90  >90 mL/min   MAGNESIUM     Status: Abnormal   Collection Time   03/04/12  5:20 AM      Component Value Range Comment   Magnesium 1.1 (*) 1.5 - 2.5 mg/dL   PHOSPHORUS     Status: Normal   Collection Time   03/04/12  5:20 AM      Component Value Range Comment   Phosphorus 2.5  2.3 - 4.6 mg/dL   BASIC METABOLIC PANEL     Status: Abnormal   Collection Time   03/05/12  4:52 AM      Component Value Range Comment   Sodium 143  135 - 145 mEq/L    Potassium 3.0 (*) 3.5 - 5.1 mEq/L    Chloride 111  96 -  112 mEq/L    CO2 17 (*) 19 - 32 mEq/L    Glucose, Bld 184 (*) 70 - 99 mg/dL    BUN 5 (*) 6 - 23 mg/dL    Creatinine, Ser 5.62  0.50 - 1.10 mg/dL    Calcium 8.5  8.4 - 13.0 mg/dL    GFR calc non Af Amer >90  >90 mL/min    GFR calc Af Amer >90  >90 mL/min    Nm Hepatobiliary Liver Func  03/04/2012  *RADIOLOGY REPORT*  Clinical Data:  61 year old female with continued abdominal pain following cholecystectomy.  Evaluate for bile leak.  NUCLEAR MEDICINE HEPATOBILIARY IMAGING  Technique:  Sequential images of the abdomen were obtained out to 60 minutes following intravenous administration of radiopharmaceutical.  Radiopharmaceutical:  5.53mCi Tc-11m Choletec  Comparison:  03/01/2012 CT  Findings: Prompt homogeneous hepatic activity is identified. Activity within the CBD is identified at 5 minutes. Activity within the small bowel is identified at 5-10 minutes. There is no evidence of bile leak. No abnormalities are identified.  IMPRESSION: No evidence of bile leak.  No significant abnormalities  identified.   Original Report Authenticated By: Rosendo Gros, M.D.    Dg Abd Acute W/chest  03/04/2012  *RADIOLOGY REPORT*  Clinical Data: Nausea, vomiting, abdominal pain, fever  ACUTE ABDOMEN SERIES (ABDOMEN 2 VIEW & CHEST 1 VIEW)  Comparison: 03/01/2012  Findings: There is some linear scarring, infiltrate, or subsegmental atelectasis at the left lung base.   Lungs otherwise clear. No free air. No effusion.  Normal bowel gas pattern.  Right pelvic phlebolith.  Regional bones unremarkable.  Heart size normal. Vascular clips in the right mid abdomen.  IMPRESSION: 1.  Normal bowel gas pattern. 2.  No free air. 3.  Subsegmental atelectasis or infiltrate at the left lung base.   Original Report Authenticated By: Osa Craver, M.D.    Review of Systems  Constitutional: Positive for weight loss and malaise/fatigue. Negative for fever, chills and diaphoresis.  HENT: Positive for neck pain.   Eyes: Positive for pain, discharge and redness.  Respiratory: Negative.   Cardiovascular: Negative.   Gastrointestinal: Positive for diarrhea. Negative for heartburn, nausea, vomiting, abdominal pain, blood in stool and melena.  Genitourinary: Negative.   Musculoskeletal: Positive for myalgias, back pain and joint pain.  Skin: Negative.   Neurological: Positive for dizziness and weakness. Negative for tremors and sensory change.   Blood pressure 133/77, pulse 99, temperature 98.6 F (37 C), temperature source Oral, resp. rate 18, height 5' 2.5" (1.588 m), weight 53.4 kg (117 lb 11.6 oz), SpO2 100.00%. Physical Exam  Constitutional: She is oriented to person, place, and time. She appears well-developed and well-nourished.  HENT:  Head: Normocephalic and atraumatic.  Eyes:       Left eye is taped up; right eye appears normal  Neck: Normal range of motion. Neck supple.  Cardiovascular: Normal rate and regular rhythm.   Respiratory: Effort normal and breath sounds normal.  GI: Soft. Bowel sounds are  normal.  Musculoskeletal: Normal range of motion.  Neurological: She is alert and oriented to person, place, and time.  Skin: Skin is warm and dry.  Psychiatric: She has a normal mood and affect. Her behavior is normal. Thought content normal.   Assessment/Plan: 1) Abnormal LFT's s/p laparoscopic cholecystectomy for cholelithiasis. Will recheck a liver panel in am.  2) Nausea/vomiting/abdominal pain: seems to have resolved.  3) ?Metabolic encephalopathy. 4) HTN/Hyperlipidemia. 5) Colonic diverticulosis.  Nichelle Renwick 03/05/2012, 5:35 PM

## 2012-03-05 NOTE — Progress Notes (Signed)
[facility] PROGRESS NOTE  Abigail Powell ZOX:[id number] DOB: [birth year] DOA: 03/01/2012 PCP: Sheila Oats, MD  Assessment/Plan: 1-Metabolic encephalopathy: at this point without clear etiology. Suspect is related to abnormal electrolytes, starvation and acidosis. TSH low and normal free T4; demonstrating subclinical hyperthyroidism (might be also playing a role), do not require treatment at this moment. Will continue IVF's resuscitation and repletion of electrolytes. Supportive care and further rec's as per GI if any at this moment. Patient today is looking better and is willing to try some diet.  2-Transaminitis: Secondary to recent surgical manipulation most likely; no signs of infection or other abnormalities on CT and [place]. Surgery curbside and has recommended hida scan, which is demonstrating no bile leak. Will follow rec's from GI.  3-Physical deconditioning: multifactorial and most likely associated with decrease PO intake and recent surgery. Patient encourage to increase PO intake; per PT will benefit from 24/7 supervision and care at home vs SNF. Will follow patient clinical evolution.  4-Left eye tumor: MRI suggesting lymphoma vs meningioma or any other mass due to image finding; patient to be seen by eye specialist in [facility].  5-HTN: stable. Continue current regimen.  6-Diarrhea: no further episodes now. negative C. Diff test; most likely gastroenteritis and/or associated with cholecystectomy. Will start cholestyramine if diarrhea continues. Per surgery rec's hida scan checked and no bile leak or abnormalities. GI to see patient today; will follow recommendations.  7-hypokalemia: secondary to diarrhea; will continue repletion.   8-metabolic acidosis: secondary to diarrhea and starvation. Improved. Continue bicarb; continue IVF's. Advance diet  9-Nausea and vomiting: symptoms associated with use of probiotics and recent cholecystectomy vs dyspepsia; C. Diff by PCR negative;  improved after florastor disocontinue. Will continue using PRN compazine, continue protonix IV and will follow GI rec's. Had hx of PUD in the past. Surgery has seen the patient and at this point no further rec's and no complications appreciated.  10-subclinical hyperthyroidism: will no treat at this point; patient will need repeat labs in 6-8 weeks and if still abnormal then will require treatment.  11-Hypomagenesimia: will replete; Mg in am -secondary to episodes of diarrhea and decrease PO intake   DVT: SCD's   Code Status: Full Family Communication: daughter and son at bedside Disposition Plan: will ask PT to examine and help with discharge decisions.    Consultants:  [facility]  GI  Procedures:  MRI orbit (positive mass with spreading behavior into optic nerve)  Hida scan (no bile leak)  Antibiotics:  None  HPI/Subjective: afebrile, no CP, no SOB; patient is less confused and today without any further vomiting. Still with some nausea; but no further diarrhea.  Objective: Filed Vitals:   03/04/12 1357 03/04/12 2208 03/05/12 0446 03/05/12 0557  BP: 138/80 139/80 134/79 144/89  Pulse: 101 98 100 95  Temp: 99.2 F (37.3 C) 98.8 F (37.1 C) 98.4 F (36.9 C) 98.6 F (37 C)  TempSrc: Oral Oral Oral Oral  Resp: 20 18 18 16   Height:      Weight:      SpO2: 99% 97% 100% 100%    Intake/Output Summary (Last 24 hours) at 03/05/12 1226 Last data filed at 03/05/12 0700  Gross per 24 hour  Intake   1160 ml  Output   1400 ml  Net   -240 ml   Filed Weights   03/01/12 2333  Weight: 53.4 kg (117 lb 11.6 oz)    Exam:   General:  NAD, afebrile; AAOX2; still mild confused  Cardiovascular: regular rate,  no rubs or gallops; no murmur  Respiratory: CTA bilaterally  Abdomen: soft, mild discomfort on palpation on her RUQ, ND, positive BS  Extremities: no edema, no erythema, no cyanosis  Neuro: AAOX2; CN intact (except for Left eyes with exophthalmus and abnormal EO  movements; no focal deficit appreciated  Data Reviewed: Basic Metabolic Panel:  Lab 03/05/12 1610 03/04/12 0520 03/03/12 0441 03/02/12 0446 03/01/12 1239  NA 143 140 141 139 140  K 3.0* 3.3* 3.3* 3.0* 3.6  CL 111 110 113* 109 106  CO2 17* 18* 16* 16* 14*  GLUCOSE 184* 121* 129* 108* 104*  BUN 5* 3* 4* 8 7  CREATININE 0.61 0.64 0.65 0.67 0.72  CALCIUM 8.5 8.4 8.2* 8.2* 9.5  MG -- 1.1* -- -- --  PHOS -- 2.5 -- -- --   Liver Function Tests:  Lab 03/02/12 0446 03/01/12 1239 02/28/12 0435  AST 63* 119* 79*  ALT 92* 140* 125*  ALKPHOS 163* 203* 142*  BILITOT 0.5 0.8 1.1  PROT 5.6* 7.2 5.2*  ALBUMIN 2.7* 3.5 2.6*    Lab 03/01/12 1239  LIPASE 235*  AMYLASE --    Lab 03/02/12 0446 03/01/12 2105  AMMONIA 47 27   CBC:  Lab 03/02/12 0446 03/01/12 2105 03/01/12 1239 02/28/12 0435  WBC 8.5 -- 5.7 5.0  NEUTROABS -- -- 4.8 2.9  HGB 10.0* 11.8* 14.8 8.8*  HCT 29.2* 34.4* 44.2 26.3*  MCV 88.0 -- 88.9 91.3  PLT 365 -- 270 263   Cardiac Enzymes:  Lab 03/01/12 1239  CKTOTAL --  CKMB --  CKMBINDEX --  TROPONINI <0.30   CBG:  Lab 03/01/12 1910  GLUCAP 72    Recent Results (from the past 240 hour(s))  MRSA PCR SCREENING     Status: Normal   Collection Time   02/26/12 11:35 AM      Component Value Range Status Comment   MRSA by PCR NEGATIVE  NEGATIVE Final   CULTURE, BLOOD (ROUTINE X 2)     Status: Normal (Preliminary result)   Collection Time   03/01/12  9:05 PM      Component Value Range Status Comment   Specimen Description BLOOD RIGHT HAND   Final    Special Requests BOTTLES DRAWN AEROBIC ONLY   Final    Culture  Setup Time 03/02/2012 02:03   Final    Culture     Final    Value:        BLOOD CULTURE RECEIVED NO GROWTH TO DATE CULTURE WILL BE HELD FOR 5 DAYS BEFORE ISSUING A FINAL NEGATIVE REPORT   Report Status PENDING   Incomplete   CULTURE, BLOOD (ROUTINE X 2)     Status: Normal (Preliminary result)   Collection Time   03/01/12  9:10 PM      Component  Value Range Status Comment   Specimen Description BLOOD RIGHT ARM   Final    Special Requests BOTTLES DRAWN AEROBIC ONLY   Final    Culture  Setup Time 03/02/2012 02:03   Final    Culture     Final    Value:        BLOOD CULTURE RECEIVED NO GROWTH TO DATE CULTURE WILL BE HELD FOR 5 DAYS BEFORE ISSUING A FINAL NEGATIVE REPORT   Report Status PENDING   Incomplete   CLOSTRIDIUM DIFFICILE BY PCR     Status: Normal   Collection Time   03/02/12  7:35 PM      Component Value Range  Status Comment   C difficile by pcr NEGATIVE  NEGATIVE Final   URINE CULTURE     Status: Normal   Collection Time   03/03/12  1:34 PM      Component Value Range Status Comment   Specimen Description URINE, CATHETERIZED   Final    Special Requests NONE   Final    Culture  Setup Time 03/04/2012 01:12   Final    Colony Count NO GROWTH   Final    Culture NO GROWTH   Final    Report Status 03/04/2012 FINAL   Final      Studies: Nm Hepatobiliary Liver Func  03/04/2012  *RADIOLOGY REPORT*  Clinical Data:  61 year old female with continued abdominal pain following cholecystectomy.  Evaluate for bile leak.  NUCLEAR MEDICINE HEPATOBILIARY IMAGING  Technique:  Sequential images of the abdomen were obtained out to 60 minutes following intravenous administration of radiopharmaceutical.  Radiopharmaceutical:  5.54mCi Tc-27m Choletec  Comparison:  03/01/2012 CT  Findings: Prompt homogeneous hepatic activity is identified. Activity within the CBD is identified at 5 minutes. Activity within the small bowel is identified at 5-10 minutes. There is no evidence of bile leak. No abnormalities are identified.  IMPRESSION: No evidence of bile leak.  No significant abnormalities identified.   Original Report Authenticated By: Rosendo Gros, M.D.    Dg Abd Acute W/chest  03/04/2012  *RADIOLOGY REPORT*  Clinical Data: Nausea, vomiting, abdominal pain, fever  ACUTE ABDOMEN SERIES (ABDOMEN 2 VIEW & CHEST 1 VIEW)  Comparison: 03/01/2012   Findings: There is some linear scarring, infiltrate, or subsegmental atelectasis at the left lung base.   Lungs otherwise clear. No free air. No effusion.  Normal bowel gas pattern.  Right pelvic phlebolith.  Regional bones unremarkable.  Heart size normal. Vascular clips in the right mid abdomen.  IMPRESSION: 1.  Normal bowel gas pattern. 2.  No free air. 3.  Subsegmental atelectasis or infiltrate at the left lung base.   Original Report Authenticated By: Thora Lance III, M.D.     Scheduled Meds:    . amLODipine  5 mg Oral Daily   And  . irbesartan  150 mg Oral Daily  . feeding supplement  1 Container Oral TID BM  . magnesium sulfate 1 - 4 g bolus IVPB  2 g Intravenous Once  . pantoprazole (PROTONIX) IV  40 mg Intravenous Q24H  . potassium chloride  40 mEq Oral Q4H  . sodium bicarbonate  650 mg Oral TID  . sodium bicarbonate  650 mg Oral TID  . sodium chloride  3 mL Intravenous Q12H   Continuous Infusions:    . dextrose 5 % and 0.2 % NaCl with KCl 20 mEq 100 mL/hr at 03/04/12 0055    Time spent: >30 minutes   Keishon Chavarin  [facility] Pager [phone]. If 8PM-8AM, please contact night-coverage at [email or web address], password [facility] 03/05/2012, 12:26 PM  LOS: 4 days

## 2012-03-05 NOTE — Progress Notes (Signed)
  Subjective: No complaints. She says she is feeling better.   Objective: Vital signs in last 24 hours: Temp:  [98.4 F (36.9 C)-99.2 F (37.3 C)] 98.6 F (37 C) (10/27 0557) Pulse Rate:  [95-101] 95  (10/27 0557) Resp:  [16-20] 16  (10/27 0557) BP: (134-144)/(79-89) 144/89 mmHg (10/27 0557) SpO2:  [97 %-100 %] 100 % (10/27 0557) Last BM Date: 03/03/12  Intake/Output from previous day: 10/26 0701 - 10/27 0700 In: 1220 [P.O.:720; I.V.:500] Out: 1400 [Urine:1400] Intake/Output this shift:    GI: soft, nontender. good bs  Lab Results:  No results found for this basename: WBC:2,HGB:2,HCT:2,PLT:2 in the last 72 hours BMET  Basename 03/05/12 0452 03/04/12 0520  NA 143 140  K 3.0* 3.3*  CL 111 110  CO2 17* 18*  GLUCOSE 184* 121*  BUN 5* 3*  CREATININE 0.61 0.64  CALCIUM 8.5 8.4   PT/INR No results found for this basename: LABPROT:2,INR:2 in the last 72 hours ABG No results found for this basename: PHART:2,PCO2:2,PO2:2,HCO3:2 in the last 72 hours  Studies/Results: Nm Hepatobiliary Liver Func  03/04/2012  *RADIOLOGY REPORT*  Clinical Data:  61 year old female with continued abdominal pain following cholecystectomy.  Evaluate for bile leak.  NUCLEAR MEDICINE HEPATOBILIARY IMAGING  Technique:  Sequential images of the abdomen were obtained out to 60 minutes following intravenous administration of radiopharmaceutical.  Radiopharmaceutical:  5.20mCi Tc-51m Choletec  Comparison:  03/01/2012 CT  Findings: Prompt homogeneous hepatic activity is identified. Activity within the CBD is identified at 5 minutes. Activity within the small bowel is identified at 5-10 minutes. There is no evidence of bile leak. No abnormalities are identified.  IMPRESSION: No evidence of bile leak.  No significant abnormalities identified.   Original Report Authenticated By: Rosendo Gros, M.D.    Dg Abd Acute W/chest  03/04/2012  *RADIOLOGY REPORT*  Clinical Data: Nausea, vomiting, abdominal pain, fever   ACUTE ABDOMEN SERIES (ABDOMEN 2 VIEW & CHEST 1 VIEW)  Comparison: 03/01/2012  Findings: There is some linear scarring, infiltrate, or subsegmental atelectasis at the left lung base.   Lungs otherwise clear. No free air. No effusion.  Normal bowel gas pattern.  Right pelvic phlebolith.  Regional bones unremarkable.  Heart size normal. Vascular clips in the right mid abdomen.  IMPRESSION: 1.  Normal bowel gas pattern. 2.  No free air. 3.  Subsegmental atelectasis or infiltrate at the left lung base.   Original Report Authenticated By: Osa Craver, M.D.     Anti-infectives: Anti-infectives    None      Assessment/Plan: s/p * No surgery found * Workup has been neg for any complication of surgey. I can not explain what happened to her but she seems much better Would advance her diet  LOS: 4 days    TOTH III,Shandrea Lusk S 03/05/2012

## 2012-03-06 ENCOUNTER — Encounter (HOSPITAL_COMMUNITY): Payer: Self-pay | Admitting: *Deleted

## 2012-03-06 DIAGNOSIS — R7401 Elevation of levels of liver transaminase levels: Secondary | ICD-10-CM

## 2012-03-06 DIAGNOSIS — G9340 Encephalopathy, unspecified: Principal | ICD-10-CM

## 2012-03-06 DIAGNOSIS — F1010 Alcohol abuse, uncomplicated: Secondary | ICD-10-CM | POA: Diagnosis present

## 2012-03-06 DIAGNOSIS — F1021 Alcohol dependence, in remission: Secondary | ICD-10-CM | POA: Diagnosis present

## 2012-03-06 DIAGNOSIS — R4182 Altered mental status, unspecified: Secondary | ICD-10-CM

## 2012-03-06 LAB — BASIC METABOLIC PANEL
BUN: 3 mg/dL — ABNORMAL LOW (ref 6–23)
Creatinine, Ser: 0.55 mg/dL (ref 0.50–1.10)
GFR calc Af Amer: >90 mL/min (ref >90)
GFR calc non Af Amer: >90 mL/min (ref >90)
Potassium: 3.7 mEq/L (ref 3.5–5.1)

## 2012-03-06 LAB — HEPATIC FUNCTION PANEL
AST: 29 U/L (ref 0–37)
Bilirubin, Direct: 0.2 mg/dL (ref 0.0–0.3)
Indirect Bilirubin: 0.4 mg/dL (ref 0.3–0.9)

## 2012-03-06 MED ORDER — THIAMINE HCL 100 MG/ML IJ SOLN: 500.0000 mg | Freq: Three times a day (TID) | INTRAMUSCULAR | Status: DC

## 2012-03-06 MED ORDER — SODIUM CHLORIDE 0.9 % IV SOLN
Freq: Three times a day (TID) | INTRAVENOUS | Status: AC
  Administered 2012-03-06 – 2012-03-08 (×6): via INTRAVENOUS
  Filled 2012-03-06 (×7): qty 50

## 2012-03-06 MED ORDER — ZOLPIDEM TARTRATE 5 MG PO TABS
2.5000 mg | ORAL_TABLET | Freq: Every evening | ORAL | Status: DC | PRN
  Administered 2012-03-06: 2.5 mg via ORAL
  Filled 2012-03-06: qty 1

## 2012-03-06 MED ORDER — MAGNESIUM SULFATE 40 MG/ML IJ SOLN
2.0000 g | Freq: Two times a day (BID) | INTRAMUSCULAR | Status: DC
  Administered 2012-03-06: 2 g via INTRAVENOUS
  Filled 2012-03-06: qty 50

## 2012-03-06 MED ORDER — FOLIC ACID 5 MG/ML IJ SOLN
1.0000 mg | Freq: Every day | INTRAMUSCULAR | Status: DC
  Administered 2012-03-06 – 2012-03-07 (×2): 1 mg via INTRAVENOUS
  Filled 2012-03-06 (×2): qty 0.2

## 2012-03-06 MED ORDER — THIAMINE HCL 100 MG/ML IJ SOLN
INTRAVENOUS | Status: AC
  Administered 2012-03-09 – 2012-03-13 (×5): via INTRAVENOUS
  Filled 2012-03-06 (×5): qty 50

## 2012-03-06 MED ORDER — MAGNESIUM SULFATE 40 MG/ML IJ SOLN
2.0000 g | Freq: Two times a day (BID) | INTRAMUSCULAR | Status: DC
  Filled 2012-03-06 (×2): qty 50

## 2012-03-06 MED ORDER — MAGNESIUM SULFATE 40 MG/ML IJ SOLN
2.0000 g | Freq: Two times a day (BID) | INTRAMUSCULAR | Status: DC
  Filled 2012-03-06: qty 50

## 2012-03-06 MED ORDER — MAGNESIUM SULFATE 40 MG/ML IJ SOLN
2.0000 g | Freq: Two times a day (BID) | INTRAMUSCULAR | Status: AC
  Administered 2012-03-06: 2 g via INTRAVENOUS
  Filled 2012-03-06: qty 50

## 2012-03-06 MED ORDER — THIAMINE HCL 100 MG/ML IJ SOLN: 500.0000 mg | Freq: Every day | INTRAMUSCULAR | Status: DC

## 2012-03-06 NOTE — Progress Notes (Addendum)
Patient was more lucid during 1st hour of shift, and then she became increasingly confused and non-compliant as the evening wore on. She refused 1/2 of her K+   She continues to not be getting much sleep. She is too weak to stand on her own. When we removed her from her chair to her bed, she could not stand to her feet even with 2 assisting her. We finally used the [place] lift to get her back to bed. There is a possibility that she could benefit from a sleep aid as she has not been able to get any sleep for the past 72 hours.

## 2012-03-06 NOTE — Progress Notes (Signed)
Subjective: She seems to be feeling better, no issues with abdominal discomfort.  Objective: Vital signs in last 24 hours: Temp:  [98.6 F (37 C)-99 F (37.2 C)] 98.7 F (37.1 C) (10/28 0641) Pulse Rate:  [99-104] 104  (10/28 0641) Resp:  [16-18] 16  (10/28 0641) BP: (130-137)/(77-82) 137/82 mmHg (10/28 0641) SpO2:  [100 %] 100 % (10/28 0641) Last BM Date: 03/03/12  PO not recorded yesterday, Diet:Dysphagia III, TM 99, VSS, Mag 1.0, LFT's improving  Intake/Output from previous day: 10/27 0701 - 10/28 0700 In: 2200 [I.V.:2200] Out: 1275 [Urine:1275] Intake/Output this shift:    General appearance: alert, cooperative and no distress GI: soft, non-tender; bowel sounds normal; no masses,  no organomegaly, Incisions all look good.  Lab Results:  No results found for this basename: WBC:2,HGB:2,HCT:2,PLT:2 in the last 72 hours  BMET  [facility] 03/06/12 0440 03/05/12 0452  NA 141 143  K 3.7 3.0*  CL 110 111  CO2 18* 17*  GLUCOSE 114* 184*  BUN 3* 5*  CREATININE 0.55 0.61  CALCIUM 9.0 8.5   PT/INR No results found for this basename: LABPROT:2,INR:2 in the last 72 hours   Lab 03/06/12 0440 03/02/12 0446 03/01/12 1239  AST 29 63* 119*  ALT 48* 92* 140*  ALKPHOS 169* 163* 203*  BILITOT 0.6 0.5 0.8  PROT 6.7 5.6* 7.2  ALBUMIN 3.3* 2.7* 3.5     Lipase     Component Value Date/Time   LIPASE 235* 03/01/2012 1239     Studies/Results: Nm Hepatobiliary Liver Func  03/04/2012  *RADIOLOGY REPORT*  Clinical Data:  61 year old female with continued abdominal pain following cholecystectomy.  Evaluate for bile leak.  NUCLEAR MEDICINE HEPATOBILIARY IMAGING  Technique:  Sequential images of the abdomen were obtained out to 60 minutes following intravenous administration of radiopharmaceutical.  Radiopharmaceutical:  5.16mCi Tc-77m Choletec  Comparison:  03/01/2012 CT  Findings: Prompt homogeneous hepatic activity is identified. Activity within the CBD is identified at 5  minutes. Activity within the small bowel is identified at 5-10 minutes. There is no evidence of bile leak. No abnormalities are identified.  IMPRESSION: No evidence of bile leak.  No significant abnormalities identified.   Original Report Authenticated By: Rosendo Gros, M.D.    Dg Abd Acute W/chest  03/04/2012  *RADIOLOGY REPORT*  Clinical Data: Nausea, vomiting, abdominal pain, fever  ACUTE ABDOMEN SERIES (ABDOMEN 2 VIEW & CHEST 1 VIEW)  Comparison: 03/01/2012  Findings: There is some linear scarring, infiltrate, or subsegmental atelectasis at the left lung base.   Lungs otherwise clear. No free air. No effusion.  Normal bowel gas pattern.  Right pelvic phlebolith.  Regional bones unremarkable.  Heart size normal. Vascular clips in the right mid abdomen.  IMPRESSION: 1.  Normal bowel gas pattern. 2.  No free air. 3.  Subsegmental atelectasis or infiltrate at the left lung base.   Original Report Authenticated By: Osa Craver, M.D.     Medications:    . amLODipine  5 mg Oral Daily   And  . irbesartan  150 mg Oral Daily  . feeding supplement  1 Container Oral TID BM  . influenza  inactive virus vaccine  0.5 mL Intramuscular Once  . magnesium sulfate 1 - 4 g bolus IVPB  2 g Intravenous Q12H  . pantoprazole (PROTONIX) IV  40 mg Intravenous Q24H  . potassium chloride  40 mEq Oral Q4H  . sodium bicarbonate  650 mg Oral TID  . sodium bicarbonate  650 mg Oral TID  .  sodium chloride  3 mL Intravenous Q12H  . DISCONTD: magnesium sulfate 1 - 4 g bolus IVPB  2 g Intravenous Once    Assessment/Plan S/p lap cholecystectomy 02/26/12 Dr. Biagio Quint with abnormal LFT's post op Readmitted on 10/23 with confusion/encephalopathy. Electrolyte abnormalities  Opthalmic tumor behind left eye. MRI suggest lymphoma vs meningioma.  To be followed up in [place], [place] hyperthyroid   Plan:  This does not appear related to her surgery.  Will be available as needed.  She can follow up with Dr. Biagio Quint in 2-3 weeks.   I will put info in AVS.    LOS: 5 days    JENNINGS,WILLARD 03/06/2012  Though she had the anorexia, nausea and abdominal pain and cholelithiasis which led to the cholecystectomy, everything looked normal intraoperative and cholangiogram normal.  Given her anorexia and nausea and weight loss with her history of bleeding ulcer in 2/13, I have recommended EGD to r/o gastric cause.  This may be related to her eye as well.  I do not think that this is related to her GB or to postop complication.

## 2012-03-06 NOTE — Progress Notes (Signed)
[facility] PROGRESS NOTE  Abigail Powell ZOX:[id number] DOB: 06-07-1950 DOA: 03/01/2012 PCP: Sheila Oats, MD  Assessment/Plan: 1-Metabolic encephalopathy: at this point without clear etiology. Suspect is related to abnormal electrolytes, alcohol abuse, starvation and acidosis. TSH low and normal free T4; demonstrating subclinical hyperthyroidism (might be also playing a role), do not require treatment at this moment. Will continue IVF's resuscitation and repletion of electrolytes. Supportive care and empirically treatment for wernicke syndrome from alcohol.  2-Transaminitis: Secondary to recent surgical manipulation most likely; no signs of infection or other abnormalities on CT and [place]. Surgery curbside and has recommended hida scan, which is demonstrating no bile leak.   3-Physical deconditioning: multifactorial and most likely associated with decrease PO intake and recent surgery. Patient encourage to increase PO intake; per PT will benefit from 24/7 supervision and care at home vs SNF. Will follow patient clinical evolution.  4-Left eye tumor: MRI suggesting lymphoma vs meningioma or any other mass due to image finding; patient to be seen by eye specialist in [facility].  5-HTN: stable. Continue current regimen.  6-Diarrhea: no further episodes now. negative C. Diff test; most likely gastroenteritis and/or associated with cholecystectomy. Will start cholestyramine if diarrhea continues. Per surgery rec's hida scan checked and no bile leak or abnormalities.   7-hypokalemia: secondary to diarrhea, decrease PO intake and alcohol. Will monitor electrolytes. Potassium is 3.7 today.   8-metabolic acidosis: secondary to diarrhea, alcohol abuse and starvation. Improving. Continue bicarb; continue IVF's. Advance diet  9-Nausea and vomiting: symptoms associated with use of probiotics and recent cholecystectomy vs dyspepsia; also hx of alcohol contributing. C. Diff by PCR negative; improved after  florastor disocontinue. Will continue using PRN compazine, continue protonix and treat empirically with thiamine. Will ask speech therapy to see her for difficulty swallowing and if any abnormalities seen, then she might need EGD (not indicated at this point), appreciate GI assistance.  10-subclinical hyperthyroidism: will no treat at this point; patient will need repeat labs in 6-8 weeks and if still abnormal then will require further treatment.  11-Hypomagenesimia: will continue repletion; Mg in am -secondary to episodes of diarrhea, decrease PO intake and alcohol abuse  12-Alcohol abuse: with concerns for wernicke syndrome. -Will start tx with thiamine -start folic acid -CIWA assessment -Will check folic acid level   13-dysphagia: -will ask SPT to evaluate and provide recommendations.   DVT: SCD's   Code Status: Full Family Communication: daughter and son at bedside Disposition Plan: SNF at discharge. Still no medically stable.    Consultants:  CCS  GI  Procedures:  MRI orbit (positive mass with spreading behavior into optic nerve)  Hida scan (no bile leak)  Antibiotics:  None  HPI/Subjective: afebrile, no CP, no SOB. Patient still confused; no further nausea or vomiting. Per nurse patient with some delay swallowing and increase spitting. Also daughter provide new information of significant alcohol abuse on her mother (info never reported on admission).  Objective: Filed Vitals:   03/05/12 0557 03/05/12 1500 03/05/12 2230 03/06/12 0641  BP: 144/89 133/77 130/79 137/82  Pulse: 95 99 102 104  Temp: 98.6 F (37 C) 98.6 F (37 C) 99 F (37.2 C) 98.7 F (37.1 C)  TempSrc: Oral Oral Oral Oral  Resp: 16 18 16 16   Height:      Weight:      SpO2: 100% 100% 100% 100%    Intake/Output Summary (Last 24 hours) at 03/06/12 1156 Last data filed at 03/06/12 0500  Gross per 24 hour  Intake  2200 ml  Output   1275 ml  Net    925 ml   Filed Weights   03/01/12 2333   Weight: 53.4 kg (117 lb 11.6 oz)    Exam:   General:  NAD, afebrile; AAOX1; still confused  Cardiovascular: mild tachycardia, no rubs or gallops; no murmur  Respiratory: CTA bilaterally  Abdomen: soft, mild discomfort on deep palpation on her RUQ, ND, positive BS  Extremities: no edema, no erythema, no cyanosis  Neuro: AAOX1; CN intact (except for Left eyes with exophthalmus and abnormal EO movements; no focal deficit appreciated. Patient also reports some neuropathy on her hands  Data Reviewed: Basic Metabolic Panel:  Lab 03/06/12 1610 03/05/12 0452 03/04/12 0520 03/03/12 0441 03/02/12 0446  NA 141 143 140 141 139  K 3.7 3.0* 3.3* 3.3* 3.0*  CL 110 111 110 113* 109  CO2 18* 17* 18* 16* 16*  GLUCOSE 114* 184* 121* 129* 108*  BUN 3* 5* 3* 4* 8  CREATININE 0.55 0.61 0.64 0.65 0.67  CALCIUM 9.0 8.5 8.4 8.2* 8.2*  MG 1.0* -- 1.1* -- --  PHOS -- -- 2.5 -- --   Liver Function Tests:  Lab 03/06/12 0440 03/02/12 0446 03/01/12 1239  AST 29 63* 119*  ALT 48* 92* 140*  ALKPHOS 169* 163* 203*  BILITOT 0.6 0.5 0.8  PROT 6.7 5.6* 7.2  ALBUMIN 3.3* 2.7* 3.5    Lab 03/01/12 1239  LIPASE 235*  AMYLASE --    Lab 03/02/12 0446 03/01/12 2105  AMMONIA 47 27   CBC:  Lab 03/02/12 0446 03/01/12 2105 03/01/12 1239  WBC 8.5 -- 5.7  NEUTROABS -- -- 4.8  HGB 10.0* 11.8* 14.8  HCT 29.2* 34.4* 44.2  MCV 88.0 -- 88.9  PLT 365 -- 270   Cardiac Enzymes:  Lab 03/01/12 1239  CKTOTAL --  CKMB --  CKMBINDEX --  TROPONINI <0.30   CBG:  Lab 03/01/12 1910  GLUCAP 72    Recent Results (from the past 240 hour(s))  MRSA PCR SCREENING     Status: Normal   Collection Time   02/26/12 11:35 AM      Component Value Range Status Comment   MRSA by PCR NEGATIVE  NEGATIVE Final   CULTURE, BLOOD (ROUTINE X 2)     Status: Normal (Preliminary result)   Collection Time   03/01/12  9:05 PM      Component Value Range Status Comment   Specimen Description BLOOD RIGHT HAND   Final     Special Requests BOTTLES DRAWN AEROBIC ONLY   Final    Culture  Setup Time 03/02/2012 02:03   Final    Culture     Final    Value:        BLOOD CULTURE RECEIVED NO GROWTH TO DATE CULTURE WILL BE HELD FOR 5 DAYS BEFORE ISSUING A FINAL NEGATIVE REPORT   Report Status PENDING   Incomplete   CULTURE, BLOOD (ROUTINE X 2)     Status: Normal (Preliminary result)   Collection Time   03/01/12  9:10 PM      Component Value Range Status Comment   Specimen Description BLOOD RIGHT ARM   Final    Special Requests BOTTLES DRAWN AEROBIC ONLY   Final    Culture  Setup Time 03/02/2012 02:03   Final    Culture     Final    Value:        BLOOD CULTURE RECEIVED NO GROWTH TO DATE CULTURE WILL  BE HELD FOR 5 DAYS BEFORE ISSUING A FINAL NEGATIVE REPORT   Report Status PENDING   Incomplete   CLOSTRIDIUM DIFFICILE BY PCR     Status: Normal   Collection Time   03/02/12  7:35 PM      Component Value Range Status Comment   C difficile by pcr NEGATIVE  NEGATIVE Final   URINE CULTURE     Status: Normal   Collection Time   03/03/12  1:34 PM      Component Value Range Status Comment   Specimen Description URINE, CATHETERIZED   Final    Special Requests NONE   Final    Culture  Setup Time 03/04/2012 01:12   Final    Colony Count NO GROWTH   Final    Culture NO GROWTH   Final    Report Status 03/04/2012 FINAL   Final      Studies: Nm Hepatobiliary Liver Func  03/04/2012  *RADIOLOGY REPORT*  Clinical Data:  61 year old female with continued abdominal pain following cholecystectomy.  Evaluate for bile leak.  NUCLEAR MEDICINE HEPATOBILIARY IMAGING  Technique:  Sequential images of the abdomen were obtained out to 60 minutes following intravenous administration of radiopharmaceutical.  Radiopharmaceutical:  5.8mCi Tc-65m Choletec  Comparison:  03/01/2012 CT  Findings: Prompt homogeneous hepatic activity is identified. Activity within the CBD is identified at 5 minutes. Activity within the small bowel is  identified at 5-10 minutes. There is no evidence of bile leak. No abnormalities are identified.  IMPRESSION: No evidence of bile leak.  No significant abnormalities identified.   Original Report Authenticated By: Rosendo Gros, M.D.    Dg Abd Acute W/chest  03/04/2012  *RADIOLOGY REPORT*  Clinical Data: Nausea, vomiting, abdominal pain, fever  ACUTE ABDOMEN SERIES (ABDOMEN 2 VIEW & CHEST 1 VIEW)  Comparison: 03/01/2012  Findings: There is some linear scarring, infiltrate, or subsegmental atelectasis at the left lung base.   Lungs otherwise clear. No free air. No effusion.  Normal bowel gas pattern.  Right pelvic phlebolith.  Regional bones unremarkable.  Heart size normal. Vascular clips in the right mid abdomen.  IMPRESSION: 1.  Normal bowel gas pattern. 2.  No free air. 3.  Subsegmental atelectasis or infiltrate at the left lung base.   Original Report Authenticated By: Thora Lance III, M.D.     Scheduled Meds:    . amLODipine  5 mg Oral Daily   And  . irbesartan  150 mg Oral Daily  . feeding supplement  1 Container Oral TID BM  . folic acid  1 mg Intravenous Daily  . influenza  inactive virus vaccine  0.5 mL Intramuscular Once  . magnesium sulfate 1 - 4 g bolus IVPB  2 g Intravenous Q12H  . pantoprazole (PROTONIX) IV  40 mg Intravenous Q24H  . potassium chloride  40 mEq Oral Q4H  . sodium bicarbonate  650 mg Oral TID  . sodium chloride 0.9 % 50 mL with thiamine (B-1) 500 mg infusion   Intravenous Q8H  . sodium chloride 0.9 % 50 mL with thiamine (B-1) 500 mg infusion   Intravenous Q24H  . sodium chloride  3 mL Intravenous Q12H  . DISCONTD: magnesium sulfate 1 - 4 g bolus IVPB  2 g Intravenous Once  . DISCONTD: magnesium sulfate 1 - 4 g bolus IVPB  2 g Intravenous Q12H  . DISCONTD: thiamine  500 mg Intravenous TID  . DISCONTD: thiamine  500 mg Intravenous Daily   Continuous Infusions:    .  dextrose 5 % and 0.2 % NaCl with KCl 20 mEq 100 mL/hr at 03/06/12 0500    Time spent:  >30 minutes   Vanna Sailer  [facility] Pager [phone]. If 8PM-8AM, please contact night-coverage at [email or web address], password [facility] 03/06/2012, 11:56 AM  LOS: 5 days

## 2012-03-06 NOTE — Evaluation (Signed)
Clinical/Bedside Swallow Evaluation Patient Details  Name: Abigail Powell MRN: [id number] Date of Birth: 1950/08/31  Today's Date: 03/06/2012 Time: 1230-1319 SLP Time Calculation (min): 49 min  Past Medical History:  Past Medical History  Diagnosis Date  . Diverticulosis   . Hypertension   . 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 H/O gastric ulcer    Past Surgical History:  Past Surgical History  Procedure Date  . Cholecystectomy 02/26/2012    Procedure: LAPAROSCOPIC CHOLECYSTECTOMY WITH INTRAOPERATIVE CHOLANGIOGRAM;  Surgeon: Lodema Pilot, DO;  Location: [facility]S;  Service: General;  Laterality: N/A;  . Abdominal hysterectomy   . Orthoscopic knee surgery   . Abdominal hysterectomy    HPI:  61 yo female adm to [facility] 03/01/12 with abdominal pain, pt with metabolic encephalopathy, being tx'd for wernicke syndrome from alcohol.  Pt with poor intake and son Dwayne reports she was fully independent prior to gall bladder removal even driving herself.  Pt moved from [place] to [place] recently, has lost 20 pounds in 5-6 weeks and has been unable to care for herself recently d/t medical issues.  CXR indicated subsegmental ATX or infiltrate left base 10/26.  Per RN report to MD, pt is spitting food/drink out and has a delay in swallow.  Son reports intake was better yesterday than today (pot roast) but confirmed pt trying to masticate and expectorating foods.  Dwayne reports pt with better tolerance of liquids than solids.     Assessment / Plan / Recommendation Clinical Impression  Pt presents with appearance of primarily cognitive-based dysphagia resulting in delayed swallow transiting and initiation exacerbated by inability to wear upper denture.  Pt did orally hold and expectorate boost breeze x1 but all other boluses were swallowed.  Laryngeal elevation observed at bedside appeared adequate and pt did not have multiple swallows to indicate stasis.  She did cough a few times with  po intake- ? premature loss of liquid into pharynx and/or esoph source of cough.  Son denies pt coughing with intake with him.  Pt accepted only a few small (1/4 tsp) bites/sips before she [place] states she doesn't want any more.  When inquired as to why she is not eating, pt unable to state source.  She denies reflux symptoms, nor dis-pleasure in food sent.   Expectoration of clear secretions occured prior to po intake only - not during nor after.  - ? source.    SLP to follow up briefly to aid in maximizing pt's nutrition with airway protection.  Educated pt and son Dwayne to swallow precautions and son verbalized understanding to information provided.  As son reports liquids were easier for pt to manage, rec maximize liquid supplement intake.      Aspiration Risk  Moderate    Diet Recommendation Dysphagia 3 (Mechanical Soft);Thin liquid (ground meats)   Liquid Administration via: Straw Medication Administration: Crushed with puree Supervision: Full supervision/cueing for compensatory strategies;Trained caregiver to feed patient Compensations: Slow rate;Small sips/bites;Check for pocketing Postural Changes and/or Swallow Maneuvers: Seated upright 90 degrees;Upright 30-60 min after meal    Other  Recommendations     Follow Up Recommendations       Frequency and Duration min 2x/week  2 weeks   Pertinent Vitals/Pain Low grade temp, decreased, poor intake 15%    SLP Swallow Goals Patient will utilize recommended strategies during swallow to increase swallowing safety with: Total assistance   Swallow Study Prior Functional Status  General Date of Onset: 03/06/12 HPI: 61 yo female adm to [facility] 03/01/12 with abdominal pain, pt with metabolic encephalopathy, being tx'd for wernicke syndrome from alcohol.  Pt with poor intake and son Dwayne reports she was fully independent prior to gall bladder removal even driving herself.  Pt moved from [place] to [place] recently, has lost 20 pounds in  5-6 weeks and has been unable to care for herself recently d/t medical issues.  CXR indicated subsegmental ATX or infiltrate left base 10/26.  Per RN report to MD, pt is spitting food/drink out and has a delay in swallow.  Son reports intake was better yesterday than today (pot roast) but confirmed pt trying to masticate and expectorating foods.  Dwayne reports pt with better tolerance of liquids than solids.   Type of Study: Bedside swallow evaluation Previous Swallow Assessment: none Diet Prior to this Study: Dysphagia 3 (soft);Thin liquids Temperature Spikes Noted: Yes (low grade) History of Recent Intubation: No Behavior/Cognition: Alert;Agitated;Impulsive;Distractible;Doesn't follow directions Oral Cavity - Dentition: Dentures, top;Dentures, bottom (top dentures unable to place due to gagging) Self-Feeding Abilities: Total assist Patient Positioning: Upright in chair Baseline Vocal Quality: Low vocal intensity;Hoarse Volitional Cough: Weak Volitional Swallow: Able to elicit    Oral/Motor/Sensory Function Overall Oral Motor/Sensory Function: Appears within functional limits for tasks assessed   Ice Chips Ice chips: Not tested   Thin Liquid Thin Liquid: Impaired Presentation: Straw Oral Phase Impairments: Impaired anterior to posterior transit Oral Phase Functional Implications: Prolonged oral transit Pharyngeal  Phase Impairments: Suspected delayed Swallow;Cough - Delayed Other Comments: weak cough x1, ? premature spillage of bolus into pharynx, airway    Nectar Thick Nectar Thick Liquid: Not tested   Honey Thick Honey Thick Liquid: Not tested   Puree Puree: Impaired Presentation: Spoon Oral Phase Functional Implications: Prolonged oral transit Pharyngeal Phase Impairments: Suspected delayed Swallow Other Comments: pt accepting only 1/4 tsp at a time, delayed swallow response - accepted two boluses of applesauce and single bite of sherbert   Solid   GO    Solid: Impaired Oral  Phase Impairments: Impaired anterior to posterior transit;Reduced lingual movement/coordination Oral Phase Functional Implications: Oral holding Pharyngeal Phase Impairments: Suspected delayed Swallow Other Comments: softened cracker- verbal cue helpful for pt to trigger swallow       Donavan Burnet, MS [facility] SLP [phone] 03/06/2012,2:01 PM

## 2012-03-06 NOTE — Progress Notes (Signed)
Physical Therapy Treatment Patient Details Name: Abigail Powell MRN: [id number] DOB: Mar 10, 1951 Today's Date: 03/06/2012 Time: 4098-1191 PT Time Calculation (min): 27 min  PT Assessment / Plan / Recommendation Comments on Treatment Session  Family in room.  Pt from [place].  Aware she was in the hosital and aware she has surgery.  Followed commands and demon appropriate behavior cause she laughed at my jokes. Very limity mobility demon ataxic gross motor control difficulties, unable to grasp (RW/tissue). Attempted to amb however B knees buckled and pt unable to right self to center.  "Bear Hug" squat pivot to chair with static standing approx 60 sec, pt very fearful, anxious repeating "I got to sit down".  Family states this is new.    Follow Up Recommendations   (skilled nursing)     Does the patient have the potential to tolerate intense rehabilitation     Barriers to Discharge        Equipment Recommendations       Recommendations for Other Services    Frequency Min 3X/week   Plan Discharge plan remains appropriate    Precautions / Restrictions Precautions Precautions: Fall Restrictions Weight Bearing Restrictions: No    Pertinent Vitals/Pain No c/o pain    Mobility  Bed Mobility Bed Mobility: Supine to Sit Supine to Sit: 1: +1 Total assist Supine to Sit: Patient Percentage: 40% Details for Bed Mobility Assistance: very difficult to maneuver self with gross motor ataxic mvts and trmors throughout. Total assist + 1 with increased time required max assist just to maintain sitting EOB as pt was unable to right self to midline.  Transfers Transfers: Sit to Stand;Stand to Sit;Stand Pivot Transfers Sit to Stand: 1: +2 Total assist;From bed Sit to Stand: Patient Percentage: 10% Stand Pivot Transfers: 1: +1 Total assist Stand Pivot Transfers: Patient Percentage: 10% Details for Transfer Assistance: attempted sit to stand + 2 assist however B knees buckled and pt nearly went down to  the floor. 2nd attempt was squat pivot transfer + 1 assist "bear hug" tech while blocking B knees.  Pt able to bear her own weight hoever was unable to weight shift or advance eith LE with B knees buckling.  Very ataxic, gross motor mvts with inablility to grasp.   Ambulation/Gait Ambulation/Gait Assistance Details: pt non amb at this time.     PT Goals progressing slowly    Visit Information  Last PT Received On: 03/06/12 Assistance Needed: +2    Subjective Data      Cognition    AxOx2   Balance   zero  End of Session PT - End of Session Equipment Utilized During Treatment: Gait belt Activity Tolerance: Treatment limited secondary to medical complications (Comment) Patient left: in chair;with call bell/phone within reach;with family/visitor present Nurse Communication: Mobility status   Felecia Shelling  PTA [facility]  Acute  Rehab Pager     [phone]

## 2012-03-06 NOTE — Progress Notes (Addendum)
[facility] Progress Note  Subjective:  Patient is very confused.  Daughter is upset because she says that her mother was completely healthy and functional before this admission.  Patient denies nausea, vomiting, and abdominal pain.  Per nursing, she is just "spitting up" at times.  Objective:  Vital signs in last 24 hours: Temp:  [98.6 F (37 C)-99 F (37.2 C)] 98.7 F (37.1 C) (10/28 0641) Pulse Rate:  [99-104] 104  (10/28 0641) Resp:  [16-18] 16  (10/28 0641) BP: (130-137)/(77-82) 137/82 mmHg (10/28 0641) SpO2:  [100 %] 100 % (10/28 0641) Last BM Date: 03/03/12  General:   Alert, Well-developed, in NAD; confused. Heart:  Tachy, but with regular rhythm; no murmurs Pulm:  CTAB.  No W/R/R. Abdomen:  Soft, nontender and nondistended. Normal bowel sounds, without guarding, and without rebound.   Extremities:  Without edema. Neurologic:  Alert, oriented to person only.  Intake/Output from previous day: 10/27 0701 - 10/28 0700 In: 2200 [I.V.:2200] Out: 1275 [Urine:1275]  BMET  Basename 03/06/12 0440 03/05/12 0452 03/04/12 0520  NA 141 143 140  K 3.7 3.0* 3.3*  CL 110 111 110  CO2 18* 17* 18*  GLUCOSE 114* 184* 121*  BUN 3* 5* 3*  CREATININE 0.55 0.61 0.64  CALCIUM 9.0 8.5 8.4   LFT  Basename 03/06/12 0440  PROT 6.7  ALBUMIN 3.3*  AST 29  ALT 48*  ALKPHOS 169*  BILITOT 0.6  BILIDIR 0.2  IBILI 0.4   Nm Hepatobiliary Liver Func  03/04/2012  *RADIOLOGY REPORT*  Clinical Data:  61 year old female with continued abdominal pain following cholecystectomy.  Evaluate for bile leak.  NUCLEAR MEDICINE HEPATOBILIARY IMAGING  Technique:  Sequential images of the abdomen were obtained out to 60 minutes following intravenous administration of radiopharmaceutical.  Radiopharmaceutical:  5.2mCi Tc-2m Choletec  Comparison:  03/01/2012 CT  Findings: Prompt homogeneous hepatic activity is identified. Activity within the CBD is identified at 5 minutes. Activity within the  small bowel is identified at 5-10 minutes. There is no evidence of bile leak. No abnormalities are identified.  IMPRESSION: No evidence of bile leak.  No significant abnormalities identified.   Original Report Authenticated By: Rosendo Gros, M.D.    Dg Abd Acute W/chest  03/04/2012  *RADIOLOGY REPORT*  Clinical Data: Nausea, vomiting, abdominal pain, fever  ACUTE ABDOMEN SERIES (ABDOMEN 2 VIEW & CHEST 1 VIEW)  Comparison: 03/01/2012  Findings: There is some linear scarring, infiltrate, or subsegmental atelectasis at the left lung base.   Lungs otherwise clear. No free air. No effusion.  Normal bowel gas pattern.  Right pelvic phlebolith.  Regional bones unremarkable.  Heart size normal. Vascular clips in the right mid abdomen.  IMPRESSION: 1.  Normal bowel gas pattern. 2.  No free air. 3.  Subsegmental atelectasis or infiltrate at the left lung base.   Original Report Authenticated By: Osa Craver, M.D.     Assessment / Plan: 1) Abnormal LFT's s/p laparoscopic cholecystectomy for cholelithiasis (negative IOC).  LFT's are continuing to slowly improve since cholecystectomy. 2) Nausea/vomiting/abdominal pain:  Seems to have resolved.  3) ? Metabolic encephalopathy.  4) HTN/Hyperlipidemia.  5) Colonic diverticulosis. 6)ETOH abuse:  Indicated by her daughter.  But she does not have cirrhosis on any imaging studies and pattern is not consistent with ETOH liver disease.  -Would recheck LFT's again in a week to be sure they have continued to trend down and return to normal.   LOS: 5 days   ZEHR, JESSICA D.  03/06/2012, 8:41 AM  Pager number [phone]    I have taken an interval history, reviewed the chart and examined the patient. I agree with Otho Darner note, impression and recommendations. No active GI problems. N/V have resolved. Encephalopathy of unclear etiology. Given new uncovered etoh history would consider Wernicke's encephalopathy and give Thiamine IV. Recommend neurology  consult.  Venita Lick. Russella Dar MD Clementeen Graham

## 2012-03-07 ENCOUNTER — Inpatient Hospital Stay (HOSPITAL_COMMUNITY): Payer: BC Managed Care – HMO

## 2012-03-07 DIAGNOSIS — G8250 Quadriplegia, unspecified: Secondary | ICD-10-CM

## 2012-03-07 LAB — BASIC METABOLIC PANEL
BUN: 4 mg/dL — ABNORMAL LOW (ref 6–23)
CO2: 23 mEq/L (ref 19–32)
Calcium: 9 mg/dL (ref 8.4–10.5)
Glucose, Bld: 112 mg/dL — ABNORMAL HIGH (ref 70–99)
Potassium: 4 mEq/L (ref 3.5–5.1)
Sodium: 137 mEq/L (ref 135–145)

## 2012-03-07 MED ORDER — LORAZEPAM 0.5 MG PO TABS
0.5000 mg | ORAL_TABLET | Freq: Once | ORAL | Status: AC | PRN
  Administered 2012-03-07: 0.5 mg via ORAL
  Filled 2012-03-07: qty 1

## 2012-03-07 NOTE — Progress Notes (Signed)
[facility] PROGRESS NOTE  Rogelio Winbush JXB:[id number] DOB: February 22, 1951 DOA: 03/01/2012 PCP: Sheila Oats, MD  Assessment/Plan: 1-Metabolic encephalopathy: at this point without clear etiology. Suspect is related to abnormal electrolytes, alcohol abuse, starvation and acidosis. TSH low and normal free T4; demonstrating subclinical hyperthyroidism (might be also playing a role), do not require treatment at this moment. Electrolytes are now WNL; will change IVF's to maintenance, encourage PO intake and monitor labs. Will continue supportive care and empirically treatment for wernicke syndrome from alcohol with thiamine. Will also check brain MRI and cervical MRI; neurology has been consulted.  2-Transaminitis: Secondary to recent surgical manipulation most likely; no signs of infection or other abnormalities on CT and [place]. Surgery curbside and has recommended hida scan, which is demonstrating no bile leak. LFT's almost back to normal now.  3-Physical deconditioning: multifactorial and most likely associated with decrease PO intake and recent surgery. Patient encourage to increase PO intake; per PT will benefit from SNF, family in agreement and SW is helping with discharge.  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  5-HTN: stable. Continue current regimen.  6-Diarrhea: no further episodes now. negative C. Diff test; most likely gastroenteritis and/or associated with cholecystectomy. Will start cholestyramine if diarrhea continues. Per surgery rec's hida scan checked and no bile leak or abnormalities. GI since symptoms has resolved is not planning for any procedure.  7-hypokalemia: secondary to diarrhea, decrease PO intake and alcohol. Will monitor electrolytes. Potassium is 4.0 today.   8-metabolic acidosis: secondary to diarrhea, alcohol abuse and starvation. Resolved. Will  monitor.  9-Nausea and vomiting: symptoms associated with use of probiotics and recent cholecystectomy vs dyspepsia; also hx of alcohol contributing. C. Diff by PCR negative; improved after florastor disocontinue. Will continue using PRN compazine, continue protonix and treat empirically with thiamine; appreciate GI assistance.  10-subclinical hyperthyroidism: will no treat at this point; patient will need repeat labs in 6-8 weeks and if still abnormal then will require further evaluation and decide for treatment. (repeat TSH, free T4 and T3; also thyroid US; can be done as an outpatient). She is not even tolerating PO meds properly at this moment.  11-Hypomagenesimia: will continue repletion; Mg in am -secondary to episodes of diarrhea, decrease PO intake and alcohol abuse -Mg is now 2.3  12-Alcohol abuse: with concerns for wernicke syndrome. -Will continue thiamine tx -CIWA assessment -folic acid level 145  13-Dysphagia: -Per SPT will follow dys 3 diet and thing liquids (precautions for aspiration) -Delayed swallowing.  14-Upper extremities weakness, decrease coordination, ataxia and numbness: even everything might be related to alcohol and wernicke; will perform MRI of brain and cervical spine to r/o any other abnormalities. Neurology has also been consulted for further recommendations.   DVT: SCD's   Code Status: Full Family Communication: daughter and son at bedside Disposition Plan: SNF at discharge. Still no medically stable.    Consultants:  [facility]  GI  Neurology  Procedures:  MRI orbit (positive mass with spreading behavior into optic nerve)  Hida scan (no bile leak)  Antibiotics:  None  HPI/Subjective: afebrile, no CP, no SOB. Patient still confused; no further nausea or vomiting or diarrhea. Electrolytes back to WNL now. Patient complaining of upper extremities weakness and numbness.  Objective: Filed Vitals:   03/06/12 1515 03/06/12 2020 03/07/12 0508  03/07/12 1509  BP: 150/80 137/80 149/89 137/84  Pulse: 114 102 96 103  Temp: 98.2  F (36.8 C) 98.4 F (36.9 C) 98.4 F (36.9 C) 98.5 F (36.9 C)  TempSrc: Oral Oral Oral Oral  Resp: 18 20 18 20   Height:      Weight:      SpO2: 99% 100% 100% 100%    Intake/Output Summary (Last 24 hours) at 03/07/12 1521 Last data filed at 03/07/12 1510  Gross per 24 hour  Intake    560 ml  Output   1950 ml  Net  -1390 ml   Filed Weights   03/01/12 2333  Weight: 53.4 kg (117 lb 11.6 oz)    Exam:   General:  NAD, afebrile; AAOX1; still confused; now also with upper extremities weakness and numbness  Cardiovascular: mild tachycardia, no rubs or gallops; no murmur  Respiratory: CTA bilaterally  Abdomen: soft, mild discomfort on deep palpation on her RUQ, ND, positive BS  Extremities: no edema, no erythema, no cyanosis  Neuro: AAOX1; CN intact (except for Left eyes with exophthalmus and abnormal EO movements; upper extremities weakness, 2-3/5 bilaterally; reports some numbness and difficulty with sensation (dropping things out of her hands); patient very ataxic and with abnormal finger to nose. No tremors.  Data Reviewed: Basic Metabolic Panel:  Lab 03/07/12 4782 03/06/12 0440 03/05/12 0452 03/04/12 0520 03/03/12 0441  NA 137 141 143 140 141  K 4.0 3.7 3.0* 3.3* 3.3*  CL 104 110 111 110 113*  CO2 23 18* 17* 18* 16*  GLUCOSE 112* 114* 184* 121* 129*  BUN 4* 3* 5* 3* 4*  CREATININE 0.57 0.55 0.61 0.64 0.65  CALCIUM 9.0 9.0 8.5 8.4 8.2*  MG 2.3 1.0* -- 1.1* --  PHOS -- -- -- 2.5 --   Liver Function Tests:  Lab 03/06/12 0440 03/02/12 0446 03/01/12 1239  AST 29 63* 119*  ALT 48* 92* 140*  ALKPHOS 169* 163* 203*  BILITOT 0.6 0.5 0.8  PROT 6.7 5.6* 7.2  ALBUMIN 3.3* 2.7* 3.5    Lab 03/01/12 1239  LIPASE 235*  AMYLASE --    Lab 03/02/12 0446 03/01/12 2105  AMMONIA 47 27   CBC:  Lab 03/02/12 0446 03/01/12 2105 03/01/12 1239  WBC 8.5 -- 5.7  NEUTROABS -- -- 4.8  HGB  10.0* 11.8* 14.8  HCT 29.2* 34.4* 44.2  MCV 88.0 -- 88.9  PLT 365 -- 270   Cardiac Enzymes:  Lab 03/01/12 1239  CKTOTAL --  CKMB --  CKMBINDEX --  TROPONINI <0.30   CBG:  Lab 03/01/12 1910  GLUCAP 72    Recent Results (from the past 240 hour(s))  CULTURE, BLOOD (ROUTINE X 2)     Status: Normal (Preliminary result)   Collection Time   03/01/12  9:05 PM      Component Value Range Status Comment   Specimen Description BLOOD RIGHT HAND   Final    Special Requests BOTTLES DRAWN AEROBIC ONLY   Final    Culture  Setup Time 03/02/2012 02:03   Final    Culture     Final    Value:        BLOOD CULTURE RECEIVED NO GROWTH TO DATE CULTURE WILL BE HELD FOR 5 DAYS BEFORE ISSUING A FINAL NEGATIVE REPORT   Report Status PENDING   Incomplete   CULTURE, BLOOD (ROUTINE X 2)     Status: Normal (Preliminary result)   Collection Time   03/01/12  9:10 PM      Component Value Range Status Comment   Specimen Description BLOOD RIGHT ARM  Final    Special Requests BOTTLES DRAWN AEROBIC ONLY   Final    Culture  Setup Time 03/02/2012 02:03   Final    Culture     Final    Value:        BLOOD CULTURE RECEIVED NO GROWTH TO DATE CULTURE WILL BE HELD FOR 5 DAYS BEFORE ISSUING A FINAL NEGATIVE REPORT   Report Status PENDING   Incomplete   CLOSTRIDIUM DIFFICILE BY PCR     Status: Normal   Collection Time   03/02/12  7:35 PM      Component Value Range Status Comment   C difficile by pcr NEGATIVE  NEGATIVE Final   URINE CULTURE     Status: Normal   Collection Time   03/03/12  1:34 PM      Component Value Range Status Comment   Specimen Description URINE, CATHETERIZED   Final    Special Requests NONE   Final    Culture  Setup Time 03/04/2012 01:12   Final    Colony Count NO GROWTH   Final    Culture NO GROWTH   Final    Report Status 03/04/2012 FINAL   Final      Studies: No results found.  Scheduled Meds:    . amLODipine  5 mg Oral Daily   And  . irbesartan  150 mg Oral Daily  .  feeding supplement  1 Container Oral TID BM  . folic acid  1 mg Intravenous Daily  . magnesium sulfate 1 - 4 g bolus IVPB  2 g Intravenous Q12H  . pantoprazole (PROTONIX) IV  40 mg Intravenous Q24H  . sodium bicarbonate  650 mg Oral TID  . sodium chloride 0.9 % 50 mL with thiamine (B-1) 500 mg infusion   Intravenous Q8H  . sodium chloride 0.9 % 50 mL with thiamine (B-1) 500 mg infusion   Intravenous Q24H  . sodium chloride  3 mL Intravenous Q12H   Continuous Infusions:    . dextrose 5 % and 0.2 % NaCl with KCl 20 mEq 100 mL/hr (03/07/12 1219)    Time spent: >30 minutes   Creedence Kunesh  [facility] Pager [phone]. If 8PM-8AM, please contact night-coverage at [email or web address], password [facility] 03/07/2012, 3:21 PM  LOS: 6 days

## 2012-03-07 NOTE — Progress Notes (Signed)
CSW met with patient's daughter at bedside. CSW provided bed offers.  Filipe Greathouse C. Markies Mowatt MSW, LCSW [phone]

## 2012-03-07 NOTE — Consult Note (Signed)
Reason for Consult:Upperextremity weakness Referring Physician: Gwenlyn Perking  CC: Dropping things  HPI: Abigail Powell is an 61 y.o. female who allows her daughter to give much of the history.  It seems that they initially presented due to weakness in the lower extremities and confusion.  The patient was found to have multiple medical issues that were addressed but despite this the weakness has persisted and now the patient has weakness in the upper extremities.  She is unable to hold anything to feed herself and still is unable to ambulate.  She has not had a bowel movement since removal of the rectal tube to determine if she is incontinent.  She has a foley in place and therefore urinary incontinence can not be determined.    Past Medical History  Diagnosis Date  . Diverticulosis   . Hypertension   . 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 H/O gastric ulcer     Past Surgical History  Procedure Date  . Cholecystectomy 02/26/2012    Procedure: LAPAROSCOPIC CHOLECYSTECTOMY WITH INTRAOPERATIVE CHOLANGIOGRAM;  Surgeon: Lodema Pilot, DO;  Location: [facility];  Service: General;  Laterality: N/A;  . Abdominal hysterectomy   . Orthoscopic knee surgery   . Abdominal hysterectomy     Family History  Problem Relation Age of Onset  . Ovarian cancer    . Prostate cancer    . Hypertension      Social History:  reports that she has never smoked. She has never used smokeless tobacco. She reports that she drinks alcohol. She reports that she does not use illicit drugs.  Allergies  Allergen Reactions  . Asa (Aspirin) Other (See Comments)    Causes ulcer    Medications:  I have reviewed the patient's current medications. Prior to Admission:  Prescriptions prior to admission  Medication Sig Dispense Refill  . amLODipine-olmesartan (AZOR) 5-20 MG per tablet Take 1 tablet by mouth daily.      . Ascorbic Acid (VITAMIN C PO) Take 1 tablet by mouth daily.      . cephALEXin  (KEFLEX) 500 MG capsule Take 500 mg by mouth 4 (four) times daily.      . Cholecalciferol (VITAMIN D PO) Take 1 tablet by mouth daily.      . fish oil-omega-3 fatty acids 1000 MG capsule Take 1 g by mouth daily.      Marland Kitchen gemfibrozil (LOPID) 600 MG tablet Take 600 mg by mouth 2 (two) times daily before a meal.      . HYDROcodone-acetaminophen (NORCO/VICODIN) 5-325 MG per tablet Take 1-2 tablets by mouth every 4 (four) hours as needed. Pain      . VITAMIN E PO Take 1 capsule by mouth daily.       Scheduled:   . amLODipine  5 mg Oral Daily   And  . irbesartan  150 mg Oral Daily  . feeding supplement  1 Container Oral TID BM  . magnesium sulfate 1 - 4 g bolus IVPB  2 g Intravenous Q12H  . pantoprazole (PROTONIX) IV  40 mg Intravenous Q24H  . sodium bicarbonate  650 mg Oral TID  . sodium chloride 0.9 % 50 mL with thiamine (B-1) 500 mg infusion   Intravenous Q8H  . sodium chloride 0.9 % 50 mL with thiamine (B-1) 500 mg infusion   Intravenous Q24H  . sodium chloride  3 mL Intravenous Q12H  . DISCONTD: folic acid  1 mg Intravenous Daily  ROS: History obtained from daughter  General ROS: negative for - chills, fatigue, fever, night sweats, weight gain or weight loss Psychological ROS: confusion Ophthalmic ROS: exophthalmos ENT ROS: negative for - epistaxis, nasal discharge, oral lesions, sore throat, tinnitus or vertigo Allergy and Immunology ROS: negative for - hives or itchy/watery eyes Hematological and Lymphatic ROS: negative for - bleeding problems, bruising or swollen lymph nodes Endocrine ROS: negative for - galactorrhea, hair pattern changes, polydipsia/polyuria or temperature intolerance Respiratory ROS: negative for - cough, hemoptysis, shortness of breath or wheezing Cardiovascular ROS: negative for - chest pain, dyspnea on exertion, edema or irregular heartbeat Gastrointestinal ROS: negative for - abdominal pain, diarrhea, hematemesis, nausea/vomiting or stool  incontinence Genito-Urinary ROS: negative for - dysuria, hematuria, incontinence or urinary frequency/urgency Musculoskeletal ROS: negative for - joint swelling or muscular weakness Neurological ROS: as noted in HPI Dermatological ROS: negative for rash and skin lesion changes  Physical Examination: Blood pressure 137/84, pulse 103, temperature 98.5 F (36.9 C), temperature source Oral, resp. rate 20, height 5' 2.5" (1.588 m), weight 53.4 kg (117 lb 11.6 oz), SpO2 100.00%.  Neurologic Examination Mental Status: Alert.  Patient knows she is in the hospital but does not know which one.  Reports that it is 2012.  Speech minimal but fluent without evidence of aphasia.  Able to follow commands without difficulty. Cranial Nerves: II: Discs flat bilaterally; Visual fields grossly normal, pupils equal, round, reactive to light and accommodation III,IV, VI: ptosis not present, Left eye deviated laterally.  With right lateral gaze there is nystagmus and left eye is unable to make the complete excursion.  Bilateral exophthalmos, left greater than right V,VII: smile symmetric, facial light touch sensation normal bilaterally VIII: hearing normal bilaterally IX,X: gag reflex present XI: bilateral shoulder shrug XII: midline tongue extension Motor: In the upper extremities the patient is 5-/5 at the deltoids, 4-/5 biceps, 5-/5 triceps-patient slightly stronger on the right than the left.  She is unable to control her movements at the wrists and the hands just flop.  Unable to lift either lower extremity off the recliner with 2+/5 strength noted.   Sensory: Pinprick and light touch decreased from just above the umbilicus downward.  Also decreased pinprick in the upper extremities in the dorsal aspect of the upper arms and throughout below the elbows.   Deep Tendon Reflexes: 2+ in the upper extremities, 1+ at the knees and absent at the ankles Plantars: Right: equivocal   Left:  equivocal Cerebellar: Unable to perform secondary to weakness Gait: Unable to perform secondary to weakness CV: pulses palpable throughout     Laboratory Studies:   Basic Metabolic Panel:  Lab 03/07/12 1610 03/06/12 0440 03/05/12 0452 03/04/12 0520 03/03/12 0441  NA 137 141 143 140 141  K 4.0 3.7 3.0* 3.3* 3.3*  CL 104 110 111 110 113*  CO2 23 18* 17* 18* 16*  GLUCOSE 112* 114* 184* 121* 129*  BUN 4* 3* 5* 3* 4*  CREATININE 0.57 0.55 0.61 0.64 0.65  CALCIUM 9.0 9.0 8.5 -- --  MG 2.3 1.0* -- 1.1* --  PHOS -- -- -- 2.5 --    Liver Function Tests:  Lab 03/06/12 0440 03/02/12 0446 03/01/12 1239  AST 29 63* 119*  ALT 48* 92* 140*  ALKPHOS 169* 163* 203*  BILITOT 0.6 0.5 0.8  PROT 6.7 5.6* 7.2  ALBUMIN 3.3* 2.7* 3.5    Lab 03/01/12 1239  LIPASE 235*  AMYLASE --    Lab 03/02/12 0446 03/01/12 2105  AMMONIA 47 27    CBC:  Lab 03/02/12 0446 03/01/12 2105 03/01/12 1239  WBC 8.5 -- 5.7  NEUTROABS -- -- 4.8  HGB 10.0* 11.8* 14.8  HCT 29.2* 34.4* 44.2  MCV 88.0 -- 88.9  PLT 365 -- 270    Cardiac Enzymes:  Lab 03/01/12 1239  CKTOTAL --  CKMB --  CKMBINDEX --  TROPONINI <0.30    BNP: No components found with this basename: POCBNP:5  CBG:  Lab 03/01/12 1910  GLUCAP 72    Microbiology: Results for orders placed during the hospital encounter of 03/01/12  CULTURE, BLOOD (ROUTINE X 2)     Status: Normal (Preliminary result)   Collection Time   03/01/12  9:05 PM      Component Value Range Status Comment   Specimen Description BLOOD RIGHT HAND   Final    Special Requests BOTTLES DRAWN AEROBIC ONLY   Final    Culture  Setup Time 03/02/2012 02:03   Final    Culture     Final    Value:        BLOOD CULTURE RECEIVED NO GROWTH TO DATE CULTURE WILL BE HELD FOR 5 DAYS BEFORE ISSUING A FINAL NEGATIVE REPORT   Report Status PENDING   Incomplete   CULTURE, BLOOD (ROUTINE X 2)     Status: Normal (Preliminary result)   Collection Time   03/01/12  9:10 PM       Component Value Range Status Comment   Specimen Description BLOOD RIGHT ARM   Final    Special Requests BOTTLES DRAWN AEROBIC ONLY   Final    Culture  Setup Time 03/02/2012 02:03   Final    Culture     Final    Value:        BLOOD CULTURE RECEIVED NO GROWTH TO DATE CULTURE WILL BE HELD FOR 5 DAYS BEFORE ISSUING A FINAL NEGATIVE REPORT   Report Status PENDING   Incomplete   CLOSTRIDIUM DIFFICILE BY PCR     Status: Normal   Collection Time   03/02/12  7:35 PM      Component Value Range Status Comment   C difficile by pcr NEGATIVE  NEGATIVE Final   URINE CULTURE     Status: Normal   Collection Time   03/03/12  1:34 PM      Component Value Range Status Comment   Specimen Description URINE, CATHETERIZED   Final    Special Requests NONE   Final    Culture  Setup Time 03/04/2012 01:12   Final    Colony Count NO GROWTH   Final    Culture NO GROWTH   Final    Report Status 03/04/2012 FINAL   Final     Coagulation Studies: No results found for this basename: LABPROT:5,INR:5 in the last 72 hours  Urinalysis:  Lab 03/03/12 1334 03/01/12 1231  COLORURINE YELLOW YELLOW  LABSPEC 1.013 1.014  PHURINE 6.0 6.0  GLUCOSEU NEGATIVE NEGATIVE  HGBUR NEGATIVE NEGATIVE  BILIRUBINUR NEGATIVE SMALL*  KETONESUR NEGATIVE 15*  PROTEINUR NEGATIVE 30*  UROBILINOGEN 0.2 0.2  NITRITE NEGATIVE NEGATIVE  LEUKOCYTESUR NEGATIVE NEGATIVE    Lipid Panel:  No results found for this basename: chol, trig, hdl, cholhdl, vldl, ldlcalc    HgbA1C:  No results found for this basename: HGBA1C    Urine Drug Screen:   No results found for this basename: labopia, cocainscrnur, labbenz, amphetmu, thcu, labbarb    Alcohol Level: No results found for this basename: ETH:2 in  the last 168 hours   Imaging: No results found.   Assessment/Plan:  61 year old female that has been quite ill with multiple metabolic abnormalities, malnutrition and with a history of alcohol abuse.  Has vascular risk factors.  Exam  is suggestive of a thoracic lesion.  Although family reports patient unable to walk at presentation, there is documentation of 5/5 strength throughout.  Patient is clearly not 5/5 at this time and has upper extremity involvement.  Unclear the exact onset since the patient has been so ill.  With her clinical history can not rule out the possibility of a cord infarct. A compressive lesion is possible as well but less likely.    Recommendations: 1.  Would cancel MRI of the brain and schedule MRI of the cervical and thoracic spine.   2.  Will follow up results and make further recommendations at that time.    Thana Farr, MD [facility] [phone] 03/07/2012, 6:20 PM
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
## 2012-03-07 NOTE — Progress Notes (Signed)
[facility] Progress Note  Subjective:  Confusion has showed no improvement.  Ate a whole blueberry muffin this morning.  Says that she has abdominal pain.  Objective:  Vital signs in last 24 hours: Temp:  [98.2 F (36.8 C)-98.4 F (36.9 C)] 98.4 F (36.9 C) (10/29 0508) Pulse Rate:  [96-114] 96  (10/29 0508) Resp:  [18-20] 18  (10/29 0508) BP: (137-150)/(80-89) 149/89 mmHg (10/29 0508) SpO2:  [99 %-100 %] 100 % (10/29 0508) Last BM Date: 03/03/12 General:   Alert, Well-developed, in NAD; confused.  Shaking her right leg. Eyes:  B/L exophthalmus > on the left.  Heart:  Regular rate and rhythm; no murmurs Pulm:  CTAB.  No W/R/R. Abdomen:  Soft, nondistended. Normal bowel sounds.  Minimal diffuse TTP. Extremities:  Without edema. 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  Intake/Output from previous day: 10/28 0701 - 10/29 0700 In: 1620 [P.O.:220; I.V.:1400] Out: 2250 [Urine:2250]  BMET  Basename 03/07/12 0450 03/06/12 0440 03/05/12 0452  NA 137 141 143  K 4.0 3.7 3.0*  CL 104 110 111  CO2 23 18* 17*  GLUCOSE 112* 114* 184*  BUN 4* 3* 5*  CREATININE 0.57 0.55 0.61  CALCIUM 9.0 9.0 8.5   LFT  Basename 03/06/12 0440  PROT 6.7  ALBUMIN 3.3*  AST 29  ALT 48*  ALKPHOS 169*  BILITOT 0.6  BILIDIR 0.2  IBILI 0.4   Assessment / Plan: 1) Abnormal LFT's s/p laparoscopic cholecystectomy for cholelithiasis (negative IOC). LFT's are continuing to slowly improve since cholecystectomy.  2) Nausea/vomiting/abdominal pain. Seems to have resolved.  3) Metabolic encephalopathy.  ? Wernicke's encephalopathy?  ? Could this be related to her hyperthyroidism?  4) HTN/Hyperlipidemia.  5) Colonic diverticulosis.  6) ETOH abuse: Indicated by her daughter yesterday. But she does not have cirrhosis on any imaging studies and pattern is not consistent with ETOH liver disease, so not HE. 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  Imaging confirms that this is not causing any intracranial abnormalities.  -Would recheck LFT's again in a week to be sure they have continued to trend down and return to normal.  -Continue IV thiamine. -Consider Neurology consult   LOS: 6 days   ZEHR, JESSICA D.  03/07/2012, 9:15 AM  Pager number [phone]    I have taken an interval history, reviewed the chart and examined the patient. I agree with Otho Darner note, impression and recommendations. No additional recommendations at this time. GI signing off.  Venita Lick. Russella Dar MD [facility]

## 2012-03-08 ENCOUNTER — Inpatient Hospital Stay (HOSPITAL_COMMUNITY): Payer: BC Managed Care – HMO

## 2012-03-08 ENCOUNTER — Encounter (INDEPENDENT_AMBULATORY_CARE_PROVIDER_SITE_OTHER): Payer: Self-pay | Admitting: General Surgery

## 2012-03-08 LAB — CULTURE, BLOOD (ROUTINE X 2): Culture: NO GROWTH

## 2012-03-08 LAB — BASIC METABOLIC PANEL
BUN: 5 mg/dL — ABNORMAL LOW (ref 6–23)
CO2: 21 mEq/L (ref 19–32)
Glucose, Bld: 100 mg/dL — ABNORMAL HIGH (ref 70–99)
Potassium: 4 mEq/L (ref 3.5–5.1)
Sodium: 134 mEq/L — ABNORMAL LOW (ref 135–145)

## 2012-03-08 MED ORDER — LORAZEPAM 2 MG/ML IJ SOLN
1.0000 mg | Freq: Once | INTRAMUSCULAR | Status: AC
  Administered 2012-03-08: 1 mg via INTRAVENOUS
  Filled 2012-03-08: qty 1

## 2012-03-08 MED ORDER — LORAZEPAM 2 MG/ML IJ SOLN
1.0000 mg | Freq: Once | INTRAMUSCULAR | Status: AC
  Administered 2012-03-08: 1 mg via INTRAVENOUS

## 2012-03-08 MED ORDER — LORAZEPAM 2 MG/ML IJ SOLN
INTRAMUSCULAR | Status: AC
  Filled 2012-03-08: qty 1

## 2012-03-08 NOTE — Progress Notes (Signed)
SLP attempted to see pt for follow up to assess tolerance of diet.  Pt currently off floor.  SLP spoke to Clydie Braun, RN earlier in the day who reported pt ate approximately 50% of meal and is tolerating medicine given with applesauce.    SLP to follow up later in the week.     Donavan Burnet, MS [facility] SLP [phone]

## 2012-03-08 NOTE — Progress Notes (Signed)
No changes overnight. Patient reports that her arm weakness was sudden in onset. She states that 1 minute she was able to move her hands, and a few minutes later she was unable to. It is not clear if her lower extremity weakness started at the same time given that she was still recovering since being discharged from her cholecystectomy.   Her daughter reports that her mental status waxes and wanes.  Exam: Filed Vitals:   03/08/12 0607  BP: 149/82  Pulse: 112  Temp: 98.5 F (36.9 C)  Resp: 20   Mental status: Awake, alert, oriented to person, month, year, hospital but not [facility] long She is able to spell world backwards without difficulty Motor: She has a distal much greater than proximal weakness that affects her entire arms, as well as bilateral legs. She has 3/5 strength in dorsiflexion bilaterally, 2/5 strength in hip flexion, 2/5 wrist extension, 3/5 grip, 4+/5 at the deltoid.  Sensory: Decreased to temperature in the lower extremities, intact vibration DTR: 2+ at the biceps, 1+ at Brachoradialis, trace at the patella, and absent at the ankles   Impression: 61 year old female with sudden onset weakness started 7 days ago. The symmetric nature would suggest localization in either the spinal cord or peripheral nerves. The motor greater than sensory deficit is concerning for an anterior cord stroke, however her MRI C-spine did not clearly show this. Diffusion imaging was not performed, and this may reveal a spinal stroke.  The sudden onset that the patient describes would argue against a peripheral nerve etiology, however the history is suspect given the patient's confusion. The daughter, however confirms that her symptoms do not seem to be getting worse, and seemed to start relatively suddenly.  Recommendations: 1) MRI C-spine with diffusion imaging 2) If this not reveal cause, then we'll proceed with a lumbar puncture and discus the possibility of getting an EMG

## 2012-03-08 NOTE — Progress Notes (Signed)
Patient ID: Abigail Powell, female   DOB: 30-Oct-1950, 61 y.o.   MRN: [id number]  [facility] PROGRESS NOTE  Abigail Powell HYQ:[id number] DOB: February 18, 1951 DOA: 03/01/2012 PCP: Sheila Oats, MD  Brief narrative: Pt is 61 year old female who is status post cholecystectomy 02/26/2012 and has required an admission 03/01/2012 for further evaluation of progressively worsening mental status associated with poor oral intake and intermittent episodes of watery diarrhea, exertional shortness of breath, and generalized weakness. 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 She has an appointment at [facility] with a ophthalmologist for biopsy  Active Problems:  Encephalopathy acute - unclear what the exact etiology is at this time but possibly multifactorial and secondary to acute illness, alcohol use, progressive deconditioning and failure to thrive - slight clinical improvement noted since admission - recommendation is to discharge pt to SNF for further rehabilitation - appreciate neurology input Protein calorie malnutrition, moderate  - SLP done and recommendation is to continue Dysphagia III diet - encouraged PO intake  - albumin on admission 2 Weakness, upper and lower extremity and bilateral - this is also multifactorial in etiology as noted above and will require physical rehabilitation upon discharge - appreciate PT input - will continue to follow up on neurologist recommendations   Hypertension - stable - will continue Norvasc and Avapro  Anemia of chronic disease - Hg and Hct remain stable and at pt's baseline  - CBC In AM Hyperlipidemia - continue statin  Subclinical hyperthyroidism - could also contribute to progressive deconditioning  Hypokalemia - secondary to diarrhea and vomiting which are now resolved - also low magnesium level contributing  - will continue to supplement as indicated  - BMP in AM  Transaminitis -  secondary to recent surgical manipulation - HIDA scan with no biliary leak indicated - LTF's trending down - appreciate GI involvement  Alcohol abuse - will address the medical issue once pt is more ready for discussion as right now she is still rather weak to participate in meaningful conversation   Quadriparesis - contributing to progressive failure to thrive - PT while inpatient   Consultants:  GI  Surgery  Neurology   Physical therapy   Procedures/Studies: Mr Cervical Spine Wo Contrast 03/08/2012    IMPRESSION:  The study is moderately degraded by motion artifact but I feel it is diagnostic.  1.  Multilevel degenerative facet arthritis with multilevel slight foraminal stenosis as described above.  2.  No disc protrusions or focal neural impingement.  3.  Normal cervical spinal cord.     Antibiotics:  None  Code Status: Full Family Communication: Pt at bedside Disposition Plan: SNF  HPI/Subjective: No events overnight.   Objective: Filed Vitals:   03/07/12 0508 03/07/12 1509 03/07/12 2010 03/08/12 0607  BP: 149/89 137/84 135/84 149/82  Pulse: 96 103 112 112  Temp: 98.4 F (36.9 C) 98.5 F (36.9 C) 99.2 F (37.3 C) 98.5 F (36.9 C)  TempSrc: Oral Oral Oral Oral  Resp: 18 20 16 20   Height:      Weight:      SpO2: 100% 100% 100% 97%    Intake/Output Summary (Last 24 hours) at 03/08/12 1328 Last data filed at 03/08/12 1108  Gross per 24 hour  Intake   1150 ml  Output   2000 ml  Net   -850 ml    Exam:   General:  Pt is slightly somnolent but follows commands appropriately, not in acute distress  Cardiovascular: Regular rate and rhythm, S1/S2, no murmurs, no rubs, no gallops  Respiratory: Clear to auscultation bilaterally, slightly decreased breath sounds at bases  Abdomen: Soft, non tender, non distended, bowel sounds present, no guarding  Extremities: Trace bilateral pitting edema, pulses DP and PT palpable bilaterally  Neuro: Somnolent  this AM but follows commands appropriately, oriented to name only, bilateral upper and lower extremity weakness  Data Reviewed: Basic Metabolic Panel:  Lab 03/08/12 9604 03/07/12 0450 03/06/12 0440 03/05/12 0452 03/04/12 0520  NA 134* 137 141 143 140  K 4.0 4.0 3.7 3.0* 3.3*  CL 103 104 110 111 110  CO2 21 23 18* 17* 18*  GLUCOSE 100* 112* 114* 184* 121*  BUN 5* 4* 3* 5* 3*  CREATININE 0.60 0.57 0.55 0.61 0.64  CALCIUM 9.2 9.0 9.0 8.5 8.4  MG -- 2.3 1.0* -- 1.1*  PHOS -- -- -- -- 2.5   Liver Function Tests:  Lab 03/06/12 0440 03/02/12 0446  AST 29 63*  ALT 48* 92*  ALKPHOS 169* 163*  BILITOT 0.6 0.5  PROT 6.7 5.6*  ALBUMIN 3.3* 2.7*    Lab 03/02/12 0446 03/01/12 2105  AMMONIA 47 27   CBC:  Lab 03/02/12 0446 03/01/12 2105  WBC 8.5 --  NEUTROABS -- --  HGB 10.0* 11.8*  HCT 29.2* 34.4*  MCV 88.0 --  PLT 365 --   CBG:  Lab 03/01/12 1910  GLUCAP 72    Recent Results (from the past 240 hour(s))  CULTURE, BLOOD (ROUTINE X 2)     Status: Normal   Collection Time   03/01/12  9:05 PM      Component Value Range Status Comment   Specimen Description BLOOD RIGHT HAND   Final    Special Requests BOTTLES DRAWN AEROBIC ONLY   Final    Culture  Setup Time 03/02/2012 02:03   Final    Culture NO GROWTH 5 DAYS   Final    Report Status 03/08/2012 FINAL   Final   CULTURE, BLOOD (ROUTINE X 2)     Status: Normal   Collection Time   03/01/12  9:10 PM      Component Value Range Status Comment   Specimen Description BLOOD RIGHT ARM   Final    Special Requests BOTTLES DRAWN AEROBIC ONLY   Final    Culture  Setup Time 03/02/2012 02:03   Final    Culture NO GROWTH 5 DAYS   Final    Report Status 03/08/2012 FINAL   Final   CLOSTRIDIUM DIFFICILE BY PCR     Status: Normal   Collection Time   03/02/12  7:35 PM      Component Value Range Status Comment   C difficile by pcr NEGATIVE  NEGATIVE Final   URINE CULTURE     Status: Normal   Collection Time   03/03/12  1:34 PM        Component Value Range Status Comment   Specimen Description URINE, CATHETERIZED   Final    Special Requests NONE   Final    Culture  Setup Time 03/04/2012 01:12   Final    Colony Count NO GROWTH   Final    Culture NO GROWTH   Final    Report Status 03/04/2012 FINAL   Final      Scheduled Meds:   . amLODipine  5 mg Oral Daily   And  . irbesartan  150 mg Oral Daily  . feeding supplement  1 Container  Oral TID BM  . pantoprazole (PROTONIX) IV  40 mg Intravenous Q24H  . sodium chloride 0.9 % 50 mL with thiamine (B-1) 500 mg infusion   Intravenous Q8H  . sodium chloride 0.9 % 50 mL with thiamine (B-1) 500 mg infusion   Intravenous Q24H  . sodium chloride  3 mL Intravenous Q12H  . DISCONTD: folic acid  1 mg Intravenous Daily   Continuous Infusions:   . dextrose 5 % and 0.2 % NaCl with KCl 20 mEq 50 mL (03/08/12 1025)     Debbora Presto, MD  [facility] Pager [phone]  If 7PM-7AM, please contact night-coverage [email or web address] Password [email or web address] 03/08/2012, 1:28 PM   LOS: 7 days

## 2012-03-09 ENCOUNTER — Inpatient Hospital Stay (HOSPITAL_COMMUNITY): Payer: BC Managed Care – HMO

## 2012-03-09 DIAGNOSIS — R29818 Other symptoms and signs involving the nervous system: Secondary | ICD-10-CM

## 2012-03-09 LAB — BLOOD GAS, ARTERIAL
Drawn by: 310571
O2 Saturation: 95.8 %
Patient temperature: 98.6
pH, Arterial: 7.417 (ref 7.350–7.450)
pO2, Arterial: 82.5 mmHg (ref 80.0–100.0)

## 2012-03-09 LAB — CK TOTAL AND CKMB (NOT AT ARMC): Relative Index: INVALID (ref 0.0–2.5)

## 2012-03-09 LAB — BASIC METABOLIC PANEL
BUN: 6 mg/dL (ref 6–23)
CO2: 20 mEq/L (ref 19–32)
Calcium: 9.2 mg/dL (ref 8.4–10.5)
Creatinine, Ser: 0.58 mg/dL (ref 0.50–1.10)
Glucose, Bld: 136 mg/dL — ABNORMAL HIGH (ref 70–99)

## 2012-03-09 LAB — CBC
Hemoglobin: 11.7 g/dL — ABNORMAL LOW (ref 12.0–15.0)
MCH: 30.2 pg (ref 26.0–34.0)
MCV: 91 fL (ref 78.0–100.0)
RBC: 3.88 MIL/uL (ref 3.87–5.11)

## 2012-03-09 LAB — CSF CELL COUNT WITH DIFFERENTIAL
RBC Count, CSF: 4180 /mm3 — ABNORMAL HIGH
Tube #: 1
WBC, CSF: 1 /mm3 (ref 0–5)

## 2012-03-09 MED ORDER — IMMUNE GLOBULIN (HUMAN) 10 GM/200ML IV SOLN
400.0000 mg/kg | INTRAVENOUS | Status: AC
  Administered 2012-03-09 – 2012-03-13 (×5): 20 g via INTRAVENOUS
  Filled 2012-03-09 (×5): qty 400

## 2012-03-09 MED ORDER — LORAZEPAM 2 MG/ML IJ SOLN
2.0000 mg | Freq: Once | INTRAMUSCULAR | Status: AC
  Administered 2012-03-09: 2 mg via INTRAVENOUS
  Filled 2012-03-09: qty 1

## 2012-03-09 NOTE — Progress Notes (Signed)
CSW met with patient's daughter at bedside. They are touring facilities today and will know by tomorrow am where they would like patient to go.  Dwaine Pringle C. Shamonique Battiste MSW, LCSW [phone]

## 2012-03-09 NOTE — Progress Notes (Signed)
PT Cancellation Note  Patient Details Name: Abigail Powell MRN: [id number] DOB: Sep 26, 1950   Cancelled Treatment:    Reason Eval/Treat Not Completed: Patient at procedure or test/unavailable.  Will follow.    [name] 03/09/2012, 11:09 AM [phone]

## 2012-03-09 NOTE — Progress Notes (Signed)
Patient ID: Abigail Powell, female   DOB: [birth year], 61 y.o.   MRN: [id number]  [facility] PROGRESS NOTE  Abigail Powell WUJ:[id number] DOB: 1951/02/12 DOA: 03/01/2012 PCP: Sheila Oats, MD  Brief narrative:  Pt is 61 year old female who is status post cholecystectomy 02/26/2012 and has required an admission 03/01/2012 for further evaluation of progressively worsening mental status associated with poor oral intake and intermittent episodes of watery diarrhea, exertional shortness of breath, and generalized weakness. 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 She has an appointment at [facility] with a ophthalmologist for biopsy   Active Problems:  Encephalopathy acute  - unclear what the exact etiology is at this time but possibly multifactorial and secondary to acute illness, alcohol use, progressive deconditioning and failure to thrive  - slight clinical improvement noted since admission  - recommendation is to discharge pt to SNF for further rehabilitation  - appreciate neurology input  - LP is planned for today Protein calorie malnutrition, moderate  - SLP done and recommendation is to continue Dysphagia III diet  - encouraged PO intake  - albumin on admission 2  Weakness, upper and lower extremity and bilateral  - this is also multifactorial in etiology as noted above and will require physical rehabilitation upon discharge  - appreciate PT input  - will continue to follow up on neurologist recommendations  Hypertension  - stable  - will continue Norvasc and Avapro  Anemia of chronic disease  - Hg and Hct remain stable and at pt's baseline  - CBC In AM  Hyperlipidemia  - continue statin  Subclinical hyperthyroidism  - could also contribute to progressive deconditioning  Hypokalemia  - secondary to diarrhea and vomiting which are now resolved  - also low magnesium level contributing but now resolved as well - will  continue to supplement as indicated  - BMP in AM  Transaminitis  - secondary to recent surgical manipulation  - HIDA scan with no biliary leak indicated  - LTF's trending down  - appreciate GI involvement  Alcohol abuse  - will address the medical issue once pt is more ready for discussion as right now she is still rather weak to participate in meaningful conversation  Quadriparesis  - contributing to progressive failure to thrive  - PT while inpatient  - LP planned for today  Consultants:  GI  Surgery  Neurology  Physical therapy   Procedures/Studies:  Mr Cervical Spine Wo Contrast  03/08/2012  IMPRESSION:  The study is moderately degraded by motion artifact but I feel it is diagnostic.  1. Multilevel degenerative facet arthritis with multilevel slight foraminal stenosis as described above.  2. No disc protrusions or focal neural impingement.  3. Normal cervical spinal cord.   Antibiotics:  None  Code Status: Full  Family Communication: Pt and daughter at bedside  Disposition Plan: SNF or transfer to tertiary medical center.   HPI/Subjective: No events overnight.   Objective: Filed Vitals:   03/08/12 1903 03/08/12 2133 03/09/12 0455 03/09/12 1300  BP: 134/86 136/91 130/90 129/85  Pulse: 111 114 114 99  Temp: 98.8 F (37.1 C) 98.6 F (37 C) 98.6 F (37 C) 98.7 F (37.1 C)  TempSrc: Oral Oral Oral   Resp: 19 22 24 23   Height:      Weight:      SpO2: 100% 100% 98% 99%    Intake/Output Summary (Last 24 hours) at 03/09/12 1431 Last data filed at 03/09/12  0700  Gross per 24 hour  Intake   1770 ml  Output   1200 ml  Net    570 ml    Exam:   General:  Pt is somnolent, follows commands appropriately when awake, not in acute distress  Cardiovascular: Regular rhythm, tachycardia, S1/S2, no murmurs, no rubs, no gallops  Respiratory: Clear to auscultation bilaterally, no wheezing, no crackles, no rhonchi  Abdomen: Soft, non tender, non distended, bowel  sounds present, no guarding  Extremities: Trace bilateral pitting edema, pulses DP and PT palpable bilaterally  Data Reviewed: Basic Metabolic Panel:  Lab 03/09/12 1610 03/08/12 0445 03/07/12 0450 03/06/12 0440 03/05/12 0452 03/04/12 0520  NA 134* 134* 137 141 143 --  K 3.7 4.0 4.0 3.7 3.0* --  CL 102 103 104 110 111 --  CO2 20 21 23  18* 17* --  GLUCOSE 136* 100* 112* 114* 184* --  BUN 6 5* 4* 3* 5* --  CREATININE 0.58 0.60 0.57 0.55 0.61 --  CALCIUM 9.2 9.2 9.0 9.0 8.5 --  MG -- -- 2.3 1.0* -- 1.1*  PHOS -- -- -- -- -- 2.5   Liver Function Tests:  Lab 03/06/12 0440  AST 29  ALT 48*  ALKPHOS 169*  BILITOT 0.6  PROT 6.7  ALBUMIN 3.3*   CBC:  Lab 03/09/12 0509  WBC 16.6*  NEUTROABS --  HGB 11.7*  HCT 35.3*  MCV 91.0  PLT 499*   Cardiac Enzymes:  Lab 03/09/12 1000  CKTOTAL 30  CKMB 2.2  CKMBINDEX --  TROPONINI --     Recent Results (from the past 240 hour(s))  CULTURE, BLOOD (ROUTINE X 2)     Status: Normal   Collection Time   03/01/12  9:05 PM      Component Value Range Status Comment   Specimen Description BLOOD RIGHT HAND   Final    Special Requests BOTTLES DRAWN AEROBIC ONLY   Final    Culture  Setup Time 03/02/2012 02:03   Final    Culture NO GROWTH 5 DAYS   Final    Report Status 03/08/2012 FINAL   Final   CULTURE, BLOOD (ROUTINE X 2)     Status: Normal   Collection Time   03/01/12  9:10 PM      Component Value Range Status Comment   Specimen Description BLOOD RIGHT ARM   Final    Special Requests BOTTLES DRAWN AEROBIC ONLY   Final    Culture  Setup Time 03/02/2012 02:03   Final    Culture NO GROWTH 5 DAYS   Final    Report Status 03/08/2012 FINAL   Final   CLOSTRIDIUM DIFFICILE BY PCR     Status: Normal   Collection Time   03/02/12  7:35 PM      Component Value Range Status Comment   C difficile by pcr NEGATIVE  NEGATIVE Final   URINE CULTURE     Status: Normal   Collection Time   03/03/12  1:34 PM      Component Value Range  Status Comment   Specimen Description URINE, CATHETERIZED   Final    Special Requests NONE   Final    Culture  Setup Time 03/04/2012 01:12   Final    Colony Count NO GROWTH   Final    Culture NO GROWTH   Final    Report Status 03/04/2012 FINAL   Final      Scheduled Meds:   . amLODipine  5 mg  Oral Daily   And  . irbesartan  150 mg Oral Daily  . feeding supplement  1 Container Oral TID BM  . LORazepam      . LORazepam  1 mg Intravenous Once  . LORazepam  1 mg Intravenous Once  . LORazepam  2 mg Intravenous Once  . pantoprazole (PROTONIX) IV  40 mg Intravenous Q24H  . sodium chloride 0.9 % 50 mL with thiamine (B-1) 500 mg infusion   Intravenous Q24H  . sodium chloride  3 mL Intravenous Q12H   Continuous Infusions:   . dextrose 5 % and 0.2 % NaCl with KCl 20 mEq 50 mL/hr at 03/09/12 0850     Debbora Presto, MD  [facility] Pager [phone]  If 7PM-7AM, please contact night-coverage [email or web address] Password [email or web address] 03/09/2012, 2:31 PM   LOS: 8 days

## 2012-03-09 NOTE — Progress Notes (Signed)
Nutrition Follow-up  Intervention:  1. Recommend obtain new weight on patient.  2. Diet advancements as medically able. Once diet advanced, recommend patient continue to receive Resource breeze TID.  3. RD to follow for nutrition plan of care.   Assessment:   Patient is currently NPO. Prior to NPO status, pt family reported she likes the resource breeze. They reported she ate well last night, most of her dinner meal. They reported patient is reporting she is hungry. She also stated patient has had some swallowing difficulties and no BM over the past few days.   Diet Order:  NPO  Meds: Scheduled Meds:   . amLODipine  5 mg Oral Daily   And  . irbesartan  150 mg Oral Daily  . feeding supplement  1 Container Oral TID BM  . LORazepam      . LORazepam  1 mg Intravenous Once  . LORazepam  1 mg Intravenous Once  . LORazepam  2 mg Intravenous Once  . pantoprazole (PROTONIX) IV  40 mg Intravenous Q24H  . sodium chloride 0.9 % 50 mL with thiamine (B-1) 500 mg infusion   Intravenous Q24H  . sodium chloride  3 mL Intravenous Q12H   Continuous Infusions:   . dextrose 5 % and 0.2 % NaCl with KCl 20 mEq 50 mL/hr at 03/09/12 0850   PRN Meds:.acetaminophen, acetaminophen, prochlorperazine, zolpidem   CMP     Component Value Date/Time   NA 134* 03/09/2012 0509   K 3.7 03/09/2012 0509   CL 102 03/09/2012 0509   CO2 20 03/09/2012 0509   GLUCOSE 136* 03/09/2012 0509   BUN 6 03/09/2012 0509   CREATININE 0.58 03/09/2012 0509   CALCIUM 9.2 03/09/2012 0509   PROT 6.7 03/06/2012 0440   ALBUMIN 3.3* 03/06/2012 0440   AST 29 03/06/2012 0440   ALT 48* 03/06/2012 0440   ALKPHOS 169* 03/06/2012 0440   BILITOT 0.6 03/06/2012 0440   GFRNONAA >90 03/09/2012 0509   GFRAA >90 03/09/2012 0509    CBG (last 3)  No results found for this basename: GLUCAP:3 in the last 72 hours   Intake/Output Summary (Last 24 hours) at 03/09/12 1426 Last data filed at 03/09/12 0700  Gross per 24 hour  Intake   1770  ml  Output   1200 ml  Net    570 ml    Weight Status:  NO NEW WEIGHT. Recommend re-weight patient. Previous wt. 117 lb.   Re-estimated needs remain the same :  1400-1500 kcal and 63-80 grams protein.  Nutrition Dx:  Inadequate oral intake, ongoing.   Goal:  1. Diet advancements as medically able to meet > 90% of estimated energy needs.   Monitor:  Diet advancements, PO intake, weights, labs   Adron Bene [phone]

## 2012-03-09 NOTE — Progress Notes (Signed)
NEURO HOSPITALIST PROGRESS NOTE   SUBJECTIVE:                                                                                                                        No changes over night. She states that she did have leg weakness prior to arm weakness.   OBJECTIVE:                                                                                                                           Vital signs in last 24 hours: Temp:  [98.6 F (37 C)-98.8 F (37.1 C)] 98.6 F (37 C) (10/31 0455) Pulse Rate:  [111-114] 114  (10/31 0455) Resp:  [19-24] 24  (10/31 0455) BP: (130-136)/(86-91) 130/90 mmHg (10/31 0455) SpO2:  [98 %-100 %] 98 % (10/31 0455)  Intake/Output from previous day: 10/30 0701 - 10/31 0700 In: 1830 [P.O.:580; I.V.:1250] Out: 1800 [Urine:1800] Intake/Output this shift:   Nutritional status: NPO  Past Medical History  Diagnosis Date  . Diverticulosis   . Hypertension   . 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 H/O gastric ulcer     Neurologic ROS negative with exception of above. Musculoskeletal ROS none  Neurologic Exam:  Mental Status: Awake, interacting with husband.  Cranial Nerves: II: Discs flat bilaterally; Visual fields grossly normal, pupils equal, round, reactive to light and accommodation III,IV, VI: exopthalomus left>right eye with slight lateral deviation. With EOM left eye does not move medially. Extra-ocular motions intact on right V,VII: smile symmetric, facial light touch sensation normal bilaterally VIII: hearing normal bilaterally IX,X: gag reflex present XI: bilateral shoulder shrug XII: midline tongue extension Motor: She has a distal much greater than proximal weakness that affects her entire arms, as well as bilateral legs. She has  4+/5 at the deltoid, 4-/5 bicep 3/5 strength with wrist flexion and grip, 1/5 with wrist extension 1/5 thumb abduction, 1/5 interossei.  2-3/5  strength in hip flexion, 3/5 strength at bilateral DF/PF.  Sensory: Decreased to temperature in the lower extremities, intact vibration Deep Tendon Reflexes: 1+ brachio, 2+ bicep, 1+ tricep, no KJ no AJ Plantars: mute  CV: pulses palpable throughout     Lab  Results: No results found for this basename: cbc, bmp, coags, chol, tri, ldl, hga1c   Lipid Panel No results found for this basename: CHOL,TRIG,HDL,CHOLHDL,VLDL,LDLCALC in the last 72 hours  Studies/Results: Mr Cervical Spine Wo Contrast  03/08/2012  *RADIOLOGY REPORT*  Clinical Data: Right upper extremity numbness, weakness, and posturing.  MRI CERVICAL SPINE WITHOUT CONTRAST  Technique:  Multiplanar and multiecho pulse sequences of the cervical spine, to include the craniocervical junction and cervicothoracic junction, were obtained according to standard protocol without intravenous contrast.  Comparison: None.  Findings: The visualized intracranial contents are normal. Cervical spinal cord is normal with no mass lesion or myelopathy or spinal cord compression.  C2-3:  Moderate right facet arthritis.  Normal disc.  C3-4:  Moderate right and slight left facet arthritis.  Minimal uncinate spurring to the right without foraminal stenosis.  C4-5:  Mild right and moderate left facet arthritis.  Uncinate spurring to the left with slight left foraminal stenosis.  C5-6:  Disc space narrowing with uncinate spurs to the right and left with moderate left facet arthritis.  No significant foraminal stenosis.  C6-7:  Disc space narrowing with uncinate spurring to the right and left with slight bilateral foraminal stenosis.  C7-T1:  Moderate right and moderately severe left facet arthritis. Disc is normal.  T1-2 through T4-5:  No significant abnormalities.  IMPRESSION: The study is moderately degraded by motion artifact but I feel it is diagnostic. 1.  Multilevel degenerative facet arthritis with multilevel slight foraminal stenosis as described above. 2.   No disc protrusions or focal neural impingement. 3.  Normal cervical spinal cord.   Original Report Authenticated By: Gwynn Burly, M.D.    Mr Thoracic Spine Wo Contrast  03/09/2012  *RADIOLOGY REPORT*  Clinical Data:  Quadriparesis.  Rule out cord infarction or cord compression.  MRI THORACIC SPINE WITHOUT CONTRAST  Technique:  Multiplanar and multiecho pulse sequences of the thoracic spine were obtained without intravenous contrast.  Comparison:   None.  Findings:  The patient was sedated with IV Ativan.  The patient  will return for additional imaging of the cervical spine and these results will   be reported in an addendum.  Mild levoscoliosis in the thoracic spine.  Negative for fracture or mass.  No cord compression.  Spinal cord signal is normal.  Mild disc degeneration and spurring is present in the mid thoracic spine at multiple levels.  No significant spinal stenosis or cord deformity is identified. Mild atelectasis in the lung bases.  IMPRESSION: Mild thoracic degenerative changes.  No cord infarct, mass, or cord compression is identified.   Original Report Authenticated By: Janeece Riggers, M.D.    Mr C Spine Ltd W/o Cm  03/09/2012  *RADIOLOGY REPORT*  Clinical Data:  Quadriparesis.  Rule out cord infarction or cord compression.  MRI THORACIC SPINE WITHOUT CONTRAST  Technique:  Multiplanar and multiecho pulse sequences of the thoracic spine were obtained without intravenous contrast.  Comparison:   None.  Findings:  The patient was sedated with IV Ativan.  The patient  will return for additional imaging of the cervical spine and these results will   be reported in an addendum.  Mild levoscoliosis in the thoracic spine.  Negative for fracture or mass.  No cord compression.  Spinal cord signal is normal.  Mild disc degeneration and spurring is present in the mid thoracic spine at multiple levels.  No significant spinal stenosis or cord deformity is identified. Mild atelectasis in the lung bases.   IMPRESSION:  Mild thoracic degenerative changes.  No cord infarct, mass, or cord compression is identified.   Original Report Authenticated By: Janeece Riggers, M.D.     MEDICATIONS                                                                                                                        Scheduled:   . amLODipine  5 mg Oral Daily   And  . irbesartan  150 mg Oral Daily  . feeding supplement  1 Container Oral TID BM  . LORazepam      . LORazepam  1 mg Intravenous Once  . LORazepam  1 mg Intravenous Once  . pantoprazole (PROTONIX) IV  40 mg Intravenous Q24H  . sodium chloride 0.9 % 50 mL with thiamine (B-1) 500 mg infusion   Intravenous Q24H  . sodium chloride  3 mL Intravenous Q12H    ASSESSMENT/PLAN:                                                                                                             Patient Active Hospital Problem List: Weakness (03/01/2012)   Assessment: 7 Day history of bilateral distal UE and bilateral LE weakness. She is areflexic distally and has a distal > proximal weakness, though the upper extremities are affected slightly more than the lowers. Her CSF is elevated, even correcting for the RBCs present. Her weakness does not fit a single or multiple radicular or nerve distribution. Therefore with her protein elevation with normal WBC count, this most likely represents a Guillian-Barre variant. A pareaneoplastic neuropathy is difficult to exclude, but the elevated protein is much more suggestive of Guillian-Barre. I would favor treating with IVIG.   1)  IVIG 400mg /kg per day x 5 days.  2) Will likely need long term PT or rehab.     Felicie Morn PA-C [facility] [phone]  03/09/2012, 8:37 AM    I have seen and evaluated the patient. I have reviewed the above note and made appropriate changes.   Ritta Slot, MD [facility] [phone]  If 7pm- 7am, please page neurology on call at [phone].

## 2012-03-09 NOTE — Progress Notes (Signed)
SLP Cancellation Note  Patient Details Name: Abigail Powell MRN: [id number] DOB: 07/18/1950   Cancelled treatment:        RN reports patient has been sedated with ativan and has not eaten all day.  Patient is unable to maintain adequate LOA to attempt p.o.'s at this time.  SLP will f/u 11/1.   Maryjo Rochester T 03/09/2012, 2:48 PM

## 2012-03-10 DIAGNOSIS — R29818 Other symptoms and signs involving the nervous system: Secondary | ICD-10-CM

## 2012-03-10 DIAGNOSIS — F29 Unspecified psychosis not due to a substance or known physiological condition: Secondary | ICD-10-CM

## 2012-03-10 LAB — CBC
HCT: 35.8 % — ABNORMAL LOW (ref 36.0–46.0)
MCHC: 33.2 g/dL (ref 30.0–36.0)
Platelets: 502 10*3/uL — ABNORMAL HIGH (ref 150–400)
RDW: 12.9 % (ref 11.5–15.5)

## 2012-03-10 LAB — BASIC METABOLIC PANEL
BUN: 6 mg/dL (ref 6–23)
GFR calc Af Amer: >90 mL/min (ref >90)
GFR calc non Af Amer: >90 mL/min (ref >90)
Potassium: 3.6 mEq/L (ref 3.5–5.1)

## 2012-03-10 LAB — URINE MICROSCOPIC-ADD ON

## 2012-03-10 LAB — URINALYSIS, ROUTINE W REFLEX MICROSCOPIC
Bilirubin Urine: NEGATIVE
Glucose, UA: NEGATIVE mg/dL
Ketones, ur: NEGATIVE mg/dL
pH: 6.5 (ref 5.0–8.0)

## 2012-03-10 LAB — CERULOPLASMIN: Ceruloplasmin: 32 mg/dL (ref 20–60)

## 2012-03-10 MED ORDER — PANTOPRAZOLE SODIUM 40 MG PO PACK
40.0000 mg | PACK | Freq: Every day | ORAL | Status: DC
  Administered 2012-03-11 – 2012-03-14 (×4): 40 mg via ORAL
  Filled 2012-03-10 (×6): qty 20

## 2012-03-10 MED ORDER — SODIUM CHLORIDE 0.9 % IV SOLN
INTRAVENOUS | Status: DC
  Administered 2012-03-10 – 2012-03-12 (×2): via INTRAVENOUS

## 2012-03-10 MED ORDER — RESOURCE THICKENUP CLEAR PO POWD
ORAL | Status: DC | PRN
  Filled 2012-03-10: qty 125

## 2012-03-10 NOTE — Progress Notes (Signed)
Patient continues to have a slowly waxing and waning confusion. Her weakness is unchanged.   She received compazine at 10:25 and it has made her quite sleepy.   Exam: Filed Vitals:   03/10/12 0456  BP: 127/83  Pulse: 103  Temp: 99.1 F (37.3 C)  Resp: 20   Gen: In bed MS: Asleep, awakens easily but remains very drowsy. Able to answer hospital, but not which. Follows commands briskly once awake.  Motor: 1/5 interossei, wrist extension, forearm pronation is 1/5. This is symmetric.   Impression: 61 yo F with progressive weakness and numbness of distal > proximal muscles and elevated CSF protein. This is most consistent with a guillian-barre variant. Her mental status I suspect to be a multifactorial delirium. Her current sleepiness is likely medication effect. We have started treatment with IVIG.   One other consideration is that she may have a paraneoplastic process, given her ocular mass, causing encephalopathy and peripheral neuropathy. I have asked that a paraneoplastic panel(must be comprehensive given multiple possible targets) be sent using blood drawn prior to her starting IVIG.   1) continue IVIG, today is day 2/5 2) Paraneoplastic panel sent from blood prior to IVIG

## 2012-03-10 NOTE — Progress Notes (Addendum)
SLP Note [phone]  SLP follow up to assess tolerance of diet.  Pt not alert but education completed with daughter.  SLP spoke to RN who reports pt tolerating po medications but having difficulties with throat clearing on liquids.  Note pt now getting IVIG for Guillain Barre syndrome which may contribute to her dysphagia.    Spoke to daughter Celine Mans who reports mother tolerating po but continues with poor intake.  She stated the pt "does fine" when she feeds her at slow rate.  Intake is already poor on a liberal diet and pt appears to be tolerating with all vital signs stable- therefore rec continue diet with strict asp precautions.    Educated daughter to need to feed pt only when she is fully alert, fully upright and accepting.  Further informed Celine Mans of  delirium being a risk factor for aspiration and that Rolena Infante may impact swallow therefore we need to monitor tolerance closely.  Daughter verbalized understanding for information provided.    Currently pt is sleeping with open mouth posture and was not appropriately alert for intake.  RN reports emesis episode earlier after which pt received medication that has made her lethargic.  Will order a can of food thickener to use over the weekend if pt is having problems with liquids and thickener is helpful.  SLP to follow up Monday.    Donavan Burnet, MS [facility] SLP [phone]

## 2012-03-10 NOTE — Progress Notes (Signed)
PHARMACIST - PHYSICIAN COMMUNICATION CONCERNING:  IV to Oral Route Change Policy  RECOMMENDATION: This patient is receiving Protonix by the intravenous route.  Based on criteria approved by the Pharmacy and Therapeutics Committee,Protonix is being converted to the equivalent oral dose form(s).  DESCRIPTION: These criteria include:  Has a documented ability to take oral medications (tolerating diet of full liquids or better or  Gastric tube feedings for >24 hours OR taking other scheduled oral medications for >24 hours).  Expected plan for continued treatment for at least 1 day.  If you have questions about this conversion, please contact the Pharmacy Department  []   [phone] )  [facility] []   [phone] )  [facility]  []   [phone] )  [facility] [x]   [phone] )  [facility]    Will change to protonix suspension.  Other meds are crushed.   Geoffry Paradise, PharmD, BCPS Pager: [phone] 12:41 PM Pharmacy #: [phone]

## 2012-03-10 NOTE — Progress Notes (Signed)
Patient ID: Abigail Powell, female   DOB: [birth year], 61 y.o.   MRN: [id number]  [facility] PROGRESS NOTE  Abigail Powell WUJ:[id number] DOB: [birth year] DOA: 03/01/2012 PCP: Sheila Oats, MD  Brief narrative:  Pt is 61 year old female who is status post cholecystectomy 02/26/2012 and has required an admission 03/01/2012 for further evaluation of progressively worsening mental status associated with poor oral intake and intermittent episodes of watery diarrhea, exertional shortness of breath, and generalized weakness. 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 She has an appointment at [facility] with a ophthalmologist for biopsy. LP done 10/31 and the findings consistent with Guillain Barre syndrome, pt started on IVIG.  Active Problems:  Encephalopathy acute  - unclear what the exact etiology is at this time but possibly multifactorial and secondary to acute illness, alcohol use, progressive deconditioning and failure to thrive  - LP findings consistent with Guillain Barre syndrome - today is day #2 of IVIG - recommendation is to discharge pt to SNF for further rehabilitation  Protein calorie malnutrition, moderate  - SLP done and recommendation is to continue Dysphagia III diet  - encouraged PO intake  - albumin on admission 2  Leukocytosis with tachycardia - unclear etiology but slightly trending down - will check UA for now - will defer on CXR as pt is denying any respiratory symptoms Weakness, upper and lower extremity and bilateral  - likely secondary to Guillain Barre syndrome - IVIG as noted above - appreciate PT input  - will continue to follow up on neurologist recommendations  Hypertension  - stable  - will continue Norvasc and Avapro  Anemia of chronic disease  - Hg and Hct remain stable and at pt's baseline  - CBC In AM  Hyperlipidemia  - continue statin  Subclinical hyperthyroidism  - could also  contribute to progressive deconditioning  Hypokalemia  - secondary to diarrhea and vomiting which are now resolved  - also low magnesium level contributing but now resolved as well  - will continue to supplement if indicated  - BMP in AM  Transaminitis  - secondary to recent surgical manipulation  - HIDA scan with no biliary leak indicated  - LTF's trending down  - appreciate GI involvement  Alcohol abuse  - will address the medical issue once pt is more ready for discussion as right now she is still rather weak to participate in meaningful conversation  Quadriparesis  - contributing to progressive failure to thrive  - PT while inpatient  - LP results noted below  Consultants:  GI  Surgery  Neurology  Physical therapy   Procedures/Studies:  Mr Thoracic Spine Wo Contrast 03/09/2012  No evidence of cord infarct.   No edema is present in the spinal cord.   Disc degeneration and spondylosis on the right at C3-4.  Disc degeneration and spondylosis on the left at C4-5.   There is spondylosis at C5-6.  No cord compression.   03/09/2012  IMPRESSION:  Mild thoracic degenerative changes.   No cord infarct, mass, or cord compression is identified.    03/09/2012    MRI THORACIC SPINE WITHOUT CONTRAST    IMPRESSION:  Mild thoracic degenerative changes.  No cord infarct, mass, or cord compression is identified.  Dg Fluoro Guide Ndl Plc/bx 03/09/2012  IMPRESSION:  Successful lumbar puncture at L4-5 under fluoroscopic guidance, as described above.  No apparent complication.     Mr Cervical Spine Wo Contrast  03/08/2012  IMPRESSION:  The study is moderately degraded by motion artifact but I feel it is diagnostic.  1. Multilevel degenerative facet arthritis with multilevel slight foraminal stenosis as described above.  2. No disc protrusions or focal neural impingement.  3. Normal cervical spinal cord.   Antibiotics:  None  Code Status: Full  Family Communication: Pt and  daughter at bedside  Disposition Plan: SNF or transfer to tertiary medical center.    HPI/Subjective: No events overnight.   Objective: Filed Vitals:   03/09/12 2226 03/09/12 2305 03/10/12 0056 03/10/12 0456  BP: 135/88 134/83 142/86 127/83  Pulse: 118 120 110 103  Temp: 98.9 F (37.2 C) 99 F (37.2 C) 98.8 F (37.1 C) 99.1 F (37.3 C)  TempSrc: Oral Oral Oral Oral  Resp: 24 24 24 20   Height:      Weight:      SpO2: 100%  99% 100%    Intake/Output Summary (Last 24 hours) at 03/10/12 1335 Last data filed at 03/10/12 1159  Gross per 24 hour  Intake   1740 ml  Output   1875 ml  Net   -135 ml    Exam:   General:  Pt is somnolent, not vermal this AM  Cardiovascular: Regular rhythm, tachycardic, S1/S2, no murmurs, no rubs, no gallops  Respiratory: Clear to auscultation bilaterally, decreased breath sounds at bases  Abdomen: Soft, non tender, non distended, bowel sounds present, no guarding  Extremities: No edema, pulses DP and PT palpable bilaterally  Neuro: Somnolent and with bilateral upper and lower extremity weakness  Data Reviewed: Basic Metabolic Panel:  Lab 03/10/12 1610 03/09/12 0509 03/08/12 0445 03/07/12 0450 03/06/12 0440 03/04/12 0520  NA 133* 134* 134* 137 141 --  K 3.6 3.7 4.0 4.0 3.7 --  CL 101 102 103 104 110 --  CO2 21 20 21 23  18* --  GLUCOSE 128* 136* 100* 112* 114* --  BUN 6 6 5* 4* 3* --  CREATININE 0.54 0.58 0.60 0.57 0.55 --  CALCIUM 9.1 9.2 9.2 9.0 9.0 --  MG -- -- -- 2.3 1.0* 1.1*  PHOS -- -- -- -- -- 2.5   Liver Function Tests:  Lab 03/06/12 0440  AST 29  ALT 48*  ALKPHOS 169*  BILITOT 0.6  PROT 6.7  ALBUMIN 3.3*   CBC:  Lab 03/10/12 0447 03/09/12 0509  WBC 15.5* 16.6*  NEUTROABS -- --  HGB 11.9* 11.7*  HCT 35.8* 35.3*  MCV 91.6 91.0  PLT 502* 499*   Cardiac Enzymes:  Lab 03/09/12 1000  CKTOTAL 30  CKMB 2.2  CKMBINDEX --  TROPONINI --    Recent Results (from the past 240 hour(s))  CULTURE, BLOOD (ROUTINE X  2)     Status: Normal   Collection Time   03/01/12  9:05 PM      Component Value Range Status Comment   Specimen Description BLOOD RIGHT HAND   Final    Special Requests BOTTLES DRAWN AEROBIC ONLY   Final    Culture  Setup Time 03/02/2012 02:03   Final    Culture NO GROWTH 5 DAYS   Final    Report Status 03/08/2012 FINAL   Final   CULTURE, BLOOD (ROUTINE X 2)     Status: Normal   Collection Time   03/01/12  9:10 PM      Component Value Range Status Comment   Specimen Description BLOOD RIGHT ARM   Final    Special Requests BOTTLES DRAWN AEROBIC ONLY   Final  Culture  Setup Time 03/02/2012 02:03   Final    Culture NO GROWTH 5 DAYS   Final    Report Status 03/08/2012 FINAL   Final   CLOSTRIDIUM DIFFICILE BY PCR     Status: Normal   Collection Time   03/02/12  7:35 PM      Component Value Range Status Comment   C difficile by pcr NEGATIVE  NEGATIVE Final   URINE CULTURE     Status: Normal   Collection Time   03/03/12  1:34 PM      Component Value Range Status Comment   Specimen Description URINE, CATHETERIZED   Final    Special Requests NONE   Final    Culture  Setup Time 03/04/2012 01:12   Final    Colony Count NO GROWTH   Final    Culture NO GROWTH   Final    Report Status 03/04/2012 FINAL   Final      Scheduled Meds:   . amLODipine  5 mg Oral Daily   And  . irbesartan  150 mg Oral Daily  . feeding supplement  1 Container Oral TID BM  . Immune Globulin 5%  400 mg/kg Intravenous Q24 Hr x 5  . pantoprazole sodium  40 mg Oral Daily  . sodium chloride 0.9 % 50 mL with thiamine (B-1) 500 mg infusion   Intravenous Q24H  . sodium chloride  3 mL Intravenous Q12H  . DISCONTD: pantoprazole (PROTONIX) IV  40 mg Intravenous Q24H   Continuous Infusions:   . dextrose 5 % and 0.2 % NaCl with KCl 20 mEq 50 mL/hr at 03/09/12 1900     Debbora Presto, MD  [facility] Pager [phone]  If 7PM-7AM, please contact night-coverage [email or web address] Password [email or web address] 03/10/2012, 1:35  PM   LOS: 9 days

## 2012-03-11 ENCOUNTER — Inpatient Hospital Stay (HOSPITAL_COMMUNITY): Payer: BC Managed Care – HMO

## 2012-03-11 LAB — COMPREHENSIVE METABOLIC PANEL
Alkaline Phosphatase: 156 U/L — ABNORMAL HIGH (ref 39–117)
BUN: 6 mg/dL (ref 6–23)
CO2: 20 mEq/L (ref 19–32)
Chloride: 106 mEq/L (ref 96–112)
GFR calc Af Amer: >90 mL/min (ref >90)
Glucose, Bld: 118 mg/dL — ABNORMAL HIGH (ref 70–99)
Potassium: 3.6 mEq/L (ref 3.5–5.1)
Total Bilirubin: 0.6 mg/dL (ref 0.3–1.2)

## 2012-03-11 LAB — HIV ANTIBODY (ROUTINE TESTING W REFLEX): HIV: NONREACTIVE

## 2012-03-11 LAB — CBC
MCV: 90.9 fL (ref 78.0–100.0)
Platelets: 513 10*3/uL — ABNORMAL HIGH (ref 150–400)
RDW: 12.8 % (ref 11.5–15.5)
WBC: 13.8 10*3/uL — ABNORMAL HIGH (ref 4.0–10.5)

## 2012-03-11 LAB — STREP PNEUMONIAE URINARY ANTIGEN: Strep Pneumo Urinary Antigen: NEGATIVE

## 2012-03-11 LAB — HEAVY METALS, BLOOD
Arsenic: <3 mcg/L (ref <23)
Lead: 4 ug/dL (ref <10)
Mercury: <4 mcg/L (ref <=10)

## 2012-03-11 MED ORDER — LEVOFLOXACIN IN D5W 750 MG/150ML IV SOLN
750.0000 mg | INTRAVENOUS | Status: DC
  Administered 2012-03-11 – 2012-03-14 (×4): 750 mg via INTRAVENOUS
  Filled 2012-03-11 (×4): qty 150

## 2012-03-11 MED ORDER — VANCOMYCIN HCL 1000 MG IV SOLR
1250.0000 mg | Freq: Once | INTRAVENOUS | Status: AC
  Administered 2012-03-11: 1250 mg via INTRAVENOUS
  Filled 2012-03-11: qty 1250

## 2012-03-11 MED ORDER — DEXTROSE 5 % IV SOLN
1.0000 g | Freq: Three times a day (TID) | INTRAVENOUS | Status: DC
  Administered 2012-03-11 – 2012-03-13 (×6): 1 g via INTRAVENOUS
  Filled 2012-03-11 (×8): qty 1

## 2012-03-11 MED ORDER — SODIUM CHLORIDE 0.9 % IV SOLN
750.0000 mg | Freq: Two times a day (BID) | INTRAVENOUS | Status: DC
  Administered 2012-03-12 – 2012-03-13 (×4): 750 mg via INTRAVENOUS
  Filled 2012-03-11 (×5): qty 750

## 2012-03-11 MED ORDER — DEXTROSE 5 % IV SOLN
1.0000 g | INTRAVENOUS | Status: DC
  Administered 2012-03-11: 1 g via INTRAVENOUS
  Filled 2012-03-11: qty 10

## 2012-03-11 NOTE — Progress Notes (Signed)
ANTIBIOTIC CONSULT NOTE - INITIAL  Pharmacy Consult for vancomycin, levofloxacin, cefepime Indication: rule out pneumonia  Allergies  Allergen Reactions  . Asa (Aspirin) Other (See Comments)    Causes ulcer    Patient Measurements: Height: 5' 2.5" (158.8 cm) Weight: 117 lb 11.6 oz (53.4 kg) (standing scale) IBW/kg (Calculated) : 51.25  Adjusted Body Weight:   Vital Signs: Temp: 98.3 F (36.8 C) (11/02 0551) Temp src: Oral (11/02 0551) BP: 135/81 mmHg (11/02 0551) Pulse Rate: 108  (11/02 0551) Intake/Output from previous day: 11/01 0701 - 11/02 0700 In: 1121.7 [P.O.:480; I.V.:641.7] Out: 1350 [Urine:1350] Intake/Output from this shift: Total I/O In: 50 [IV Piggyback:50] Out: -   Labs:  Basename 03/11/12 0548 03/10/12 0447 03/09/12 0509  WBC 13.8* 15.5* 16.6*  HGB 11.1* 11.9* 11.7*  PLT 513* 502* 499*  LABCREA -- -- --  CREATININE 0.51 0.54 0.58   Estimated Creatinine Clearance: 59 ml/min (by C-G formula based on Cr of 0.51). No results found for this basename: VANCOTROUGH:2,VANCOPEAK:2,VANCORANDOM:2,GENTTROUGH:2,GENTPEAK:2,GENTRANDOM:2,TOBRATROUGH:2,TOBRAPEAK:2,TOBRARND:2,AMIKACINPEAK:2,AMIKACINTROU:2,AMIKACIN:2, in the last 72 hours   Microbiology: Recent Results (from the past 720 hour(s))  MRSA PCR SCREENING     Status: Normal   Collection Time   02/26/12 11:35 AM      Component Value Range Status Comment   MRSA by PCR NEGATIVE  NEGATIVE Final   CULTURE, BLOOD (ROUTINE X 2)     Status: Normal   Collection Time   03/01/12  9:05 PM      Component Value Range Status Comment   Specimen Description BLOOD RIGHT HAND   Final    Special Requests BOTTLES DRAWN AEROBIC ONLY   Final    Culture  Setup Time 03/02/2012 02:03   Final    Culture NO GROWTH 5 DAYS   Final    Report Status 03/08/2012 FINAL   Final   CULTURE, BLOOD (ROUTINE X 2)     Status: Normal   Collection Time   03/01/12  9:10 PM      Component Value Range Status Comment   Specimen Description  BLOOD RIGHT ARM   Final    Special Requests BOTTLES DRAWN AEROBIC ONLY   Final    Culture  Setup Time 03/02/2012 02:03   Final    Culture NO GROWTH 5 DAYS   Final    Report Status 03/08/2012 FINAL   Final   CLOSTRIDIUM DIFFICILE BY PCR     Status: Normal   Collection Time   03/02/12  7:35 PM      Component Value Range Status Comment   C difficile by pcr NEGATIVE  NEGATIVE Final   URINE CULTURE     Status: Normal   Collection Time   03/03/12  1:34 PM      Component Value Range Status Comment   Specimen Description URINE, CATHETERIZED   Final    Special Requests NONE   Final    Culture  Setup Time 03/04/2012 01:12   Final    Colony Count NO GROWTH   Final    Culture NO GROWTH   Final    Report Status 03/04/2012 FINAL   Final     Medical History: Past Medical History  Diagnosis Date  . Diverticulosis   . Hypertension   . 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 H/O gastric ulcer     Medications:  Scheduled:    . amLODipine  5 mg Oral Daily   And  .  irbesartan  150 mg Oral Daily  . feeding supplement  1 Container Oral TID BM  . Immune Globulin 5%  400 mg/kg Intravenous Q24 Hr x 5  . pantoprazole sodium  40 mg Oral Daily  . sodium chloride 0.9 % 50 mL with thiamine (B-1) 500 mg infusion   Intravenous Q24H  . sodium chloride  3 mL Intravenous Q12H  . DISCONTD: cefTRIAXone (ROCEPHIN)  IV  1 g Intravenous Q24H  . DISCONTD: pantoprazole (PROTONIX) IV  40 mg Intravenous Q24H   Assessment: 61 YOF admitted 10/23 for weakness and confusion following recent cholecystectomy (on 10/19).  Rocephin started this am for ? UTI, now changing to HCAP regimen for new infilrtrate on CXR this am.    Tmax:Afeb WBCs:13.8 (improving) Renal: Scr=0.51, CrCl (N) = 83  11/2 >>vanco >> 11/2 >>cefepime >>  11/2 >>levofloxacin >>   11/2 blood: 11/1 urine: 11/2 sputum:  Legionella Urinary Ag S. pneumo urinary Ag  Goal of Therapy:  Vancomycin trough level 15-20  mcg/ml  Plan:   vancomcin 1250mg  IV x 1 then 750mg  IV q12h  Cefepime 1gm IV q8h  Levofloxacin 750mg  IV q24h  F/u cultures and renal function check vancomycin trough if vanco to continue > 3 days.   Dannielle Huh 03/11/2012,10:10 AM

## 2012-03-11 NOTE — Progress Notes (Signed)
Patient's companion today feels that her confusion may be improving slightly, but she is still confused. No change in weakness.   Exam: Filed Vitals:   03/11/12 2058  BP: 141/75  Pulse: 113  Temp: 98.8 F (37.1 C)  Resp: 18   Gen: In bed MS: Awake, alert. Th[facility] [place] Motor: 1/5 interossei, apb, wrist extension, forearm pronation is 1/5. 4/5 biceps, 3/5 grip.    Impression: 61 yo F with progressive weakness and numbness of distal > proximal muscles and elevated CSF protein. Her pattern includes radial(wrist extensors), median(apb), ulnar(interossei),muscles innervated by C6,7(pronator teres) and C8-T1(more distal muscles). This pattern is most consistent with a length dependant process in the arms, though legs are also affected.  With the elevated protein, this is most consistent with a guillian-barre variant.   One other consideration is that she may have a paraneoplastic process, and a paraneoplastic panel has been sent  1) continue IVIG, today is day 3/5 2) Paraneoplastic panel sent from blood prior to IVIG is pending.

## 2012-03-11 NOTE — Progress Notes (Addendum)
Patient ID: Abigail Powell, female   DOB: August 30, 1950, 61 y.o.   MRN: [id number]  [facility] PROGRESS NOTE  Murphy Bundick WUJ:[id number] DOB: May 24, 1950 DOA: 03/01/2012 PCP: Sheila Oats, MD  Brief narrative:  Pt is 61 year old female who is status post cholecystectomy 02/26/2012 and has required an admission 03/01/2012 for further evaluation of progressively worsening mental status associated with poor oral intake and intermittent episodes of watery diarrhea, exertional shortness of breath, and generalized weakness. 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 She has an appointment at [facility] with a ophthalmologist for biopsy.  LP done 10/31 and the findings consistent with Guillain Barre syndrome, pt started on IVIG.   Active Problems:  Encephalopathy acute  - unclear what the exact etiology is at this time but possibly multifactorial and secondary to acute illness, alcohol use, progressive deconditioning and failure to thrive  - new UTI possibly contributing as well - CXR is suggestive of PNA and highly concerning for HCAP - will provide broad spectrum ABX and will plan on narrowing down as possible - LP findings consistent with Guillain Barre syndrome  - today is day #3 of IVIG  - recommendation is to discharge pt to SNF for further rehabilitation  Protein calorie malnutrition, moderate  - SLP done and recommendation is to continue Dysphagia III diet  - encouraged PO intake  - albumin on admission 2  Leukocytosis with tachycardia  - likely related to new UTI as UA on admission looked clear but now with leukocytes  - CXR suggestive of worsening opacity and possibility of PNA - will treat as HCAP for now PNA, ? HCAP - will cover broadly for now and place PNA focused order set UTI - as per new UA - urine culture pending - antibiotics as noted above Weakness, upper and lower extremity and bilateral  - likely secondary  to Guillain Barre syndrome  - IVIG as noted above  - appreciate PT input  - will continue to follow up on neurologist recommendations  Hypertension  - stable  - will continue Norvasc and Avapro  Anemia of chronic disease  - Hg and Hct remain stable and at pt's baseline  - CBC In AM  Hyperlipidemia  - continue statin  Subclinical hyperthyroidism  - could also contribute to progressive deconditioning  Hypokalemia  - secondary to diarrhea and vomiting which are now resolved  - also low magnesium level contributing but now resolved as well  - will continue to supplement if indicated  - BMP in AM  Transaminitis  - secondary to recent surgical manipulation  - HIDA scan with no biliary leak indicated  - LTF's trending down  - appreciate GI involvement  Alcohol abuse  - will address the medical issue once pt is more ready for discussion as right now she is still rather weak to participate in meaningful conversation  Quadriparesis  - contributing to progressive failure to thrive  - PT while inpatient  - LP results noted below   Consultants:  GI  Surgery  Neurology  Physical therapy   Procedures/Studies:  Mr Thoracic Spine Wo Contrast  03/09/2012  No evidence of cord infarct.  No edema is present in the spinal cord.  Disc degeneration and spondylosis on the right at C3-4.  Disc degeneration and spondylosis on the left at C4-5.  There is spondylosis at C5-6. No cord compression.   03/09/2012  IMPRESSION:  Mild thoracic degenerative changes.  No cord infarct,  mass, or cord compression is identified.   03/09/2012  MRI THORACIC SPINE WITHOUT CONTRAST  IMPRESSION:  Mild thoracic degenerative changes.  No cord infarct, mass, or cord compression is identified.   Dg Fluoro Guide Ndl Plc/bx  03/09/2012  IMPRESSION:  Successful lumbar puncture at L4-5 under fluoroscopic guidance, as described above.  No apparent complication.   Mr Cervical Spine Wo Contrast  03/08/2012    IMPRESSION:  The study is moderately degraded by motion artifact but I feel it is diagnostic.  1. Multilevel degenerative facet arthritis with multilevel slight foraminal stenosis as described above.  2. No disc protrusions or focal neural impingement.  3. Normal cervical spinal cord.   Antibiotics:   Vancomycin 11/02 -->  Maxipime 11/02 -->  Levaquin 11/02 -->  Code Status: Full  Family Communication: Pt and daughter at bedside  Disposition Plan: SNF  HPI/Subjective: No events overnight.   Objective: Filed Vitals:   03/10/12 2045 03/10/12 2115 03/10/12 2200 03/10/12 2330  BP: 131/82 136/86 132/88 140/86  Pulse: 98 117 119 107  Temp: 98.2 F (36.8 C) 98.6 F (37 C) 98 F (36.7 C) 97.9 F (36.6 C)  TempSrc: Oral Oral Axillary Axillary  Resp: 16 16 16 20   Height:      Weight:      SpO2: 100% 100% 100% 100%    Intake/Output Summary (Last 24 hours) at 03/11/12 0533 Last data filed at 03/11/12 0043  Gross per 24 hour  Intake    990 ml  Output   1100 ml  Net   -110 ml    Exam:   General:  Pt is still somnolent and nonverbal  Cardiovascular: Regular rhythm, tachycardic, S1/S2, no murmurs, no rubs, no gallops  Respiratory: Clear to auscultation bilaterally, no wheezing, no crackles, no rhonchi  Abdomen: Soft, non tender, non distended, bowel sounds present, no guarding  Extremities: No edema, pulses DP and PT palpable bilaterally  Neuro:Somnolent and not following any commands, not moving any extremity for me this AM  Data Reviewed: Basic Metabolic Panel: BMET    Component Value Date/Time   NA 135 03/11/2012 0548   K 3.6 03/11/2012 0548   CL 106 03/11/2012 0548   CO2 20 03/11/2012 0548   GLUCOSE 118* 03/11/2012 0548   BUN 6 03/11/2012 0548   CREATININE 0.51 03/11/2012 0548   CALCIUM 9.2 03/11/2012 0548   GFRNONAA >90 03/11/2012 0548   GFRAA >90 03/11/2012 0548      Lab 03/10/12 0447 03/09/12 0509 03/08/12 0445 03/07/12 0450 03/06/12 0440  NA 133* 134*  134* 137 141  K 3.6 3.7 4.0 4.0 3.7  CL 101 102 103 104 110  CO2 21 20 21 23  18*  GLUCOSE 128* 136* 100* 112* 114*  BUN 6 6 5* 4* 3*  CREATININE 0.54 0.58 0.60 0.57 0.55  CALCIUM 9.1 9.2 9.2 9.0 9.0  MG -- -- -- 2.3 1.0*  PHOS -- -- -- -- --   Liver Function Tests:  Lab 03/06/12 0440  AST 29  ALT 48*  ALKPHOS 169*  BILITOT 0.6  PROT 6.7  ALBUMIN 3.3*   CBC    Component Value Date/Time   WBC 13.8* 03/11/2012 0548   HGB 11.1* 03/11/2012 0548   PLT 513* 03/11/2012 0548   CBC:  Lab 03/10/12 0447 03/09/12 0509  WBC 15.5* 16.6*  NEUTROABS -- --  HGB 11.9* 11.7*  HCT 35.8* 35.3*  MCV 91.6 91.0  PLT 502* 499*   Cardiac Enzymes:  Lab 03/09/12 1000  CKTOTAL 30  CKMB 2.2  CKMBINDEX --  TROPONINI --     Recent Results (from the past 240 hour(s))  CULTURE, BLOOD (ROUTINE X 2)     Status: Normal   Collection Time   03/01/12  9:05 PM      Component Value Range Status Comment   Specimen Description BLOOD RIGHT HAND   Final    Special Requests BOTTLES DRAWN AEROBIC ONLY   Final    Culture  Setup Time 03/02/2012 02:03   Final    Culture NO GROWTH 5 DAYS   Final    Report Status 03/08/2012 FINAL   Final   CULTURE, BLOOD (ROUTINE X 2)     Status: Normal   Collection Time   03/01/12  9:10 PM      Component Value Range Status Comment   Specimen Description BLOOD RIGHT ARM   Final    Special Requests BOTTLES DRAWN AEROBIC ONLY   Final    Culture  Setup Time 03/02/2012 02:03   Final    Culture NO GROWTH 5 DAYS   Final    Report Status 03/08/2012 FINAL   Final   CLOSTRIDIUM DIFFICILE BY PCR     Status: Normal   Collection Time   03/02/12  7:35 PM      Component Value Range Status Comment   C difficile by pcr NEGATIVE  NEGATIVE Final   URINE CULTURE     Status: Normal   Collection Time   03/03/12  1:34 PM      Component Value Range Status Comment   Specimen Description URINE, CATHETERIZED   Final    Special Requests NONE   Final    Culture  Setup Time  03/04/2012 01:12   Final    Colony Count NO GROWTH   Final    Culture NO GROWTH   Final    Report Status 03/04/2012 FINAL   Final      Scheduled Meds:   . amLODipine  5 mg Oral Daily   And  . irbesartan  150 mg Oral Daily  . feeding supplement  1 Container Oral TID BM  . Immune Globulin 5%  400 mg/kg Intravenous Q24 Hr x 5  . pantoprazole sodium  40 mg Oral Daily  . sodium chloride 0.9 % 50 mL with thiamine (B-1) 500 mg infusion   Intravenous Q24H  . sodium chloride  3 mL Intravenous Q12H  . DISCONTD: pantoprazole (PROTONIX) IV  40 mg Intravenous Q24H   Continuous Infusions:   . sodium chloride 50 mL/hr at 03/10/12 1532  . DISCONTD: dextrose 5 % and 0.2 % NaCl with KCl 20 mEq 50 mL/hr at 03/09/12 1900     Debbora Presto, MD  [facility] Pager [phone]  If 7PM-7AM, please contact night-coverage [email or web address] Password [email or web address] 03/11/2012, 5:33 AM   LOS: 10 days    \

## 2012-03-12 ENCOUNTER — Inpatient Hospital Stay (HOSPITAL_COMMUNITY): Payer: BC Managed Care – HMO

## 2012-03-12 LAB — CBC
Hemoglobin: 10.5 g/dL — ABNORMAL LOW (ref 12.0–15.0)
RBC: 3.5 MIL/uL — ABNORMAL LOW (ref 3.87–5.11)

## 2012-03-12 LAB — BASIC METABOLIC PANEL
CO2: 19 mEq/L (ref 19–32)
Glucose, Bld: 117 mg/dL — ABNORMAL HIGH (ref 70–99)
Potassium: 3.2 mEq/L — ABNORMAL LOW (ref 3.5–5.1)
Sodium: 136 mEq/L (ref 135–145)

## 2012-03-12 LAB — LEGIONELLA ANTIGEN, URINE

## 2012-03-12 MED ORDER — METOPROLOL SUCCINATE ER 50 MG PO TB24
50.0000 mg | ORAL_TABLET | Freq: Every day | ORAL | Status: DC
  Administered 2012-03-12 – 2012-03-13 (×2): 50 mg via ORAL
  Filled 2012-03-12 (×3): qty 1

## 2012-03-12 MED ORDER — SODIUM CHLORIDE 0.9 % IJ SOLN
10.0000 mL | Freq: Two times a day (BID) | INTRAMUSCULAR | Status: DC
  Administered 2012-03-12 (×2): 10 mL

## 2012-03-12 MED ORDER — SODIUM CHLORIDE 0.9 % IJ SOLN
10.0000 mL | INTRAMUSCULAR | Status: DC | PRN
  Administered 2012-03-14: 10 mL

## 2012-03-12 NOTE — Progress Notes (Signed)
Peripherally Inserted Central Catheter/Midline Placement  The IV Nurse has discussed with the patient and/or persons authorized to consent for the patient, the purpose of this procedure and the potential benefits and risks involved with this procedure.  The benefits include less needle sticks, lab draws from the catheter and patient may be discharged home with the catheter.  Risks include, but not limited to, infection, bleeding, blood clot (thrombus formation), and puncture of an artery; nerve damage and irregular heat beat.  Alternatives to this procedure were also discussed.  PICC/Midline Placement Documentation  PICC / Midline Double Lumen 03/12/12 PICC Right Basilic (Active)       Ethelda Chick 03/12/2012, 2:36 PM

## 2012-03-12 NOTE — Progress Notes (Signed)
Patient experienced a bout of nausea at this time. As her appetite has been poor, her emesis ws mostly bile and mucous-y in nature. I gave her Compazine 10mg  IV. Her nausea subsided and she fell asleep shortly thereafter. Patient is resting well presently. I will continue to monitor for any further episodes of nausea.

## 2012-03-12 NOTE — Progress Notes (Signed)
Patient ID: Abigail Powell, female   DOB: 1951-04-16, 61 y.o.   MRN: [id number]  [facility] PROGRESS NOTE  Abigail Powell WUJ:[id number] DOB: March 30, 1951 DOA: 03/01/2012 PCP: Sheila Oats, MD  Brief narrative:  Pt is 61 year old female who is status post cholecystectomy 02/26/2012 and has required an admission 03/01/2012 for further evaluation of progressively worsening mental status associated with poor oral intake and intermittent episodes of watery diarrhea, exertional shortness of breath, and generalized weakness. 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 She has an appointment at [facility] with a ophthalmologist for biopsy.  LP done 10/31 and the findings consistent with Guillain Barre syndrome, pt started on IVIG.   Active Problems:  Encephalopathy acute  - multifactorial and secondary to acute illness, alcohol use, progressive deconditioning and failure to thrive, now new developing PNA and UTI - pt is clinically improving - CXR is suggestive of PNA and highly concerning for HCAP  - continue broad spectrum ABX and will plan on narrowing down in AM as pt is clinically improving  - LP findings consistent with Guillain Barre syndrome  - today is day #4 of IVIG  - recommendation is to discharge pt to SNF for further rehabilitation  Protein calorie malnutrition, moderate  - SLP done and recommendation is to continue Dysphagia III diet  - encouraged PO intake  - albumin on admission 2  Leukocytosis with tachycardia  - likely related to new UTI as UA on admission looked clear but now with leukocytes  - WBC on CBC is trending down - CXR suggestive of worsening opacity and possibility of PNA  - will treat as HCAP for now  - continue broad spectrum antibiotics Vancomycin, Levaquin, Maxipime, which should cover pneumonia and UTI PNA, ? HCAP  - continue antibiotics as noted above - pt is clinically improving and maintaining  oxygen saturations at target range  UTI  - as per new UA  - urine culture pending  - antibiotics as noted above  Weakness, upper and lower extremity and bilateral  - likely secondary to Guillain Barre syndrome  - IVIG as noted above  - appreciate PT input  - will continue to follow up on neurologist recommendations  Hypertension  - stable  - will continue Norvasc and Avapro  Anemia of chronic disease  - Hg and Hct remain stable and at pt's baseline  - CBC In AM  Hyperlipidemia  - continue statin  Subclinical hyperthyroidism  - could also contribute to progressive deconditioning  Hypokalemia  - secondary to diarrhea and vomiting  - mild drop in K since yesterday - will supplement today - check Mg level Transaminitis  - secondary to recent surgical manipulation  - HIDA scan with no biliary leak indicated  - LTF's trending down  - appreciate GI involvement  Alcohol abuse  - will address the medical issue once pt is more ready for discussion as right now she is still rather weak to participate in meaningful conversation  Quadriparesis  - contributing to progressive failure to thrive  - PT while inpatient  - LP results noted below   Consultants:  GI  Surgery  Neurology  Physical therapy   Procedures/Studies:  Dg Chest Port 1 View 03/11/2012   Slight worsening patchy left lower lobe airspace process or pneumonia.    Mr Thoracic Spine Wo Contrast  03/09/2012  No evidence of cord infarct.  No edema is present in the spinal cord.  Disc degeneration  and spondylosis on the right at C3-4.  Disc degeneration and spondylosis on the left at C4-5.  There is spondylosis at C5-6. No cord compression.   Dg Fluoro Guide Ndl Plc/bx  03/09/2012  Successful lumbar puncture at L4-5 under fluoroscopic guidance, as described above.  No apparent complication.   Mr Cervical Spine Wo Contrast  03/08/2012  1. Multilevel degenerative facet arthritis with multilevel slight foraminal  stenosis as described above.  2. No disc protrusions or focal neural impingement.  3. Normal cervical spinal cord.   Antibiotics:  Vancomycin 11/02 -->  Maxipime 11/02 -->  Levaquin 11/02 -->  Code Status: Full Family Communication: Pt at bedside Disposition Plan: SNF  HPI/Subjective: No events overnight.   Objective: Filed Vitals:   03/11/12 1507 03/11/12 2058 03/12/12 0650 03/12/12 1300  BP: 126/72 141/75 128/82 139/86  Pulse: 113 113 108 118  Temp: 97.9 F (36.6 C) 98.8 F (37.1 C) 98.4 F (36.9 C) 98.8 F (37.1 C)  TempSrc: Oral Oral Oral Oral  Resp: 22 18 16 20   Height:      Weight:      SpO2: 99% 99% 99% 98%    Intake/Output Summary (Last 24 hours) at 03/12/12 1449 Last data filed at 03/12/12 1300  Gross per 24 hour  Intake    540 ml  Output   2125 ml  Net  -1585 ml    Exam:   General:  Pt is more alert, follows commands appropriately, not in acute distress  Cardiovascular: Regular rhythm, tachycardic, S1/S2, no murmurs, no rubs, no gallops  Respiratory: Clear to auscultation bilaterally, bibasilar rales  Abdomen: Soft, non tender, non distended, bowel sounds present, no guarding  Extremities: No edema, pulses DP and PT palpable bilaterally  Data Reviewed: Basic Metabolic Panel: Lab 03/12/12 0528 03/11/12 0548 03/10/12 0447 03/09/12 0509 03/08/12 0445 03/07/12 0450 03/06/12 0440  NA 136 135 133* 134* 134* -- --  K 3.2* 3.6 3.6 3.7 4.0 -- --  CL 105 106 101 102 103 -- --  CO2 19 20 21 20 21  -- --  GLUCOSE 117* 118* 128* 136* 100* -- --  BUN 6 6 6 6  5* -- --  CREATININE 0.50 0.51 0.54 0.58 0.60 -- --  CALCIUM 9.4 9.2 9.1 9.2 9.2 -- --  MG -- -- -- -- -- 2.3 1.0*  PHOS -- -- -- -- -- -- --   Liver Function Tests: Lab 03/11/12 0548 03/06/12 0440  AST 35 29  ALT 44* 48*  ALKPHOS 156* 169*  BILITOT 0.6 0.6  PROT 7.4 6.7  ALBUMIN 2.6* 3.3*   CBC: Lab 03/12/12 0528 03/11/12 0548 03/10/12 0447 03/09/12 0509  WBC 11.9* 13.8* 15.5* 16.6*  HGB  10.5* 11.1* 11.9* 11.7*  HCT 32.0* 33.8* 35.8* 35.3*  MCV 91.4 90.9 91.6 91.0  PLT 538* 513* 502* 499*   Cardiac Enzymes: Lab 03/09/12 1000  CKTOTAL 30  CKMB 2.2  CKMBINDEX --  TROPONINI --   Recent Results (from the past 240 hour(s))  CLOSTRIDIUM DIFFICILE BY PCR     Status: Normal   Collection Time   03/02/12  7:35 PM      Component Value Range Status Comment   C difficile by pcr NEGATIVE  NEGATIVE Final   URINE CULTURE     Status: Normal   Collection Time   03/03/12  1:34 PM      Component Value Range Status Comment   Specimen Description URINE, CATHETERIZED   Final    Special Requests NONE  Final    Culture  Setup Time 03/04/2012 01:12   Final    Colony Count NO GROWTH   Final    Culture NO GROWTH   Final    Report Status 03/04/2012 FINAL   Final      Scheduled Meds:   . amLODipine  5 mg Oral Daily  . irbesartan  150 mg Oral Daily  . ceFEPime (MAXIPIME) IV  1 g Intravenous Q8H  . feeding supplement  1 Container Oral TID BM  . Immune Globulin 5%  400 mg/kg Intravenous Q24 Hr x 5  . levofloxacin IV  750 mg Intravenous Q24H  . pantoprazole sodium  40 mg Oral Daily  . NaCl 0.9 % 50 mL+ thiamine 500 mg    Intravenous Q24H  . vancomycin  750 mg Intravenous Q12H   Continuous Infusions:   . sodium chloride 50 mL/hr at 03/10/12 1532     Debbora Presto, MD  [facility] Pager [phone]  If 7PM-7AM, please contact night-coverage [email or web address] Password [email or web address] 03/12/2012, 2:49 PM   LOS: 11 days

## 2012-03-13 LAB — CBC
Hemoglobin: 10.1 g/dL — ABNORMAL LOW (ref 12.0–15.0)
MCH: 30.4 pg (ref 26.0–34.0)
MCV: 90.7 fL (ref 78.0–100.0)
RBC: 3.32 MIL/uL — ABNORMAL LOW (ref 3.87–5.11)

## 2012-03-13 LAB — BASIC METABOLIC PANEL
CO2: 20 mEq/L (ref 19–32)
Calcium: 9.2 mg/dL (ref 8.4–10.5)
Creatinine, Ser: 0.52 mg/dL (ref 0.50–1.10)
Glucose, Bld: 111 mg/dL — ABNORMAL HIGH (ref 70–99)

## 2012-03-13 MED ORDER — POTASSIUM CHLORIDE CRYS ER 20 MEQ PO TBCR
40.0000 meq | EXTENDED_RELEASE_TABLET | Freq: Two times a day (BID) | ORAL | Status: DC
  Administered 2012-03-13: 40 meq via ORAL
  Filled 2012-03-13 (×2): qty 2

## 2012-03-13 MED ORDER — METOPROLOL SUCCINATE ER 100 MG PO TB24
100.0000 mg | ORAL_TABLET | Freq: Every day | ORAL | Status: DC
  Administered 2012-03-14: 100 mg via ORAL
  Filled 2012-03-13: qty 1

## 2012-03-13 MED ORDER — POTASSIUM CHLORIDE 10 MEQ/100ML IV SOLN
10.0000 meq | INTRAVENOUS | Status: AC
  Administered 2012-03-13 – 2012-03-14 (×6): 10 meq via INTRAVENOUS
  Filled 2012-03-13 (×9): qty 100

## 2012-03-13 NOTE — Progress Notes (Signed)
Physical Therapy Treatment Patient Details Name: Abigail Powell MRN: [id number] DOB: [birth year] Today's Date: 03/13/2012 Time: 4782-9562 PT Time Calculation (min): 27 min  PT Assessment / Plan / Recommendation Comments on Treatment Session  Pt seems to be getting progressively weaker.  She is still able to assist with getting to EOB, however was unable to stand today and was helped to the chair via squat pivot transfer.  Pt also states that she is very dizzy most of session.  BP WNL.  Encouraged pt to stay in chair x 1-2 hours.     Follow Up Recommendations  Post acute inpatient     Does the patient have the potential to tolerate intense rehabilitation  No, Recommend SNF  Barriers to Discharge        Equipment Recommendations  Rolling walker with 5" wheels;3 in 1 bedside comode;Tub/shower seat    Recommendations for Other Services    Frequency Min 3X/week   Plan Discharge plan remains appropriate    Precautions / Restrictions Precautions Precautions: Fall Precaution Comments: Pt very ataxic with B knee buckling when WB Restrictions Weight Bearing Restrictions: No   Pertinent Vitals/Pain No pain, very dizzy with being upright.     Mobility  Bed Mobility Bed Mobility: Supine to Sit Supine to Sit: 1: +2 Total assist;HOB elevated Supine to Sit: Patient Percentage: 40% Details for Bed Mobility Assistance: Pt able to move LEs to EOB and somewhat off EOB, however requires assist for LEs and trunk due to increased weakness in UEs to get to EOB.  Provided cues for pt to self assist and to ensure safety of hands when sitting on EOB.  Transfers Transfers: Sit to Stand;Stand to Sit;Squat Pivot Transfers Sit to Stand: 1: +2 Total assist;From bed;From elevated surface Sit to Stand: Patient Percentage: 10% Stand to Sit: 1: +2 Total assist;To bed Stand to Sit: Patient Percentage: 0% Squat Pivot Transfers: 1: +1 Total assist Details for Transfer Assistance: Attempted sit to stand x 2 reps,  however pt with increased weakness and inability to WB, therefore performed squat pivot to assist pt to chair.   Ambulation/Gait Ambulation/Gait Assistance: Not tested (comment)    Exercises General Exercises - Lower Extremity Ankle Circles/Pumps: AROM;Both;20 reps Quad Sets: Both;5 reps;AROM Heel Slides: AAROM;Both;5 reps   PT Diagnosis:    PT Problem List:   PT Treatment Interventions:     PT Goals Acute Rehab PT Goals PT Goal Formulation: With patient/family Time For Goal Achievement: 03/17/12 Potential to Achieve Goals: Fair Pt will go Supine/Side to Sit: with min assist PT Goal: Supine/Side to Sit - Progress: Progressing toward goal Pt will go Sit to Stand: with min assist PT Goal: Sit to Stand - Progress: Not progressing Pt will Ambulate: 1 - 15 feet;with min assist;with least restrictive assistive device PT Goal: Ambulate - Progress: Not progressing  Visit Information  Last PT Received On: 03/13/12 Assistance Needed: +2    Subjective Data  Subjective: I'm so dizzy Patient Stated Goal: none stated   Cognition  Overall Cognitive Status: History of cognitive impairments - at baseline Arousal/Alertness: Lethargic Orientation Level: Appears intact for tasks assessed Behavior During Session: [facility] for tasks performed    Balance     End of Session PT - End of Session Equipment Utilized During Treatment: Gait belt Activity Tolerance: Treatment limited secondary to medical complications (Comment) (Pt very dizzy) Patient left: in chair;with call bell/phone within reach Nurse Communication: Mobility status;Need for lift equipment   GP     Page, Meribeth Mattes  03/13/2012, 2:49 PM

## 2012-03-13 NOTE — Progress Notes (Signed)
Patient ID: Abigail Powell, female   DOB: 1951/02/14, 61 y.o.   MRN: [id number] [facility] PROGRESS NOTE  Abigail Powell WUJ:[id number] DOB: [birth year] DOA: 03/01/2012 PCP: Sheila Oats, MD  Brief narrative:  Pt is 61 year old female who is status post cholecystectomy 02/26/2012 and has required an admission 03/01/2012 for further evaluation of progressively worsening mental status associated with poor oral intake and intermittent episodes of watery diarrhea, exertional shortness of breath, and generalized weakness. 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 She has an appointment at [facility] with a ophthalmologist for biopsy.  LP done 10/31 and the findings consistent with Guillain Barre syndrome, pt started on IVIG.   Active Problems:  Encephalopathy acute  - multifactorial and secondary to acute illness, alcohol use, progressive deconditioning and failure to thrive, now new developing PNA and UTI  - CXR is suggestive of PNA and highly concerning for HCAP  - narrowing down antibiotics as pr is clinically improving  - LP findings consistent with Guillain Barre syndrome  - today is day #5 of IVIG  - recommendation is to discharge pt to SNF for further rehabilitation  Protein calorie malnutrition, moderate  - SLP done and recommendation is to continue Dysphagia III diet  - encouraged PO intake  - albumin on admission 2  Leukocytosis with tachycardia  - likely related to new UTI as UA on admission looked clear but now with leukocytes  - WBC on CBC is trending down  - CXR suggestive of worsening opacity and possibility of PNA  - will treat as HCAP for now and will narrow antibiotics to Levaquin only PNA, ? HCAP  - continue antibiotic as noted above  - pt is clinically improving and maintaining oxygen saturations at target range  UTI  - as per new UA  - urine culture pending  - antibiotic as noted above  Weakness, upper and  lower extremity and bilateral  - likely secondary to Guillain Barre syndrome  - IVIG as noted above  - appreciate PT input  - will continue to follow up on neurologist recommendations  Hypertension  - stable  - will continue Norvasc and Avapro  Anemia of chronic disease  - Hg and Hct remain stable and at pt's baseline  - CBC In AM  Hyperlipidemia  - continue statin  Subclinical hyperthyroidism  - could also contribute to progressive deconditioning  Hypokalemia  - secondary to diarrhea and vomiting  - mild drop in K since yesterday  - will supplement today  Transaminitis  - secondary to recent surgical manipulation  - HIDA scan with no biliary leak indicated  - LTF's trending down  - appreciate GI involvement  Alcohol abuse  - will address the medical issue once pt is more ready for discussion as right now she is still rather weak to participate in meaningful conversation  Quadriparesis  - contributing to progressive failure to thrive  - PT while inpatient  - LP results noted below   Consultants:  GI  Surgery  Neurology  Physical therapy   Procedures/Studies:  Dg Chest Port 1 View  03/11/2012  Slight worsening patchy left lower lobe airspace process or pneumonia.  Mr Thoracic Spine Wo Contrast  03/09/2012  No evidence of cord infarct.  No edema is present in the spinal cord.  Disc degeneration and spondylosis on the right at C3-4.  Disc degeneration and spondylosis on the left at C4-5.  There is spondylosis at C5-6. No  cord compression.  Dg Fluoro Guide Ndl Plc/bx  03/09/2012  Successful lumbar puncture at L4-5 under fluoroscopic guidance, as described above.  No apparent complication.  Mr Cervical Spine Wo Contrast  03/08/2012  1. Multilevel degenerative facet arthritis with multilevel slight foraminal stenosis as described above.  2. No disc protrusions or focal neural impingement.  3. Normal cervical spinal cord.   Antibiotics:  Vancomycin 11/02 -->  11/04 Maxipime 11/02 --> 11/04 Levaquin 11/02 -->  Code Status: Full  Family Communication: Pt at bedside  Disposition Plan: SNF in AM  HPI/Subjective: No events overnight.   Objective: Filed Vitals:   03/12/12 2030 03/12/12 2130 03/12/12 2250 03/13/12 0617  BP: 133/91 133/90 133/87 139/82  Pulse: 101 100 95 94  Temp: 98.2 F (36.8 C) 99 F (37.2 C) 99 F (37.2 C) 98.6 F (37 C)  TempSrc: Oral Oral Oral Oral  Resp: 18 18 18 16   Height:      Weight:      SpO2: 100%  100% 100%    Intake/Output Summary (Last 24 hours) at 03/13/12 1341 Last data filed at 03/13/12 0930  Gross per 24 hour  Intake 5775.26 ml  Output   1600 ml  Net 4175.26 ml    Exam:   General:  Pt is alert, follows commands appropriately, not in acute distress  Cardiovascular: Regular rate and rhythm, S1/S2, no murmurs, no rubs, no gallops  Respiratory: Clear to auscultation bilaterally, no wheezing, no crackles, no rhonchi  Abdomen: Soft, non tender, non distended, bowel sounds present, no guarding  Extremities: No edema, pulses DP and PT palpable bilaterally  Data Reviewed: Basic Metabolic Panel:  Lab 03/13/12 5784 03/12/12 0528 03/11/12 0548 03/10/12 0447 03/09/12 0509 03/07/12 0450  NA 134* 136 135 133* 134* --  K 3.0* 3.2* 3.6 3.6 3.7 --  CL 105 105 106 101 102 --  CO2 20 19 20 21 20  --  GLUCOSE 111* 117* 118* 128* 136* --  BUN 6 6 6 6 6  --  CREATININE 0.52 0.50 0.51 0.54 0.58 --  CALCIUM 9.2 9.4 9.2 9.1 9.2 --  MG -- -- -- -- -- 2.3  PHOS -- -- -- -- -- --   Liver Function Tests:  Lab 03/11/12 0548  AST 35  ALT 44*  ALKPHOS 156*  BILITOT 0.6  PROT 7.4  ALBUMIN 2.6*   CBC:  Lab 03/13/12 0500 03/12/12 0528 03/11/12 0548 03/10/12 0447 03/09/12 0509  WBC 12.8* 11.9* 13.8* 15.5* 16.6*  NEUTROABS -- -- -- -- --  HGB 10.1* 10.5* 11.1* 11.9* 11.7*  HCT 30.1* 32.0* 33.8* 35.8* 35.3*  MCV 90.7 91.4 90.9 91.6 91.0  PLT 525* 538* 513* 502* 499*   Cardiac Enzymes:  Lab 03/09/12  1000  CKTOTAL 30  CKMB 2.2  CKMBINDEX --  TROPONINI --     Recent Results (from the past 240 hour(s))  URINE CULTURE     Status: Normal (Preliminary result)   Collection Time   03/10/12  4:16 PM      Component Value Range Status Comment   Specimen Description URINE, CATHETERIZED   Final    Special Requests NONE   Final    Culture  Setup Time 03/11/2012 02:21   Final    Colony Count 85,000 COLONIES/ML   Final    Culture ENTEROCOCCUS SPECIES   Final    Report Status PENDING   Incomplete      Scheduled Meds:   . amLODipine  5 mg Oral Daily  . irbesartan  150 mg Oral Daily  . ceFEPime (MAXIPIME) IV  1 g Intravenous Q8H  . feeding supplement  1 Container Oral TID BM  . Immune Globulin 5%  400 mg/kg Intravenous Q24 Hr x 5  . levofloxacin  IV  750 mg Intravenous Q24H  . metoprolol succinate  50 mg Oral Daily  . pantoprazole sodium  40 mg Oral Daily  . [COMPLETED] sodium chloride 0.9 % 50 mL with thiamine (B-1) 500 mg infusion   Intravenous Q24H  . vancomycin  750 mg Intravenous Q12H   Continuous Infusions:   . sodium chloride 50 mL/hr at 03/12/12 1540     Debbora Presto, MD  [facility] Pager [phone]  If 7PM-7AM, please contact night-coverage [email or web address] Password [email or web address] 03/13/2012, 1:41 PM   LOS: 12 days

## 2012-03-13 NOTE — Progress Notes (Signed)
Patient's companion today feels she is more awake than when she first came in. No change in strength.   Exam: Filed Vitals:   03/13/12 0617  BP: 139/82  Pulse: 94  Temp: 98.6 F (37 C)  Resp: 16   Gen: In bed MS: Awake, alert. Thinks she is in [facility] in [place].  Motor: 1/5 interossei, apb, wrist extension, forearm pronation is 1/5. 4/5 biceps, 4-/5 triceps 3/5 grip.    Impression: 61 yo F with progressive weakness and numbness of distal > proximal muscles and elevated CSF protein. Her pattern includes radial(wrist extensors), median(apb), ulnar(interossei),muscles innervated by C6,7(pronator teres) and C8-T1(more distal muscles). She has relatively preserved triceps(C7,8) and biceps(C5,6). This pattern is most consistent with a length dependant process in the arms, though legs are also affected.  With the elevated protein, this is most consistent with a guillian-barre variant.   One other consideration is that she may have a paraneoplastic process, and a paraneoplastic panel has been sent. I suspect that her AMS represents a delirium in the setting of acute illness and would expect it to improve. She may have some mild baseline memory difficulty, but only recently moved in with her daughter and therefore this is difficult to determine.   1) continue IVIG, today is day 5/5 2) Paraneoplastic panel sent from blood prior to IVIG is pending.  3) Ok to progress to rehab from neurological point of view. She has been stable and once she has finished IVIG will be done with her treatment.  4) Would arrange outpatient neurology follow up. Would need EMG if any further symptoms arise or if she is not improving.   Ritta Slot, MD [facility] [phone]  If 7pm- 7am, please page neurology on call at [phone].

## 2012-03-13 NOTE — Consult Note (Signed)
Patient Identification:  Abigail Powell Date of Evaluation:  03/13/2012 Reason for Consult: Pending for 03/14/12  capcity  Referring Provider: Dr. Izola Price  History of Present Illness Pt admitted for multiple medical problems: diarrhea, exertional shortness of breath, generalized weakness and undiagnosed tumor behind L eye.  Appt for biopsy pending at [facility].  LP and findings consistent with Guillian Barre Syndrome.  Pt was started on IV IG.  She is stable and pt is being transferred to SNF.   Past Psychiatric History not provided   Past Medical History:     Past Medical History  Diagnosis Date  . Diverticulosis   . Hypertension   . 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 H/O gastric ulcer        Past Surgical History  Procedure Date  . Cholecystectomy 02/26/2012    Procedure: LAPAROSCOPIC CHOLECYSTECTOMY WITH INTRAOPERATIVE CHOLANGIOGRAM;  Surgeon: Lodema Pilot, DO;  Location: [facility] ORS;  Service: General;  Laterality: N/A;  . Abdominal hysterectomy   . Orthoscopic knee surgery   . Abdominal hysterectomy     Allergies:  Allergies  Allergen Reactions  . Asa (Aspirin) Other (See Comments)    Causes ulcer    Current Medications:  Prior to Admission medications   Medication Sig Start Date End Date Taking? Authorizing Provider  amLODipine-olmesartan (AZOR) 5-20 MG per tablet Take 1 tablet by mouth daily.   Yes Historical Provider, MD  Ascorbic Acid (VITAMIN C PO) Take 1 tablet by mouth daily.   Yes Historical Provider, MD  cephALEXin (KEFLEX) 500 MG capsule Take 500 mg by mouth 4 (four) times daily. 01/30/12  Yes Tatyana A Kirichenko, PA  Cholecalciferol (VITAMIN D PO) Take 1 tablet by mouth daily.   Yes Historical Provider, MD  fish oil-omega-3 fatty acids 1000 MG capsule Take 1 g by mouth daily.   Yes Historical Provider, MD  gemfibrozil (LOPID) 600 MG tablet Take 600 mg by mouth 2 (two) times daily before a meal.   Yes Historical Provider, MD    HYDROcodone-acetaminophen (NORCO/VICODIN) 5-325 MG per tablet Take 1-2 tablets by mouth every 4 (four) hours as needed. Pain 02/28/12  Yes Letha Cape, PA  VITAMIN E PO Take 1 capsule by mouth daily.   Yes Historical Provider, MD    Social History:    reports that she has never smoked. She has never used smokeless tobacco. She reports that she drinks alcohol. She reports that she does not use illicit drugs.   Family History:    Family History  Problem Relation Age of Onset  . Ovarian cancer    . Prostate cancer    . Hypertension      Mental Status Examination/Evaluation: Pt presented with full orientation and confusion. Now knows she is going to SNF  DIAGNOSIS:   AXIS I   Delirium related to medical complex conditions: generalized weakness, shortness of breath, diarrhea; tumor behind left eye. :   AXIS II  Deffered  AXIS III See medical notes.  AXIS IV Problems with medical conditions, weakness requiring Rehab for mobility, stability, supportive family, Dx of eye tumor pending; cont' tx of Guillian Barre  AXIS V GAF  40-59   Assessment/Plan: Discussed with Dr. Izola Price and RN Pt is ready to transfer to SNF.  Delirium related to current illnesses. May be continued confusion but is willing to go to SNF. RECOMMENDATION: 1.  No further psychiatric needs.  MD Psychiatrist signs off.  Cuauhtemoc Huegel J. Charleen Madera,  MD Psychiatrist  03/13/2012   1:29 PM

## 2012-03-14 ENCOUNTER — Encounter (INDEPENDENT_AMBULATORY_CARE_PROVIDER_SITE_OTHER): Payer: BC Managed Care – HMO

## 2012-03-14 LAB — CBC
MCH: 29.3 pg (ref 26.0–34.0)
MCV: 90.6 fL (ref 78.0–100.0)
Platelets: 500 10*3/uL — ABNORMAL HIGH (ref 150–400)
RBC: 3.51 MIL/uL — ABNORMAL LOW (ref 3.87–5.11)
RDW: 13.3 % (ref 11.5–15.5)

## 2012-03-14 LAB — URINE CULTURE

## 2012-03-14 LAB — B. BURGDORFI ANTIBODIES, CSF

## 2012-03-14 LAB — BASIC METABOLIC PANEL
BUN: 6 mg/dL (ref 6–23)
CO2: 21 mEq/L (ref 19–32)
Calcium: 9.7 mg/dL (ref 8.4–10.5)
Creatinine, Ser: 0.49 mg/dL — ABNORMAL LOW (ref 0.50–1.10)

## 2012-03-14 MED ORDER — OMEGA-3 FATTY ACIDS 1000 MG PO CAPS: 1.0000 g | ORAL_CAPSULE | Freq: Every day | ORAL | Status: DC

## 2012-03-14 MED ORDER — ZOLPIDEM TARTRATE 5 MG PO TABS: 2.5000 mg | ORAL_TABLET | Freq: Every evening | ORAL | Status: DC | PRN

## 2012-03-14 MED ORDER — LEVOFLOXACIN 750 MG PO TABS: 750.0000 mg | ORAL_TABLET | Freq: Every day | ORAL | Status: DC

## 2012-03-14 MED ORDER — METOPROLOL SUCCINATE ER 100 MG PO TB24: 100.0000 mg | ORAL_TABLET | Freq: Every day | ORAL | Status: DC

## 2012-03-14 MED ORDER — BOOST / RESOURCE BREEZE PO LIQD: 1.0000 | Freq: Three times a day (TID) | ORAL | Status: DC

## 2012-03-14 MED ORDER — GEMFIBROZIL 600 MG PO TABS: 600.0000 mg | ORAL_TABLET | Freq: Two times a day (BID) | ORAL | Status: DC

## 2012-03-14 MED ORDER — PANTOPRAZOLE SODIUM 40 MG PO PACK: 40.0000 mg | PACK | Freq: Every day | ORAL | Status: DC

## 2012-03-14 MED ORDER — AMLODIPINE-OLMESARTAN 5-20 MG PO TABS: 1.0000 | ORAL_TABLET | Freq: Every day | ORAL | Status: DC

## 2012-03-14 MED ORDER — HYDROCODONE-ACETAMINOPHEN 5-325 MG PO TABS: 1.0000 | ORAL_TABLET | ORAL | Status: DC | PRN

## 2012-03-14 NOTE — Progress Notes (Signed)
Picked up by transport, no bleeding noted in PICC site dressing dry and intact.Patient d/c'd no complaints of pain or discomfort.Report given to Frenida LPN.

## 2012-03-14 NOTE — Progress Notes (Signed)
Speech Language Pathology Dysphagia Treatment Patient Details Name: Abigail Powell MRN: [id number] DOB: [birth year] Today's Date: 03/14/2012 Time: 1012-1039 SLP Time Calculation (min): 27 min  Assessment / Plan / Recommendation Clinical Impression  SLP visit to assess pt tolerance of diet and to educate daughter to tips to mitigate swallow dysfunction.  Conflicting information received from son last week and daughter today - daughter reports pt continues with poor po intake, delayed swallow and intermittent cough with both food and drinks.  SLP clinically evaluated pt's swallow function with nectar thick liquids - throat clearing observed after swallow - delayed swallow continues and pt denies incr ease of swallowing.    Per RN, pt's swallow function is improved compared to last week - taking medications without coughing.  Note pt's pna is clinically improving as well.  Rec continue SLP follow up at SNF for dysphagia management.  If pt's swallow dysfunction does not resolve to baseline level, instrumental assessment may be beneficial.  Daughter who states she is trying to get HCPOA established agreeable to continue diet with strategies to decr amount pt aspirates.      Diet Recommendation  Continue with Current Diet: Dysphagia 3 (mechanical soft);Thin liquid (use thickener if clinically decreases cough with drinks)    SLP Plan Continue with current plan of care   Pertinent Vitals/Pain Low grade temp, pt on room air,    Swallowing Goals  SLP Swallowing Goals Swallow Study Goal #2 - Progress: Progressing toward goal  General Behavior/Cognition: Alert (closing eyes w/i 15 min into session- cues to stay awake) Patient Positioning: Upright in bed    Dysphagia Treatment Treatment focused on: Skilled observation of diet tolerance;Patient/family/caregiver education;Utilization of compensatory strategies Treatment Methods/Modalities: Skilled observation Patient observed directly with PO's: Yes Type  of PO's observed: Dysphagia 1 (puree);Thin liquids;Nectar-thick liquids Feeding: Total assist Liquids provided via: Straw;Cup Pharyngeal Phase Signs & Symptoms: Suspected delayed swallow initiation;Delayed throat clear;Immediate cough Type of cueing: Verbal Amount of cueing: Maximal (to swallow- delays up to 7 seconds - responds to cues)   GO     Donavan Burnet, MS [facility] SLP [phone]

## 2012-03-14 NOTE — Progress Notes (Signed)
Discharge to [facility], report given to [facility] LPN, PICCline removed by IV Nurse, Cindie Laroche d/c'd.Daughter at bedside.

## 2012-03-14 NOTE — Progress Notes (Signed)
Patient cleared for discharge. No family at bedside. CSW called patient's daughter. No answer. Patient cleared for discharge. Packet copied and placed in [place].  Rianne Degraaf C. Bronte Sabado MSW, LCSW [phone]

## 2012-03-14 NOTE — Progress Notes (Signed)
ANTIBIOTIC CONSULT NOTE - FOLLOW UP  Pharmacy Consult for Levaquin Indication: PNA/UTI  Allergies  Allergen Reactions  . Asa (Aspirin) Other (See Comments)    Causes ulcer    Patient Measurements: Height: 5' 2.5" (158.8 cm) Weight: 117 lb 11.6 oz (53.4 kg) (standing scale) IBW/kg (Calculated) : 51.25   Vital Signs: Temp: 99.5 F (37.5 C) (11/05 0522) Temp src: Oral (11/05 0522) BP: 137/89 mmHg (11/05 0522) Pulse Rate: 102  (11/05 0522) Intake/Output from previous day: 11/04 0701 - 11/05 0700 In: 2724.6 [P.O.:360; I.V.:1964.6; IV Piggyback:400] Out: 2550 [Urine:2550]  Labs:  [facility] 03/14/12 0500 03/13/12 0500 03/12/12 0528  WBC 11.3* 12.8* 11.9*  HGB 10.3* 10.1* 10.5*  PLT 500* 525* 538*  LABCREA -- -- --  CREATININE 0.49* 0.52 0.50   Estimated Creatinine Clearance: 59 ml/min (by C-G formula based on Cr of 0.49).  Microbiology: 11/1 urine: enterococcus species (pan-sensitive) 11/2 Legionella Urinary Ag: negative 11/2 S. pneumo urinary Ag: negative  Assessment:  36 YOF admitted 10/23 for weakness and confusion following recent cholecystectomy (on 10/19). Patient started on antibiotics for possible UTI and HCAP (new infiltrate on CXR).  Day #4 Levaquin for enterococcus UTI (sensitive to Levaquin) and PNA.  CrCl ~59 ml/min.  Goal of Therapy:  Eradication of infection; doses adjusted per renal clearance  Plan:  Levaquin dose still appropriate, will switch to PO for transition to outpatient.  Levaquin 750 mg PO daily.  Planning discharge soon.  Clance Boll 03/14/2012,11:28 AM

## 2012-03-14 NOTE — Discharge Summary (Signed)
Physician Discharge Summary  Abigail Powell ZOX:[id number] DOB: 1950-11-16 DOA: 03/01/2012  PCP: Sheila Oats, MD  Admit date: 03/01/2012 Discharge date: 03/14/2012  Recommendations for Outpatient Follow-up:  1. Pt will need to follow up with PCP in 2-3 weeks post discharge 2. Please obtain BMP to evaluate electrolytes and kidney function 3. Please also check CBC to evaluate Hg and Hct levels 4. Please note that pt was sent to SNF on Levaquin to complete the therapy for 5 more days 5. In addition, per neurology it was considered that patient may have a paraneoplastic process and paraneoplastic panel has been sent, this will have to be followed 6. Please note that patient has completed therapy with immunoglobulin for 5 days and will need to followup with neurology in an outpatient setting, consideration for EMG will be necessary per neurology recommendation 7. Patient was cleared for discharge from neurology point of view, discharged to skilled nursing facility   Discharge Diagnoses: Acute encephalopathy secondary to Guillain Barr syndrome , progressive deconditioning and failure to thrive, alcohol use Active Problems:  Encephalopathy acute  Weakness  Hypertension  Hyperlipidemia  Subclinical hyperthyroidism  Hypokalemia  Transaminitis  Alcohol abuse  Quadriparesis  Discharge Condition: Stable  Diet recommendation: Heart healthy diet discussed in details   Brief narrative:  Pt is 61 year old female who is status post cholecystectomy 02/26/2012 and has required an admission 03/01/2012 for further evaluation of progressively worsening mental status associated with poor oral intake and intermittent episodes of watery diarrhea, exertional shortness of breath, and generalized weakness. 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 She has an appointment at [facility] with a ophthalmologist for biopsy.  LP done 10/31 and the  findings consistent with Guillain Barre syndrome, pt started on IVIG.   Active Problems:  Encephalopathy acute  - multifactorial and secondary to acute illness, alcohol use, progressive deconditioning and failure to thrive, now new developing PNA and UTI  - CXR is suggestive of PNA and highly concerning for HCAP  - Antibiotics were narrowed down from broad-spectrum including vancomycin, Levaquin, Maxipime down to Levaquin by mouth - LP findings consistent with Guillain Barre syndrome  - Patient has completed therapy with IV immunoglobulins - recommendation is to discharge pt to SNF for further rehabilitation  Protein calorie malnutrition, moderate  - SLP done and recommendation is to continue Dysphagia III diet  - encouraged PO intake  - albumin on admission 2  Leukocytosis with tachycardia  - likely related to new UTI as UA on admission looked clear but now with leukocytes  - WBC on CBC is trending down  - CXR suggestive of worsening opacity and possibility of PNA  - We treated patient for hospital acquired pneumonia, broad-spectrum antibiotics narrow down to Levaquin - Patient will need to completed therapy with Levaquin for 5 more days PNA, ? HCAP  - continue antibiotic as noted above  - pt is clinically improving and maintaining oxygen saturations at target range  UTI  - as per new UA  - urine culture pending upon discharge - Levaquin should also cover for urinary tract infection Weakness, upper and lower extremity and bilateral  - likely secondary to Guillain Barre syndrome  - IVIG therapy has been completed - appreciate PT input  - Neurologist has recommended followup in outpatient setting and consideration to evaluation with EMG to be given Hypertension  - stable  - will continue Norvasc and Avapro  Anemia of chronic disease  - Hg  and Hct remain stable and at pt's baseline  Hyperlipidemia  - continue statin  Subclinical hyperthyroidism  - could also contribute to  progressive deconditioning  Hypokalemia  - secondary to diarrhea and vomiting  - Within normal limits today Transaminitis  - secondary to recent surgical manipulation  - HIDA scan with no biliary leak indicated  - LTF's trending down  - appreciate GI involvement  Alcohol abuse  - will address the medical issue once pt is more ready for discussion as right now she is still rather weak to participate in meaningful conversation  Quadriparesis  - contributing to progressive failure to thrive  - PT while inpatient  - LP results noted below   Consultants:  GI  Surgery  Neurology  Physical therapy   Procedures/Studies:  Dg Chest Port 1 View  03/11/2012  Slight worsening patchy left lower lobe airspace process or pneumonia.  Mr Thoracic Spine Wo Contrast  03/09/2012  No evidence of cord infarct.  No edema is present in the spinal cord.  Disc degeneration and spondylosis on the right at C3-4.  Disc degeneration and spondylosis on the left at C4-5.  There is spondylosis at C5-6. No cord compression.  Dg Fluoro Guide Ndl Plc/bx  03/09/2012  Successful lumbar puncture at L4-5 under fluoroscopic guidance, as described above.  No apparent complication.  Mr Cervical Spine Wo Contrast  03/08/2012  1. Multilevel degenerative facet arthritis with multilevel slight foraminal stenosis as described above.  2. No disc protrusions or focal neural impingement.  3. Normal cervical spinal cord.   Antibiotics:  Vancomycin 11/02 --> 11/04  Maxipime 11/02 --> 11/04  Levaquin 11/02 --> 11/10  Discharge Exam: Filed Vitals:   03/14/12 0522  BP: 137/89  Pulse: 102  Temp: 99.5 F (37.5 C)  Resp: 16   Filed Vitals:   03/13/12 2221 03/13/12 2329 03/14/12 0010 03/14/12 0522  BP: 131/87 130/84 132/76 137/89  Pulse: 93 92 104 102  Temp: 98.4 F (36.9 C) 97.9 F (36.6 C) 98.1 F (36.7 C) 99.5 F (37.5 C)  TempSrc: Oral Oral Oral Oral  Resp: 18 18 20 16   Height:      Weight:      SpO2:  98% 100% 97% 100%    General: Pt is more alert, follows commands appropriately, not in acute distress Cardiovascular: Regular rate and rhythm, S1/S2 +, no murmurs, no rubs, no gallops Respiratory: Clear to auscultation bilaterally, no wheezing, no crackles, no rhonchi Abdominal: Soft, non tender, non distended, bowel sounds +, no guarding Extremities: no edema, no cyanosis, pulses palpable bilaterally DP and PT Neuro: Improved upper and lower extremity weakness  Discharge Instructions  Discharge Orders    Future Appointments: Provider: Department: Dept Phone: Center:   03/14/2012 2:45 PM [facility], [place] [phone] None     Future Orders Please Complete By Expires   Diet - low sodium heart healthy      Increase activity slowly          Medication List     As of 03/14/2012 11:45 AM    STOP taking these medications         cephALEXin 500 MG capsule   Commonly known as: KEFLEX      VITAMIN C PO      TAKE these medications         amLODipine-olmesartan 5-20 MG per tablet   Commonly known as: AZOR   Take 1 tablet by mouth daily.  feeding supplement Liqd   Take 1 Container by mouth 3 (three) times daily between meals.      fish oil-omega-3 fatty acids 1000 MG capsule   Take 1 capsule (1 g total) by mouth daily.      gemfibrozil 600 MG tablet   Commonly known as: LOPID   Take 1 tablet (600 mg total) by mouth 2 (two) times daily before a meal.      HYDROcodone-acetaminophen 5-325 MG per tablet   Commonly known as: NORCO/VICODIN   Take 1-2 tablets by mouth every 4 (four) hours as needed. Pain      levofloxacin 750 MG tablet   Commonly known as: LEVAQUIN   Take 1 tablet (750 mg total) by mouth daily.      metoprolol succinate 100 MG 24 hr tablet   Commonly known as: TOPROL-XL   Take 1 tablet (100 mg total) by mouth daily. Take with or immediately following a meal.      pantoprazole sodium 40 mg/20 mL Pack   Commonly known  as: PROTONIX   Take 20 mLs (40 mg total) by mouth daily.      VITAMIN D PO   Take 1 tablet by mouth daily.      VITAMIN E PO   Take 1 capsule by mouth daily.      zolpidem 5 MG tablet   Commonly known as: AMBIEN   Take 0.5 tablets (2.5 mg total) by mouth at bedtime as needed for sleep.           Follow-up Information    Follow up with LAYTON, BRIAN DAVID, DO. Schedule an appointment as soon as possible for a visit in 2 weeks. (Make an appointment in another 2-3 weeks.)    Contact information:   [address] [place] [address] [phone]           The results of significant diagnostics from this hospitalization (including imaging, microbiology, ancillary and laboratory) are listed below for reference.     Microbiology: Recent Results (from the past 240 hour(s))  URINE CULTURE     Status: Normal   Collection Time   03/10/12  4:16 PM      Component Value Range Status Comment   Specimen Description URINE, CATHETERIZED   Final    Special Requests NONE   Final    Culture  Setup Time 03/11/2012 02:21   Final    Colony Count 85,000 COLONIES/ML   Final    Culture ENTEROCOCCUS SPECIES   Final    Report Status 03/14/2012 FINAL   Final    Organism ID, Bacteria ENTEROCOCCUS SPECIES   Final      Labs: Basic Metabolic Panel:  Lab 03/14/12 9147 03/13/12 0500 03/12/12 0528 03/11/12 0548 03/10/12 0447  NA 132* 134* 136 135 133*  K 3.8 3.0* 3.2* 3.6 3.6  CL 103 105 105 106 101  CO2 21 20 19 20 21   GLUCOSE 111* 111* 117* 118* 128*  BUN 6 6 6 6 6   CREATININE 0.49* 0.52 0.50 0.51 0.54  CALCIUM 9.7 9.2 9.4 9.2 9.1  MG -- -- -- -- --  PHOS -- -- -- -- --   Liver Function Tests:  Lab 03/11/12 0548  AST 35  ALT 44*  ALKPHOS 156*  BILITOT 0.6  PROT 7.4  ALBUMIN 2.6*   CBC:  Lab 03/14/12 0500 03/13/12 0500 03/12/12 0528 03/11/12 0548 03/10/12 0447  WBC 11.3* 12.8* 11.9* 13.8* 15.5*  NEUTROABS -- -- -- -- --  HGB 10.3* 10.1* 10.5* 11.1* 11.9*  HCT  31.8* 30.1* 32.0* 33.8* 35.8*  MCV 90.6 90.7 91.4 90.9 91.6  PLT 500* 525* 538* 513* 502*   Cardiac Enzymes:  Lab 03/09/12 1000  CKTOTAL 30  CKMB 2.2  CKMBINDEX --  TROPONINI --    SIGNED: Time coordinating discharge: Over 30 minutes  Debbora Presto, MD  [facility] 03/14/2012, 11:45 AM Pager [phone]  If 7PM-7AM, please contact night-coverage [email or web address] Password [email or web address]

## 2012-03-14 NOTE — Progress Notes (Signed)
Per MD order, PICC line removed with ease and the end was intact. Cath intact at 42cm. Vaseline pressure gauze to site, pressure held x . No bleeding to site. Pt instructed to keep dressing CDI x 24 hours. Avoid heavy lifting, pushing or pulling x 24 hours,  If bleeding occurs hold pressure, if bleeding does not stop contact MD or go to the ED. Pt does not have any questions. Consuello Masse

## 2012-03-22 LAB — MISCELLANEOUS TEST

## 2012-03-29 ENCOUNTER — Ambulatory Visit (INDEPENDENT_AMBULATORY_CARE_PROVIDER_SITE_OTHER): Payer: BC Managed Care – HMO | Admitting: General Surgery

## 2012-03-29 ENCOUNTER — Encounter (INDEPENDENT_AMBULATORY_CARE_PROVIDER_SITE_OTHER): Payer: Self-pay | Admitting: General Surgery

## 2012-03-29 VITALS — BP 82/57 | HR 97 | Temp 97.2°F | Resp 16 | Ht 62.0 in

## 2012-03-29 DIAGNOSIS — Z5189 Encounter for other specified aftercare: Secondary | ICD-10-CM

## 2012-03-29 DIAGNOSIS — Z4889 Encounter for other specified surgical aftercare: Secondary | ICD-10-CM

## 2012-03-29 NOTE — Progress Notes (Signed)
Subjective:     Patient ID: Abigail Powell, female   DOB: 04-23-1951, 61 y.o.   MRN: [id number]  HPI This patient follows up a recent hospitalization and laparoscopic cholecystectomy. She had gallstones and has had a poor appetite and nausea preoperatively and she says that she has done well since her surgery. She was recently diagnosed with Guillain-Barre syndrome.  She was also started on some hyperthyroid medications. She says that she feels about the same as before her surgery but her daughter and her caregivers say that she has a good appetite and that she is eating fine. She is moving her bowels she does have some loose stools.  Review of Systems     Objective:   Physical Exam She is in no acute distress and nontoxic-appearing Her abdomen is soft and nontender on exam her incisions are healed and there is no sign of infection or hernia. Her blood pressure is low today at 82/57 but she is mentating fine and looks fine.    Assessment:     Status post endoscopic cholecystectomy She appears to be doing fine from her procedure and there is no evidence of any postoperative complications. It is still unclear as to whether this has really improved her symptoms or if this was even the cause of her symptoms. She also has a history of peptic ulcer disease Next if she continues to have nausea and then I would recommend followup EGD for evaluation. She has a lot of things going on right now with her current evaluation of her orbital tumor as well as a recent diagnosis  Of Guillain Barre syndrome.  She is hypotensive today and I noticed that she is on 3 different blood pressure medications. I would recommend that she discuss with her primary caregiver holding her discontinuing some of her blood pressure medication. I also discussed this with the daughter and her chaperone and they will bring this up with the physician at her rehabilitation facility.    Plan:      She seems to be doing doing okay from her  procedure and there is no evidence of any postoperative complication.she can continue with activity as tolerated. A would recommend discontinuing at least one or some of her blood pressure medications. She can follow up with me on a when necessary basis. If she continues to have any nausea or anorexia in the future I would recommend EGD to ensure that there is no evidence of recurrent peptic ulcers .

## 2012-04-10 ENCOUNTER — Other Ambulatory Visit (HOSPITAL_BASED_OUTPATIENT_CLINIC_OR_DEPARTMENT_OTHER): Payer: Self-pay | Admitting: Internal Medicine

## 2012-04-10 DIAGNOSIS — E0590 Thyrotoxicosis, unspecified without thyrotoxic crisis or storm: Secondary | ICD-10-CM

## 2012-05-11 ENCOUNTER — Encounter (HOSPITAL_COMMUNITY)
Admission: RE | Admit: 2012-05-11 | Discharge: 2012-05-11 | Disposition: A | Discharge status: Home / Self Care | Payer: BC Managed Care – HMO | Source: Ambulatory Visit | Attending: Internal Medicine | Admitting: Internal Medicine

## 2012-05-11 DIAGNOSIS — E0590 Thyrotoxicosis, unspecified without thyrotoxic crisis or storm: Secondary | ICD-10-CM | POA: Insufficient documentation

## 2012-05-12 ENCOUNTER — Encounter (HOSPITAL_COMMUNITY): Payer: BC Managed Care – HMO

## 2012-05-26 ENCOUNTER — Emergency Department (HOSPITAL_COMMUNITY)
Admission: EM | Admit: 2012-05-26 | Discharge: 2012-05-26 | Disposition: A | Discharge status: Home / Self Care | Payer: BC Managed Care – HMO | Attending: Emergency Medicine | Admitting: Emergency Medicine

## 2012-05-26 ENCOUNTER — Emergency Department (HOSPITAL_COMMUNITY): Payer: BC Managed Care – HMO

## 2012-05-26 ENCOUNTER — Encounter (HOSPITAL_COMMUNITY): Payer: Self-pay | Admitting: *Deleted

## 2012-05-26 DIAGNOSIS — R5383 Other fatigue: Secondary | ICD-10-CM

## 2012-05-26 DIAGNOSIS — Z79899 Other long term (current) drug therapy: Secondary | ICD-10-CM | POA: Insufficient documentation

## 2012-05-26 DIAGNOSIS — K219 Gastro-esophageal reflux disease without esophagitis: Secondary | ICD-10-CM | POA: Insufficient documentation

## 2012-05-26 DIAGNOSIS — Z8701 Personal history of pneumonia (recurrent): Secondary | ICD-10-CM | POA: Insufficient documentation

## 2012-05-26 DIAGNOSIS — E785 Hyperlipidemia, unspecified: Secondary | ICD-10-CM | POA: Insufficient documentation

## 2012-05-26 DIAGNOSIS — Z8669 Personal history of other diseases of the nervous system and sense organs: Secondary | ICD-10-CM | POA: Insufficient documentation

## 2012-05-26 DIAGNOSIS — G4700 Insomnia, unspecified: Secondary | ICD-10-CM | POA: Insufficient documentation

## 2012-05-26 DIAGNOSIS — E1142 Type 2 diabetes mellitus with diabetic polyneuropathy: Secondary | ICD-10-CM | POA: Insufficient documentation

## 2012-05-26 DIAGNOSIS — F1010 Alcohol abuse, uncomplicated: Secondary | ICD-10-CM | POA: Insufficient documentation

## 2012-05-26 DIAGNOSIS — R627 Adult failure to thrive: Secondary | ICD-10-CM | POA: Insufficient documentation

## 2012-05-26 DIAGNOSIS — I10 Essential (primary) hypertension: Secondary | ICD-10-CM | POA: Insufficient documentation

## 2012-05-26 DIAGNOSIS — E1149 Type 2 diabetes mellitus with other diabetic neurological complication: Secondary | ICD-10-CM | POA: Insufficient documentation

## 2012-05-26 DIAGNOSIS — H5789 Other specified disorders of eye and adnexa: Secondary | ICD-10-CM | POA: Insufficient documentation

## 2012-05-26 DIAGNOSIS — Z8639 Personal history of other endocrine, nutritional and metabolic disease: Secondary | ICD-10-CM | POA: Insufficient documentation

## 2012-05-26 DIAGNOSIS — Z8719 Personal history of other diseases of the digestive system: Secondary | ICD-10-CM | POA: Insufficient documentation

## 2012-05-26 DIAGNOSIS — R5381 Other malaise: Secondary | ICD-10-CM | POA: Insufficient documentation

## 2012-05-26 DIAGNOSIS — Z862 Personal history of diseases of the blood and blood-forming organs and certain disorders involving the immune mechanism: Secondary | ICD-10-CM | POA: Insufficient documentation

## 2012-05-26 LAB — URINALYSIS, ROUTINE W REFLEX MICROSCOPIC
Glucose, UA: NEGATIVE mg/dL
Ketones, ur: NEGATIVE mg/dL
Nitrite: NEGATIVE
Protein, ur: NEGATIVE mg/dL
Urobilinogen, UA: 0.2 mg/dL (ref 0.0–1.0)

## 2012-05-26 LAB — URINE MICROSCOPIC-ADD ON

## 2012-05-26 LAB — CBC WITH DIFFERENTIAL/PLATELET
Basophils Absolute: 0 10*3/uL (ref 0.0–0.1)
Eosinophils Relative: 3 % (ref 0–5)
HCT: 37.3 % (ref 36.0–46.0)
Lymphocytes Relative: 40 % (ref 12–46)
MCHC: 31.9 g/dL (ref 30.0–36.0)
MCV: 97.9 fL (ref 78.0–100.0)
Monocytes Absolute: 0.7 10*3/uL (ref 0.1–1.0)
Monocytes Relative: 12 % (ref 3–12)
RDW: 13.6 % (ref 11.5–15.5)
WBC: 6 10*3/uL (ref 4.0–10.5)

## 2012-05-26 LAB — COMPREHENSIVE METABOLIC PANEL
AST: 25 U/L (ref 0–37)
BUN: 19 mg/dL (ref 6–23)
CO2: 22 mEq/L (ref 19–32)
Calcium: 9.4 mg/dL (ref 8.4–10.5)
Creatinine, Ser: 0.63 mg/dL (ref 0.50–1.10)
GFR calc Af Amer: >90 mL/min (ref >90)
GFR calc non Af Amer: >90 mL/min (ref >90)

## 2012-05-26 LAB — TROPONIN I: Troponin I: <0.3 ng/mL (ref <0.30)

## 2012-05-26 NOTE — ED Notes (Signed)
[facility] arrived for transport 

## 2012-05-26 NOTE — ED Notes (Signed)
EMS-pt is from [facility] where she is receiving OT/PT for weakness and possible Guillain-Barre. She was seen and discharged from [facility] after periortibal tumor. Today staff reports that pt was hard to arouse, states she is usually alert and responds quickly. Staff is unable to state when these symptoms started. CBG on scene was 137 by NH, on arrival to bridge CBG was 79. 20g(L)hand. At this time pt appears for lethargic but pt is able to follow commands and answer questions. Pts paper work states that she may have dementia, pt is able to tell me name and birthday and that she is at the hospital. Unable to tell me year where she is currently residing. Pt has no neuro deficits. Is complaining of a frontal headache that she is unsure of when it started.

## 2012-05-26 NOTE — ED Provider Notes (Signed)
History     CSN: [id number]  Arrival date & time 05/26/12  1527   First MD Initiated Contact with Patient 05/26/12 1637      Chief Complaint  Patient presents with  . Altered Mental Status    (Consider location/radiation/quality/duration/timing/severity/associated sxs/prior treatment) The history is provided by the patient, the EMS personnel and the nursing home.   The patient is a 62 year old the female currently a resident at [facility]. Patient was stricken with GB back in October which basically made her quadriplegic she slowly been recovering hasn't significant improvement in her leg strength not so much and arm strength she's at the nursing facility for physical therapy and occupational therapy she is progressing well. She also has a long-standing left periorbital tumor or mass recently evaluated at [facility] in December of a release some of that in the soft tissues A. assume is benign but they don't know for sure based on the fact it's been there for about a year most likely is benign.  Today during her physical therapy patient's noted to be very lethargic and was hard to arouse and seemed to be as answering questions quickly as usual and not as alert as usual. Patient was sent in for evaluation.  Upon arrival here patient said she was just tired she was alert and oriented and to questions appropriately. Motor strength in all 4 extremities weaker in the upper extremities according to patient this was baseline. Past Medical History  Diagnosis Date  . Diverticulosis   . Hypertension   . 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 H/O gastric ulcer   . Hypopotassemia   . Muscle weakness (generalized)   . Lack of coordination   . Symbolic dysfunction, unspecified   . Essential hypertension, benign   . Other and unspecified hyperlipidemia   . Thyrotoxicosis without mention of goiter or other cause, without mention of thyrotoxic crisis  or storm   . Reflux esophagitis   . GERD (gastroesophageal reflux disease)   . Insomnia, unspecified   . Polyneuropathy in diabetes(357.2)     History of   . Pneumonia, organism unspecified   . Benign neoplasm of eye, part unspecified   . Alcohol abuse, unspecified   . Other quadriplegia and quadriparesis   . Pneumonia, organism unspecified 2013    History of pneumonia  . Adult failure to thrive     Past Surgical History  Procedure Date  . Cholecystectomy 02/26/2012    Procedure: LAPAROSCOPIC CHOLECYSTECTOMY WITH INTRAOPERATIVE CHOLANGIOGRAM;  Surgeon: Lodema Pilot, DO;  Location: [facility];  Service: General;  Laterality: N/A;  . Abdominal hysterectomy   . Orthoscopic knee surgery   . Abdominal hysterectomy     Family History  Problem Relation Age of Onset  . Ovarian cancer    . Prostate cancer    . Hypertension      History  Substance Use Topics  . Smoking status: Never Smoker   . Smokeless tobacco: Never Used  . Alcohol Use: Yes     Comment: pt's daughter reports pt drinks beer and scotch every day keeping a thermos filled all day; unsure of how much      OB History    Grav Para Term Preterm Abortions TAB SAB Ect Mult Living                  Review of Systems  Constitutional: Positive for fatigue. Negative for fever.  HENT:  Negative for congestion.   Respiratory: Negative for shortness of breath.   Cardiovascular: Negative for chest pain.  Gastrointestinal: Negative for nausea, vomiting, abdominal pain and diarrhea.  Genitourinary: Negative for dysuria.  Musculoskeletal: Negative for back pain.  Skin: Negative for rash.  Neurological: Positive for weakness.  Hematological: Does not bruise/bleed easily.    Allergies  Phenergan; Asa; and Zofran  Home Medications   Current Outpatient Rx  Name  Route  Sig  Dispense  Refill  . AMLODIPINE-OLMESARTAN 5-20 MG PO TABS   Oral   Take 1 tablet by mouth daily.   30 tablet   3   . VITAMIN D PO   Oral   Take  1 tablet by mouth daily.         . RESOURCE BREEZE PO LIQD   Oral   Take 1 Container by mouth 3 (three) times daily between meals.   90 Container   3   . OMEGA-3 FATTY ACIDS 1000 MG PO CAPS   Oral   Take 1 capsule (1 g total) by mouth daily.   30 capsule   3   . GEMFIBROZIL 600 MG PO TABS   Oral   Take 1 tablet (600 mg total) by mouth 2 (two) times daily before a meal.   60 tablet   3   . HYDROCODONE-ACETAMINOPHEN 5-325 MG PO TABS   Oral   Take 1-2 tablets by mouth every 4 (four) hours as needed. Pain   65 tablet   0   . METOPROLOL SUCCINATE ER 100 MG PO TB24   Oral   Take 1 tablet (100 mg total) by mouth daily. Take with or immediately following a meal.   30 tablet   3   . PANTOPRAZOLE SODIUM 40 MG PO TBEC   Oral   Take 40 mg by mouth daily.         Marland Kitchen VITAMIN E PO   Oral   Take 1 capsule by mouth daily.         Marland Kitchen ZOLPIDEM TARTRATE 5 MG PO TABS   Oral   Take 5 mg by mouth at bedtime as needed. For sleep           BP 141/78  Pulse 75  Temp 98.8 F (37.1 C) (Oral)  Resp 18  SpO2 97%  Physical Exam  Nursing note and vitals reviewed. Constitutional: She is oriented to person, place, and time. She appears well-developed and well-nourished. No distress.  HENT:  Head: Normocephalic and atraumatic.  Mouth/Throat: Oropharynx is clear and moist.  Eyes: Conjunctivae normal and EOM are normal. Pupils are equal, round, and reactive to light.       Bulging of the left eye.  Neck: Normal range of motion. Neck supple.  Cardiovascular: Normal rate, regular rhythm and normal heart sounds.   No murmur heard. Pulmonary/Chest: Effort normal and breath sounds normal. No respiratory distress. She has no wheezes. She has no rales.  Abdominal: Soft. Bowel sounds are normal. There is no tenderness.  Musculoskeletal: Normal range of motion. She exhibits no edema.  Neurological: She is alert and oriented to person, place, and time. No cranial nerve deficit. She exhibits  normal muscle tone. Coordination normal.  Skin: Skin is warm. No rash noted.    ED Course  Procedures (including critical care time)  Labs Reviewed  CBC WITH DIFFERENTIAL - Abnormal; Notable for the following:    RBC 3.81 (*)     Hemoglobin 11.9 (*)  All other components within normal limits  COMPREHENSIVE METABOLIC PANEL - Abnormal; Notable for the following:    Albumin 3.2 (*)     Total Bilirubin 0.1 (*)     All other components within normal limits  URINALYSIS, ROUTINE W REFLEX MICROSCOPIC - Abnormal; Notable for the following:    Leukocytes, UA TRACE (*)     All other components within normal limits  URINE MICROSCOPIC-ADD ON - Abnormal; Notable for the following:    Squamous Epithelial / LPF FEW (*)     All other components within normal limits  TROPONIN I   Ct Head Wo Contrast  05/26/2012  *RADIOLOGY REPORT*  Clinical Data: Altered mental status.  CT HEAD WITHOUT CONTRAST  Technique:  Contiguous axial images were obtained from the base of the skull through the vertex without contrast.  Comparison: Head CT 03/01/2012.  Findings: The ventricles are normal.  No extra-axial fluid collections are seen.  The brainstem and cerebellum are unremarkable.  No acute intracranial findings such as infarction or hemorrhage.  No mass lesions.  Mild stable cerebral atrophy. A small left orbital mass is again demonstrated.  A remote blow in type fracture of the orbital wall is suspected or previous surgical changes.  No acute bony findings.  Left half sphenoid sinus disease is noted.  IMPRESSION:  1.  Mild stable cerebral atrophy. 2.  No acute intracranial findings or mass lesion. 3.  Stable small left orbital lesion.   Original Report Authenticated By: Rudie Meyer, M.D.    Dg Chest Port 1 View  05/26/2012  *RADIOLOGY REPORT*  Clinical Data: Altered mental status  PORTABLE CHEST - 1 VIEW  Comparison: 03/12/2012  Findings: Patient rotated right. Cardiomegaly accentuated by AP portable technique.   No right and no definite left pleural effusion. No pneumothorax.  Low lung volumes with resultant pulmonary interstitial prominence.  Patchy bibasilar airspace disease.  IMPRESSION: Cardiomegaly and low lung volumes with bibasilar atelectasis.   Original Report Authenticated By: Jeronimo Greaves, M.D.     Date: 05/26/2012  Rate: 74  Rhythm: normal sinus rhythm  QRS Axis: normal  Intervals: normal  ST/T Wave abnormalities: normal  Conduction Disutrbances:none  Narrative Interpretation:   Old EKG Reviewed: changes noted Abnormal R wave progression possible lead placement compared to EKG from 03/01/2012.   1. Fatigue       MDM   Patient brought in by EMS from [facility]. Patient was getting her occupational therapy and physical therapy when she appeared to be very lethargic was sent in for evaluation. Patient and staff at the facility so the patient was hard to arouse she is usually alert responds quickly. Staff is unable to state when the symptoms started. Her blood gas at the scene was 137 when she arrived here it was 79. Family member has arrived. Patient seems to be baseline status quo for them.  Patient recently was at [facility] several weeks ago for evaluation of a left peribulbar tumor which is most likely benign but not certain displacement pressure on the eyes there was release of that. Patient has no specific complaints this states she's been feeling tired denies chest pain no significant headache no flulike symptoms no upper respiratory symptoms no bowel pain no trouble breathing.  Patient GB seems to be improving no certain no evidence of set back on that today.   Workup here without significant findings head CT is negative no change in the periorbital tumor chest x-rays negative labs are negative urinalysis  is negative. Patient smiling alert will discharge back to the facility.        Shelda Jakes, MD 05/26/12 2038

## 2012-05-29 ENCOUNTER — Encounter (HOSPITAL_COMMUNITY)
Admission: RE | Admit: 2012-05-29 | Discharge: 2012-05-29 | Disposition: A | Discharge status: Home / Self Care | Payer: BC Managed Care – HMO | Source: Ambulatory Visit | Attending: Internal Medicine | Admitting: Internal Medicine

## 2012-05-30 ENCOUNTER — Encounter (HOSPITAL_COMMUNITY): Payer: BC Managed Care – HMO

## 2012-06-19 ENCOUNTER — Encounter (HOSPITAL_COMMUNITY)
Admission: RE | Admit: 2012-06-19 | Discharge: 2012-06-19 | Disposition: A | Discharge status: Home / Self Care | Payer: BC Managed Care – HMO | Source: Ambulatory Visit | Attending: Internal Medicine | Admitting: Internal Medicine

## 2012-06-19 DIAGNOSIS — E0590 Thyrotoxicosis, unspecified without thyrotoxic crisis or storm: Secondary | ICD-10-CM | POA: Insufficient documentation

## 2012-06-20 ENCOUNTER — Ambulatory Visit (HOSPITAL_COMMUNITY)
Admission: RE | Admit: 2012-06-20 | Discharge: 2012-06-20 | Disposition: A | Discharge status: Home / Self Care | Payer: BC Managed Care – HMO | Source: Ambulatory Visit | Attending: Internal Medicine | Admitting: Internal Medicine

## 2012-06-20 MED ORDER — SODIUM PERTECHNETATE TC 99M INJECTION
10.0000 | Freq: Once | INTRAVENOUS | Status: AC | PRN
  Administered 2012-06-20: 10 via INTRAVENOUS

## 2012-06-20 MED ORDER — SODIUM IODIDE I 131 CAPSULE
14.0000 | Freq: Once | INTRAVENOUS | Status: AC | PRN
  Administered 2012-06-20: 14 via ORAL

## 2012-06-29 ENCOUNTER — Other Ambulatory Visit: Payer: Self-pay | Admitting: Internal Medicine

## 2012-06-29 DIAGNOSIS — Z1231 Encounter for screening mammogram for malignant neoplasm of breast: Secondary | ICD-10-CM

## 2012-07-04 ENCOUNTER — Ambulatory Visit: Payer: BC Managed Care – HMO

## 2012-08-03 ENCOUNTER — Other Ambulatory Visit: Payer: Self-pay | Admitting: Endocrinology

## 2012-08-03 DIAGNOSIS — E0590 Thyrotoxicosis, unspecified without thyrotoxic crisis or storm: Secondary | ICD-10-CM

## 2012-08-07 HISTORY — PX: TUMOR REMOVAL: SHX12

## 2012-08-21 ENCOUNTER — Other Ambulatory Visit: Payer: BC Managed Care – HMO

## 2012-08-22 ENCOUNTER — Other Ambulatory Visit: Payer: BC Managed Care – HMO

## 2012-08-31 ENCOUNTER — Ambulatory Visit
Admission: RE | Admit: 2012-08-31 | Discharge: 2012-08-31 | Disposition: A | Discharge status: Home / Self Care | Payer: BC Managed Care – HMO | Source: Ambulatory Visit | Attending: Endocrinology | Admitting: Endocrinology

## 2012-08-31 DIAGNOSIS — E0590 Thyrotoxicosis, unspecified without thyrotoxic crisis or storm: Secondary | ICD-10-CM

## 2012-09-06 ENCOUNTER — Ambulatory Visit (INDEPENDENT_AMBULATORY_CARE_PROVIDER_SITE_OTHER): Payer: BC Managed Care – PPO | Admitting: Neurology

## 2012-09-06 ENCOUNTER — Encounter: Payer: Self-pay | Admitting: Neurology

## 2012-09-06 VITALS — BP 146/92 | HR 63 | Temp 97.0°F | Ht 64.0 in | Wt 160.0 lb

## 2012-09-06 DIAGNOSIS — R413 Other amnesia: Secondary | ICD-10-CM

## 2012-09-06 DIAGNOSIS — Z8669 Personal history of other diseases of the nervous system and sense organs: Secondary | ICD-10-CM

## 2012-09-06 HISTORY — DX: Personal history of other diseases of the nervous system and sense organs: Z86.69

## 2012-09-06 HISTORY — DX: Other amnesia: R41.3

## 2012-09-06 NOTE — Progress Notes (Signed)
Subjective:    Patient ID: Abigail Powell is a 62 y.o. female.  HPI  Interim history:   Abigail Powell is a very pleasant 62 year old right-handed woman who presents for followup consultation of her memory loss and recent diagnosis of Guillain Barre syndrome. She's accompanied by her daughter again today. I first met him on 06/08/2012, at which time her daughter told me that Abigail Powell was diagnosed with GBS in October 2013 and was shortly thereafter diagnosed with dementia. She was still residing in [place] at the time and had a prodromal illness of a UTI in September of last year, treated with antibiotics and then shortly thereafter she developed gallstones and had a cholecystectomy in October 2013 but gradually became weak and had largely weakness to the point where she couldn't get up from the toilet commode. She was treated at [facility] with IVIG for 5 days and had a spinal tap with CSF studies. She went to rehabilitation and [facility] and discharged from there at the end of Powell shortly before I saw her for the first time. At the time of her first visit with me she was on Namenda which was started during rehabilitation. A head CT from October 2013 showed mild cerebral and cerebellar atrophy. She has an underlying medical history of reflux disease, hypertension, hyperlipidemia, insomnia, hyperthyroidism, diverticulosis, gastric ulcer, pneumonia, anemia, cervical degenerative disc disease. She has a history of orbital tumor for which she was scheduled to see a doctor at [facility]. I felt that her memory loss was still in keeping with metabolic encephalopathy. She also gave a history of alcohol abuse in the past. Her thyroid dysfunction is also likely a contributor to her memory loss. She was going to see endocrinologist in February for that. Her current medications are Namenda, Vicodin, omega-3, vitamin D, prednisone, Ambien 2.5 mg at night for sleep, gemfibrozil, Protonix, Toprol-XL,  methimazole. Her daughter provides most of the history again today. 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 She has been complaining of blurry vision and some double vision. She also had a change in her methimazole dose to 10 mg per her endocrinologist.  Her Past Medical History Is Significant For: Past Medical History  Diagnosis Date  . Diverticulosis   . Hypertension   . 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 H/O gastric ulcer   . Hypopotassemia   . Muscle weakness (generalized)   . Lack of coordination   . Symbolic dysfunction, unspecified   . Essential hypertension, benign   . Other and unspecified hyperlipidemia   . Thyrotoxicosis without mention of goiter or other cause, without mention of thyrotoxic crisis or storm   . Reflux esophagitis   . GERD (gastroesophageal reflux disease)   . Insomnia, unspecified   . Polyneuropathy in diabetes(357.2)     History of   . Pneumonia, organism unspecified   . Benign neoplasm of eye, part unspecified   . Alcohol abuse, unspecified   . Other quadriplegia and quadriparesis   . Pneumonia, organism unspecified 2013    History of pneumonia  . Adult failure to thrive     Her Past Surgical History Is Significant For: Past Surgical History  Procedure Laterality Date  . Cholecystectomy  02/26/2012    Procedure: LAPAROSCOPIC CHOLECYSTECTOMY WITH INTRAOPERATIVE CHOLANGIOGRAM;  Surgeon: Lodema Pilot, DO;  Location: [facility];  Service: General;  Laterality: N/A;  . Abdominal hysterectomy    . Orthoscopic knee surgery    . Abdominal hysterectomy    . Tumor removal Left 08/07/12    behind left eye    Her Family History Is Significant For: Family History  Problem Relation Age of Onset  . Ovarian cancer    . Prostate cancer    . Hypertension      Her Social History Is Significant For: History   Social History  . Marital  Status: Legally Separated    Spouse Name: N/A    Number of Children: N/A  . Years of Education: N/A   Social History Main Topics  . Smoking status: Never Smoker   . Smokeless tobacco: Never Used  . Alcohol Use: Yes     Comment: pt's daughter reports pt drinks beer and scotch every day keeping a thermos filled all day; unsure of how much    . Drug Use: No  . Sexually Active:    Other Topics Concern  . None   Social History Narrative  . None    Her Allergies Are:  Allergies  Allergen Reactions  . Phenergan (Promethazine Hcl) Shortness Of Breath  . Asa (Aspirin) Other (See Comments)    Causes ulcer  . Zofran (Ondansetron Hcl) Other (See Comments)    Unknown reaction per pt  :   Her Current Medications Are:  Outpatient Encounter Prescriptions as of 09/06/2012  Medication Sig Dispense Refill  . Cholecalciferol (VITAMIN D PO) Take 1 tablet by mouth daily.      . fish oil-omega-3 fatty acids 1000 MG capsule Take 1 capsule (1 g total) by mouth daily.  30 capsule  3  . gemfibrozil (LOPID) 600 MG tablet Take 1 tablet (600 mg total) by mouth 2 (two) times daily before a meal.  60 tablet  3  . methimazole (TAPAZOLE) 10 MG tablet       . metoprolol succinate (TOPROL-XL) 100 MG 24 hr tablet Take 1 tablet (100 mg total) by mouth daily. Take with or immediately following a meal.  30 tablet  3  . NAMENDA 10 MG tablet       . VITAMIN E PO Take 1 capsule by mouth daily.      Marland Kitchen amLODipine-olmesartan (AZOR) 5-20 MG per tablet Take 1 tablet by mouth daily.  30 tablet  3  . feeding supplement (RESOURCE BREEZE) LIQD Take 1 Container by mouth 3 (three) times daily between meals.  90 Container  3  . HYDROcodone-acetaminophen (NORCO/VICODIN) 5-325 MG per tablet Take 1-2 tablets by mouth every 4 (four) hours as needed. Pain  65 tablet  0  . pantoprazole (PROTONIX) 40 MG tablet Take 40 mg by mouth daily.      Marland Kitchen zolpidem (AMBIEN) 5 MG tablet Take 5 mg by mouth at bedtime as needed. For sleep       No  facility-administered encounter medications on file as of 09/06/2012.  :  Review of Systems  Constitutional: Negative for fever, chills, appetite change and fatigue.  Eyes: Positive for visual disturbance (double vision).  Musculoskeletal: Positive for myalgias.  Neurological: Positive for weakness.    Objective:  Neurologic Exam  Physical Exam Physical Examination:   Filed Vitals:   09/06/12 1536  BP: 146/92  Pulse: 63  Temp: 97 F (36.1 C)   General Examination: The patient is a very pleasant 62 y.o. female in no acute distress. She appears well-developed and well-nourished and well groomed.  HEENT exam: Pupils are reactive  to light. She has bilateral proptosis, But the exophthalmos is more prominent on the left. Extraocular tracking is limited on the L and proptosis seems worse on the L as compared to last time. She has decrease in vision in the left eye particularly. Milder decrease in vision is noted on the right. Fundoscopic exam is not possible for me. Face is symmetric with normal facial animation. Speech is clear. Neck is supple with full range of motion. Hearing is intact. Oropharynx is clear. Swallowing mechanism is preserved. There is no drooling. L eye is slightly deviated out. No nystagmus. Chest is clear to auscultation bilaterally with no crackles or wheezing noted. Heart sounds are normal without murmurs, rubs or gallops. Abdomen is soft, nontender with normal bowel sounds appreciated. She has no pitting edema in the distal lower extremities bilaterally. Skin is warm and dry. Neurologically: Mental status: The patient is awake, alert and oriented in in terms of situation, doctor's office, day of the week but not exact date. Her memory, attention, language and knowledge are appropriate. She is able to answer questions appropriately but is not able to provide much in the way of concise history. She is less confused about dates and circumstances.  Cranial nerves are as  described under HEENT exam. Motor exam: Normal bulk and tone is noted. Strength is globally 4/5. Reflexes are 1+ in the UEs and absent in the lower extremities. Sensory exam is intact and unchanged. Fine motor skills are fairly intact. She has no truncal or gait ataxia. She does walk with the help of a walker which is too wheeled but maneuvers it well.    Assessment and Plan:    In summary, Ms. Yaw is a 62 yo RH woman, who had a recent Dx and treatment of GBS. She was also diagnosed with new onset dementia. On exam, she has ongoing mild global weakness and continues to mobilize with a 2 wheeled walker. She has undergone surgery for left orbital tumor, and on my exam she has a little bit worse left proptosis and exotropia with decrease in eye motility on the left as compared to last time. She has followup with her doctors at [facility] in the next couple weeks. As far as her memory loss is concerned she seems less confused today but is still not fully oriented. She has had a recent adjustment in her thyroid medicine. I do want to hold off on any further formal memory testing at this time because I do believe her recent medical issues including her thyroid issues have to be more stabilized. I would like to reassess her MMSE. I would like to see her back in 3 months and suggested no new medicines. I made a referral for physical therapy outpatient to help with strengthening exercises and gait and balance. In the interim I would like for them to call with any questions concerns or problems or updates. She demonstrated understanding and she and her daughter were in agreement.

## 2012-09-06 NOTE — Patient Instructions (Addendum)
I am going to request physical therapy. We are not adding any new medication at this time. I would like to follow your memory issues and see her back in 3 months from now. He memory loss may have to do with your recent thyroid dysfunction as well so we have to make sure you are stable in that regard. Please continue your followup with your eye doctor and neurosurgeon and your thyroid doctor as well.

## 2012-09-14 ENCOUNTER — Ambulatory Visit: Payer: BC Managed Care – HMO | Attending: Neurology | Admitting: Physical Therapy

## 2012-09-14 DIAGNOSIS — M6281 Muscle weakness (generalized): Secondary | ICD-10-CM | POA: Insufficient documentation

## 2012-09-14 DIAGNOSIS — R269 Unspecified abnormalities of gait and mobility: Secondary | ICD-10-CM | POA: Insufficient documentation

## 2012-09-14 DIAGNOSIS — Z5189 Encounter for other specified aftercare: Secondary | ICD-10-CM | POA: Insufficient documentation

## 2012-09-14 DIAGNOSIS — R279 Unspecified lack of coordination: Secondary | ICD-10-CM | POA: Insufficient documentation

## 2012-09-19 ENCOUNTER — Ambulatory Visit: Payer: BC Managed Care – HMO

## 2012-09-21 ENCOUNTER — Ambulatory Visit: Payer: BC Managed Care – HMO

## 2012-09-24 ENCOUNTER — Other Ambulatory Visit: Payer: Self-pay | Admitting: Adult Health

## 2012-09-26 ENCOUNTER — Ambulatory Visit: Payer: BC Managed Care – HMO

## 2012-09-28 ENCOUNTER — Ambulatory Visit: Payer: BC Managed Care – HMO | Admitting: Occupational Therapy

## 2012-09-28 ENCOUNTER — Ambulatory Visit: Payer: BC Managed Care – HMO

## 2012-09-29 ENCOUNTER — Ambulatory Visit: Payer: BC Managed Care – HMO | Admitting: Occupational Therapy

## 2012-10-04 ENCOUNTER — Ambulatory Visit: Payer: BC Managed Care – HMO | Admitting: *Deleted

## 2012-10-04 ENCOUNTER — Ambulatory Visit: Payer: BC Managed Care – HMO | Admitting: Occupational Therapy

## 2012-10-06 ENCOUNTER — Ambulatory Visit: Payer: BC Managed Care – HMO | Admitting: *Deleted

## 2012-10-09 ENCOUNTER — Ambulatory Visit: Payer: BC Managed Care – HMO | Attending: Neurology

## 2012-10-09 DIAGNOSIS — Z5189 Encounter for other specified aftercare: Secondary | ICD-10-CM | POA: Insufficient documentation

## 2012-10-09 DIAGNOSIS — R269 Unspecified abnormalities of gait and mobility: Secondary | ICD-10-CM | POA: Insufficient documentation

## 2012-10-09 DIAGNOSIS — R279 Unspecified lack of coordination: Secondary | ICD-10-CM | POA: Insufficient documentation

## 2012-10-09 DIAGNOSIS — M6281 Muscle weakness (generalized): Secondary | ICD-10-CM | POA: Insufficient documentation

## 2012-10-12 ENCOUNTER — Ambulatory Visit: Payer: BC Managed Care – HMO | Admitting: Occupational Therapy

## 2012-10-12 ENCOUNTER — Ambulatory Visit: Payer: BC Managed Care – HMO

## 2012-10-17 ENCOUNTER — Ambulatory Visit: Payer: BC Managed Care – HMO | Admitting: Physical Therapy

## 2012-10-17 ENCOUNTER — Ambulatory Visit: Payer: BC Managed Care – HMO | Admitting: Occupational Therapy

## 2012-10-19 ENCOUNTER — Ambulatory Visit: Payer: BC Managed Care – HMO | Admitting: Occupational Therapy

## 2012-10-19 ENCOUNTER — Ambulatory Visit: Payer: BC Managed Care – HMO | Admitting: Physical Therapy

## 2012-10-24 ENCOUNTER — Ambulatory Visit: Payer: BC Managed Care – HMO | Admitting: Occupational Therapy

## 2012-10-24 ENCOUNTER — Ambulatory Visit: Payer: BC Managed Care – HMO | Admitting: Physical Therapy

## 2012-10-26 ENCOUNTER — Ambulatory Visit: Payer: BC Managed Care – HMO | Admitting: Occupational Therapy

## 2012-10-27 ENCOUNTER — Ambulatory Visit: Payer: BC Managed Care – HMO | Admitting: Physical Therapy

## 2012-10-31 ENCOUNTER — Ambulatory Visit: Payer: BC Managed Care – HMO | Admitting: Physical Therapy

## 2012-10-31 ENCOUNTER — Ambulatory Visit: Payer: BC Managed Care – HMO | Admitting: Occupational Therapy

## 2012-11-02 ENCOUNTER — Ambulatory Visit: Payer: BC Managed Care – HMO | Admitting: Occupational Therapy

## 2012-11-02 ENCOUNTER — Ambulatory Visit: Payer: BC Managed Care – HMO | Admitting: Physical Therapy

## 2012-11-02 ENCOUNTER — Other Ambulatory Visit: Payer: Self-pay | Admitting: Adult Health

## 2012-11-07 ENCOUNTER — Ambulatory Visit: Payer: BC Managed Care – HMO | Admitting: Physical Therapy

## 2012-11-07 ENCOUNTER — Ambulatory Visit: Payer: BC Managed Care – HMO | Attending: Neurology | Admitting: Occupational Therapy

## 2012-11-07 DIAGNOSIS — M6281 Muscle weakness (generalized): Secondary | ICD-10-CM | POA: Insufficient documentation

## 2012-11-07 DIAGNOSIS — R279 Unspecified lack of coordination: Secondary | ICD-10-CM | POA: Insufficient documentation

## 2012-11-07 DIAGNOSIS — Z5189 Encounter for other specified aftercare: Secondary | ICD-10-CM | POA: Insufficient documentation

## 2012-11-07 DIAGNOSIS — R269 Unspecified abnormalities of gait and mobility: Secondary | ICD-10-CM | POA: Insufficient documentation

## 2012-11-09 ENCOUNTER — Ambulatory Visit: Payer: BC Managed Care – HMO | Admitting: Occupational Therapy

## 2012-11-09 ENCOUNTER — Ambulatory Visit: Payer: BC Managed Care – HMO | Admitting: Physical Therapy

## 2012-11-14 ENCOUNTER — Ambulatory Visit: Payer: BC Managed Care – HMO | Admitting: Occupational Therapy

## 2012-11-14 ENCOUNTER — Ambulatory Visit: Payer: BC Managed Care – HMO | Admitting: Physical Therapy

## 2012-11-16 ENCOUNTER — Ambulatory Visit: Payer: BC Managed Care – HMO | Admitting: Physical Therapy

## 2012-11-16 ENCOUNTER — Ambulatory Visit: Payer: BC Managed Care – HMO | Admitting: Occupational Therapy

## 2012-11-21 ENCOUNTER — Ambulatory Visit: Payer: BC Managed Care – HMO | Admitting: Physical Therapy

## 2012-11-23 ENCOUNTER — Ambulatory Visit: Payer: BC Managed Care – HMO | Admitting: Physical Therapy

## 2012-11-28 ENCOUNTER — Ambulatory Visit: Payer: BC Managed Care – HMO | Admitting: Physical Therapy

## 2012-11-30 ENCOUNTER — Ambulatory Visit: Payer: BC Managed Care – HMO | Admitting: Physical Therapy

## 2012-12-06 ENCOUNTER — Ambulatory Visit (INDEPENDENT_AMBULATORY_CARE_PROVIDER_SITE_OTHER): Payer: BC Managed Care – PPO | Admitting: Neurology

## 2012-12-06 ENCOUNTER — Encounter: Payer: Self-pay | Admitting: Neurology

## 2012-12-06 VITALS — BP 148/84 | HR 56 | Ht 64.0 in | Wt 182.0 lb

## 2012-12-06 DIAGNOSIS — R269 Unspecified abnormalities of gait and mobility: Secondary | ICD-10-CM

## 2012-12-06 DIAGNOSIS — R5381 Other malaise: Secondary | ICD-10-CM

## 2012-12-06 DIAGNOSIS — E079 Disorder of thyroid, unspecified: Secondary | ICD-10-CM

## 2012-12-06 DIAGNOSIS — Z86018 Personal history of other benign neoplasm: Secondary | ICD-10-CM

## 2012-12-06 DIAGNOSIS — R531 Weakness: Secondary | ICD-10-CM

## 2012-12-06 DIAGNOSIS — Z8669 Personal history of other diseases of the nervous system and sense organs: Secondary | ICD-10-CM

## 2012-12-06 DIAGNOSIS — F0390 Unspecified dementia without behavioral disturbance: Secondary | ICD-10-CM

## 2012-12-06 MED ORDER — MEMANTINE HCL 10 MG PO TABS
10.0000 mg | ORAL_TABLET | Freq: Two times a day (BID) | ORAL | Status: DC
Start: 2012-12-06 — End: 2013-08-21

## 2012-12-06 NOTE — Patient Instructions (Signed)
I think overall you are doing fairly well but I do want to suggest a few things today:  Remember to drink plenty of fluid, eat healthy meals and do not skip any meals. Try to eat protein with a every meal and eat a healthy snack such as fruit or nuts in between meals. Try to keep a regular sleep-wake schedule and try to exercise daily, particularly in the form of walking, 20-30 minutes a day, if you can.   Engage in social activities in your community and with your family and try to keep up with current events by reading the newspaper or watching the news.   As far as your medications are concerned, I would like to suggest no new medications.    As far as diagnostic testing: no new test at this time.  I would like to see you back in 4 months, sooner if we need to. Please call us with any interim questions, concerns, problems, updates or refill requests.  Please also call us for any test results so we can go over those with you on the phone. Brett Canales is my clinical assistant and will answer any of your questions and relay your messages to me and also relay most of my messages to you.  Our phone number is [phone]. We also have an after hours call service for urgent matters and there is a physician on-call for urgent questions. For any emergencies you know to call 911 or go to the nearest emergency room.

## 2012-12-06 NOTE — Progress Notes (Signed)
Subjective:    Patient ID: Abigail Powell is a 62 y.o. female.  HPI  Interim history:   Abigail Powell is a very pleasant 62 year old right-handed woman who presents for followup consultation of her memory loss and recent diagnoses of Guillain-Barr syndrome. She is accompanied by her daughter again today. I first met her on 06/08/2012 at which time her daughter reported that Abigail Powell was diagnosed with Guillain-Barr syndrome in October 2013 and was diagnosed with dementia shortly thereafter. She went through rehab for her GBS.  She had had a UTI in September of last year treated with antibiotics and then had to have a cholecystectomy in October 2013 after which he gradually developed weakness. She was treated with IVIG for 5 days and had workup including CSF studies at [facility]. I then saw her back on 09/06/2012 and reviewed prior test results with her. She has been on Namenda. Head CT from October 2013 showed mild cerebral and cerebellar atrophy. She otherwise has a history of reflux disease, hypertension, hyperlipidemia, insomnia, hyperthyroidism, diverticulosis, gastric ulcer, pneumonia, anemia, cervical degenerative disc disease and orbital tumor for which she had eye surgery at [facility]. She has a remote history of alcohol abuse and I felt that her memory loss was due to a combination of things including metabolic encephalopathy, thyroid dysfunction, prior history of alcohol abuse. At her followup visit in April I felt she was less confused but still not fully oriented. She had recently had adjustment in her thyroid medicine. I did not suggest any new medications and wanted to follow her clinically. She finished PT/OT outpt recently and I reviewed the D/C summary from rehab. She has residual deficit and reached max. Rehab potential.  Her Past Medical History Is Significant For: Past Medical History  Diagnosis Date  . Diverticulosis   . Hypertension   . Benign tumor of eye     Behind  the left eye, unclear if benign  . H/O gastric ulcer   . Hypopotassemia   . Muscle weakness (generalized)   . Lack of coordination   . Symbolic dysfunction, unspecified   . Essential hypertension, benign   . Other and unspecified hyperlipidemia   . Thyrotoxicosis without mention of goiter or other cause, without mention of thyrotoxic crisis or storm   . Reflux esophagitis   . GERD (gastroesophageal reflux disease)   . Insomnia, unspecified   . Polyneuropathy in diabetes(357.2)     History of   . Pneumonia, organism unspecified   . Benign neoplasm of eye, part unspecified   . Alcohol abuse, unspecified   . Other quadriplegia and quadriparesis   . Pneumonia, organism unspecified 2013    History of pneumonia  . Adult failure to thrive   . Hx of Guillain-Barre syndrome 09/06/2012  . Memory loss 09/06/2012    Her Past Surgical History Is Significant For: Past Surgical History  Procedure Laterality Date  . Cholecystectomy  02/26/2012    Procedure: LAPAROSCOPIC CHOLECYSTECTOMY WITH INTRAOPERATIVE CHOLANGIOGRAM;  Surgeon: Lodema Pilot, DO;  Location: [facility];  Service: General;  Laterality: N/A;  . Abdominal hysterectomy    . Orthoscopic knee surgery    . Abdominal hysterectomy    . Tumor removal Left 08/07/12    behind left eye    Her Family History Is Significant For: Family History  Problem Relation Age of Onset  . Ovarian cancer    . Prostate cancer    . Hypertension      Her Social History Is Significant For:  History   Social History  . Marital Status: Legally Separated    Spouse Name: N/A    Number of Children: N/A  . Years of Education: N/A   Social History Main Topics  . Smoking status: Never Smoker   . Smokeless tobacco: Never Used  . Alcohol Use: Yes     Comment: pt's daughter reports pt drinks beer and scotch every day keeping a thermos filled all day; unsure of how much    . Drug Use: No  . Sexually Active:    Other Topics Concern  . None   Social  History Narrative  . None    Her Allergies Are:  Allergies  Allergen Reactions  . Phenergan (Promethazine Hcl) Shortness Of Breath  . Asa (Aspirin) Other (See Comments)    Causes ulcer  . Zofran (Ondansetron Hcl) Other (See Comments)    Unknown reaction per pt  :   Her Current Medications Are:  Outpatient Encounter Prescriptions as of 12/06/2012  Medication Sig Dispense Refill  . Cholecalciferol (VITAMIN D PO) Take 1 tablet by mouth daily.      . fish oil-omega-3 fatty acids 1000 MG capsule Take 1 capsule (1 g total) by mouth daily.  30 capsule  3  . gemfibrozil (LOPID) 600 MG tablet Take 1 tablet (600 mg total) by mouth 2 (two) times daily before a meal.  60 tablet  3  . methimazole (TAPAZOLE) 10 MG tablet       . metoprolol succinate (TOPROL-XL) 100 MG 24 hr tablet Take 1 tablet (100 mg total) by mouth daily. Take with or immediately following a meal.  30 tablet  3  . NAMENDA 10 MG tablet       . VITAMIN E PO Take 1 capsule by mouth daily.      . [DISCONTINUED] amLODipine-olmesartan (AZOR) 5-20 MG per tablet Take 1 tablet by mouth daily.  30 tablet  3  . [DISCONTINUED] feeding supplement (RESOURCE BREEZE) LIQD Take 1 Container by mouth 3 (three) times daily between meals.  90 Container  3  . [DISCONTINUED] HYDROcodone-acetaminophen (NORCO/VICODIN) 5-325 MG per tablet Take 1-2 tablets by mouth every 4 (four) hours as needed. Pain  65 tablet  0  . [DISCONTINUED] pantoprazole (PROTONIX) 40 MG tablet Take 40 mg by mouth daily.      . [DISCONTINUED] zolpidem (AMBIEN) 5 MG tablet Take 5 mg by mouth at bedtime as needed. For sleep       No facility-administered encounter medications on file as of 12/06/2012.  : Review of Systems  Allergic/Immunologic: Positive for environmental allergies.  Neurological: Positive for weakness.       Memory loss    Objective:  Neurologic Exam  Physical Exam Physical Examination:   Filed Vitals:   12/06/12 1556  BP: 148/84  Pulse: 56   General  Examination: The patient is a very pleasant 62 y.o. female in no acute distress. She is calm and cooperative with the exam. She denies Auditory Hallucinations and Visual Hallucinations.  HEENT exam: Pupils are reactive to light. She has bilateral proptosis and mild exophthalmos, better than last time and again more prominent on the left. Extraocular tracking is mildly limited. She has decrease in vision in the left eye particularly. Face is symmetric with normal facial animation. Speech is clear. Neck is supple with full range of motion. Hearing is intact. Oropharynx is clear. Swallowing mechanism is preserved. There is no drooling. L eye is slightly deviated out. No nystagmus. Chest is clear to auscultation  bilaterally with no crackles or wheezing noted. Heart sounds are normal without murmurs, rubs or gallops. Abdomen is soft, nontender with normal bowel sounds appreciated. She has no pitting edema in the distal lower extremities bilaterally. Skin is warm and dry. Neurologically: Mental status: The patient is awake, alert and oriented in in terms of situation, doctor's office, day of the week but not exact date. Her memory, attention, language and knowledge are impaired. She is able to answer questions appropriately but is not able to provide much in the way of concise history. She is less confused about dates and circumstances. Her MMSE is [id number] (was 22 in 1/14), CDT is 4/4, AFT is 10.  Cranial nerves are as described under HEENT exam. Motor exam: Normal bulk and tone is noted. Strength is globally 4/5. Reflexes are 1+ in the UEs and absent in the ankles. Sensory exam is intact and unchanged. Fine motor skills are fairly intact. She has no truncal or gait ataxia. She does walk with the help of a walker which is too wheeled but maneuvers it well.  Assessment and Plan:    In summary, Abigail Powell is a 62 yo RH woman, who had a recent Dx and treatment of GBS. She was also diagnosed with new onset dementia. On  exam, she has ongoing mild global weakness, but has improved and finished therapy and continues to mobilize with a 2 wheeled walker. She has undergone surgery for left orbital tumor, and her MMSE is stable, actually 1 point better than in January. As far as her memory loss is concerned she seems less confused today but is still not fully oriented. She has had a recent adjustment in her thyroid medicine and has a FU with her endocrinologist in 1 month. I do want to hold off on any further formal memory testing or adding a second agent at this time. I would like to see her back in 4 months and suggested no new medicines. I renewed her namenda Rx today. I would like for them to call with any questions concerns or problems or updates. She demonstrated understanding and she and her daughter were in agreement.

## 2012-12-18 ENCOUNTER — Ambulatory Visit
Admission: RE | Admit: 2012-12-18 | Discharge: 2012-12-18 | Disposition: A | Discharge status: Home / Self Care | Payer: BC Managed Care – HMO | Source: Ambulatory Visit | Attending: Internal Medicine | Admitting: Internal Medicine

## 2012-12-18 DIAGNOSIS — Z1231 Encounter for screening mammogram for malignant neoplasm of breast: Secondary | ICD-10-CM

## 2013-02-01 ENCOUNTER — Other Ambulatory Visit: Payer: Self-pay | Admitting: Gastroenterology

## 2013-02-05 ENCOUNTER — Ambulatory Visit: Payer: BC Managed Care – HMO | Admitting: Gastroenterology

## 2013-04-17 ENCOUNTER — Ambulatory Visit: Payer: BC Managed Care – PPO | Admitting: Neurology

## 2013-08-21 ENCOUNTER — Encounter: Payer: Self-pay | Admitting: Neurology

## 2013-08-21 ENCOUNTER — Ambulatory Visit (INDEPENDENT_AMBULATORY_CARE_PROVIDER_SITE_OTHER): Payer: BC Managed Care – PPO | Admitting: Neurology

## 2013-08-21 ENCOUNTER — Encounter (INDEPENDENT_AMBULATORY_CARE_PROVIDER_SITE_OTHER): Payer: Self-pay

## 2013-08-21 VITALS — BP 118/84 | HR 72 | Ht 64.0 in | Wt 181.0 lb

## 2013-08-21 DIAGNOSIS — R269 Unspecified abnormalities of gait and mobility: Secondary | ICD-10-CM

## 2013-08-21 DIAGNOSIS — Z86018 Personal history of other benign neoplasm: Secondary | ICD-10-CM

## 2013-08-21 DIAGNOSIS — Z8669 Personal history of other diseases of the nervous system and sense organs: Secondary | ICD-10-CM

## 2013-08-21 DIAGNOSIS — E079 Disorder of thyroid, unspecified: Secondary | ICD-10-CM

## 2013-08-21 DIAGNOSIS — R413 Other amnesia: Secondary | ICD-10-CM

## 2013-08-21 MED ORDER — MEMANTINE HCL ER 21 MG PO CP24: 21.0000 mg | ORAL_CAPSULE | Freq: Every day | ORAL | Status: DC

## 2013-08-21 NOTE — Progress Notes (Signed)
Subjective:    Patient ID: Abigail Powell is a 63 y.o. female.  HPI    Interim history:   Abigail Powell is a 63 year old right-handed woman who presents for followup consultation of her memory loss and prior diagnosis of Guillain-Barr syndrome. She is accompanied by her daughter again today. I last saw her on 12/06/2012, at which time her MMSE was 23/30, clock drawing was 4 out of 4 and animal fluency was 10. Her weakness had improved and she was mobilizing with a 2 wheeled walker. 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 I felt that she was overall stable or improved with regards to her weakness in her memory but she also has a history of thyroid disease and adjustments to her thyroid medications. I suggested she continue with her Namenda.   Today, her daughter reports, that she is no longer on Tapazole for her thyroid disease and she is going to be on radioiodine treatment per Dr. Chalmers Cater. She has been stable with her memory and mobilizes with a cane. She has not fallen recently. She has had no recent illness or other medication changes. Her daughter feels that her memory is stable.  I first met her on 06/08/2012 at which time her daughter reported that the patient was diagnosed with Guillain-Barr syndrome in October 2013 and was diagnosed with dementia shortly thereafter. She went through rehab for her GBS.  She had had a UTI in September of last year treated with antibiotics and then had to have a cholecystectomy in October 2013 after which he gradually developed weakness. She was treated with IVIG for 5 days and had workup including CSF studies at [facility]. I then saw her back on 09/06/2012 and reviewed prior test results with her. She has been on Namenda. Head CT from October 2013 showed mild cerebral and cerebellar atrophy. She otherwise has a history of reflux disease, hypertension, hyperlipidemia, insomnia, hyperthyroidism, diverticulosis, gastric ulcer,  pneumonia, anemia, cervical degenerative disc disease and orbital tumor for which she had eye surgery at [facility]. She has a remote history of alcohol abuse and I felt that her memory loss was due to a combination of things including metabolic encephalopathy, thyroid dysfunction, prior history of alcohol abuse. At her followup visit in April I felt she was less confused but still not fully oriented. She had recently had adjustment in her thyroid medicine. I did not suggest any new medications and wanted to follow her clinically. She finished PT/OT outpt and I reviewed the D/C summary from rehab. She has residual deficit and reached max. Rehab potential.    Her Past Medical History Is Significant For: Past Medical History  Diagnosis Date  . Diverticulosis   . Hypertension   . 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 H/O gastric ulcer   . Hypopotassemia   . Muscle weakness (generalized)   . Lack of coordination   . Symbolic dysfunction, unspecified   . Essential hypertension, benign   . Other and unspecified hyperlipidemia   . Thyrotoxicosis without mention of goiter or other cause, without mention of thyrotoxic crisis or storm   . GERD (gastroesophageal reflux disease)   . Insomnia, unspecified   . Polyneuropathy in diabetes(357.2)     History of   . Pneumonia, organism unspecified   . Benign neoplasm of eye, part unspecified   . Alcohol abuse, unspecified   . Other quadriplegia and quadriparesis   .  Pneumonia, organism unspecified 2013    History of pneumonia  . Adult failure to thrive   . Hx of Guillain-Barre syndrome 09/06/2012  . Memory loss 09/06/2012    Her Past Surgical History Is Significant For: Past Surgical History  Procedure Laterality Date  . Cholecystectomy  02/26/2012    Procedure: LAPAROSCOPIC CHOLECYSTECTOMY WITH INTRAOPERATIVE CHOLANGIOGRAM;  Surgeon: Madilyn Hook, DO;  Location: [facility];  Service: General;  Laterality: N/A;  .  Abdominal hysterectomy    . Orthoscopic knee surgery    . Abdominal hysterectomy    . Tumor removal Left 08/07/12    behind left eye    Her Family History Is Significant For: Family History  Problem Relation Age of Onset  . Ovarian cancer Mother   . Prostate cancer Father   . Hypertension      Her Social History Is Significant For: History   Social History  . Marital Status: Legally Separated    Spouse Name: N/A    Number of Children: N/A  . Years of Education: N/A   Social History Main Topics  . Smoking status: Never Smoker   . Smokeless tobacco: Never Used  . Alcohol Use: Yes     Comment: pt's daughter reports pt drinks beer and scotch every day keeping a thermos filled all day; unsure of how much    . Drug Use: No  . Sexual Activity:    Other Topics Concern  . None   Social History Narrative  . None    Her Allergies Are:  Allergies  Allergen Reactions  . Phenergan [Promethazine Hcl] Shortness Of Breath  . Asa [Aspirin] Other (See Comments)    Causes ulcer  . Zofran [Ondansetron Hcl] Other (See Comments)    Unknown reaction per pt  :   Her Current Medications Are:  Outpatient Encounter Prescriptions as of 08/21/2013  Medication Sig  . Cholecalciferol (VITAMIN D PO) Take 1 tablet by mouth daily.  . fish oil-omega-3 fatty acids 1000 MG capsule Take 1 capsule (1 g total) by mouth daily.  Marland Kitchen gemfibrozil (LOPID) 600 MG tablet Take 1 tablet (600 mg total) by mouth 2 (two) times daily before a meal.  . memantine (NAMENDA) 10 MG tablet Take 1 tablet (10 mg total) by mouth 2 (two) times daily.  . metoprolol succinate (TOPROL-XL) 100 MG 24 hr tablet Take 1 tablet (100 mg total) by mouth daily. Take with or immediately following a meal.  . VITAMIN E PO Take 1 capsule by mouth daily.  . [DISCONTINUED] methimazole (TAPAZOLE) 10 MG tablet   :  Review of Systems:  Out of a complete 14 point review of systems, all are reviewed and negative with the exception of these  symptoms as listed below:  Review of Systems  Constitutional: Negative.   HENT: Negative.   Eyes: Negative.   Respiratory: Negative.   Cardiovascular: Negative.   Gastrointestinal: Negative.   Endocrine: Negative.   Genitourinary: Negative.   Musculoskeletal: Negative.   Skin: Negative.   Allergic/Immunologic: Negative.   Neurological:       Memory loss  Hematological: Negative.   Psychiatric/Behavioral: Positive for sleep disturbance (insomnia).    Objective:  Neurologic Exam  Physical Exam Physical Examination:   Filed Vitals:   08/21/13 1517  BP: 118/84  Pulse: 72    General Examination: The patient is a very pleasant 63 y.o. female in no acute distress. She is calm and cooperative with the exam. She denies Auditory Hallucinations and Visual Hallucinations. She is in  good spirits today.  HEENT exam: Pupils are reactive to light. She has bilateral proptosis and mild exophthalmos. Extraocular tracking is mildly limited. She has decrease in vision in the left eye particularly. Face is symmetric with normal facial animation. Speech is clear. Neck is supple with full range of motion. Hearing is intact. Oropharynx is clear. Swallowing mechanism is preserved. There is no drooling. L eye is slightly deviated out. No nystagmus. Chest is clear to auscultation bilaterally with no crackles or wheezing noted. Heart sounds are normal without murmurs, rubs or gallops. Abdomen is soft, nontender with normal bowel sounds appreciated. She has no pitting edema in the distal lower extremities bilaterally. Skin is warm and dry. Neurologically: Mental status: The patient is awake, alert and oriented in in terms of situation, doctor's office, day of the week but not exact date or month or year. Her memory, attention, language and knowledge are impaired. She is able to answer questions appropriately, but again is not able to provide much in the way of concise history. She is less confused about  circumstances. Her MMSE in 7/14: 23/30 (was 22 in 1/14), CDT is 4/4, AFT is 10.  Cranial nerves are as described under HEENT exam. Motor exam: Normal bulk and tone is noted. Strength is globally 4+/5, better. Reflexes are 1+ in the UEs and absent in the knees and ankles. Sensory exam is intact and unchanged. Fine motor skills are fairly intact. She has no truncal or gait ataxia. She walks with a cane.   Assessment and Plan:   In summary, Ms. Oetken is a 63 yo RH woman, with a prior Dx and treatment of GBS with improved findings. She was also diagnosed with dementia with stable findings. On exam, she has ongoing mild global weakness, but has improved and finished therapy and now is able to mobilize with a single prong cane. I have advised her to continue using her cane for safety. She has undergone surgery for left orbital tumor, and is in the process of being treated for her hyperthyroidism. Her memory is stable. I did suggest that we switch her from Namenda 10 mg twice daily 10 Namenda long-acting 21 mg once daily. I would like to see her back in about 6 months from now, sooner if the need arises. She demonstrated understanding and she and her daughter were in agreement.

## 2013-08-21 NOTE — Patient Instructions (Signed)
Your memory is stable. We will switch you to once daily Namenda XR 21 mg daily. Your strength has improved, but please continue to use your cane.

## 2014-02-01 ENCOUNTER — Other Ambulatory Visit: Payer: Self-pay

## 2014-02-01 DIAGNOSIS — Z1231 Encounter for screening mammogram for malignant neoplasm of breast: Secondary | ICD-10-CM

## 2014-02-04 ENCOUNTER — Other Ambulatory Visit: Payer: Self-pay | Admitting: Endocrinology

## 2014-02-04 DIAGNOSIS — E049 Nontoxic goiter, unspecified: Secondary | ICD-10-CM

## 2014-02-11 ENCOUNTER — Ambulatory Visit
Admission: RE | Admit: 2014-02-11 | Discharge: 2014-02-11 | Disposition: A | Discharge status: Home / Self Care | Payer: BC Managed Care – HMO | Source: Ambulatory Visit | Attending: Endocrinology | Admitting: Endocrinology

## 2014-02-11 DIAGNOSIS — E049 Nontoxic goiter, unspecified: Secondary | ICD-10-CM

## 2014-02-15 ENCOUNTER — Ambulatory Visit: Payer: BC Managed Care – HMO

## 2014-02-20 ENCOUNTER — Ambulatory Visit
Admission: RE | Admit: 2014-02-20 | Discharge: 2014-02-20 | Disposition: A | Discharge status: Home / Self Care | Payer: BC Managed Care – HMO | Source: Ambulatory Visit

## 2014-02-20 DIAGNOSIS — Z1231 Encounter for screening mammogram for malignant neoplasm of breast: Secondary | ICD-10-CM

## 2014-02-22 ENCOUNTER — Encounter (INDEPENDENT_AMBULATORY_CARE_PROVIDER_SITE_OTHER): Payer: Self-pay

## 2014-02-22 ENCOUNTER — Ambulatory Visit (INDEPENDENT_AMBULATORY_CARE_PROVIDER_SITE_OTHER): Payer: BC Managed Care – PPO | Admitting: Neurology

## 2014-02-22 ENCOUNTER — Encounter: Payer: Self-pay | Admitting: Neurology

## 2014-02-22 VITALS — BP 144/78 | HR 56 | Temp 97.7°F | Ht 63.0 in | Wt 194.0 lb

## 2014-02-22 DIAGNOSIS — Z8669 Personal history of other diseases of the nervous system and sense organs: Secondary | ICD-10-CM

## 2014-02-22 DIAGNOSIS — E079 Disorder of thyroid, unspecified: Secondary | ICD-10-CM

## 2014-02-22 DIAGNOSIS — R413 Other amnesia: Secondary | ICD-10-CM

## 2014-02-22 DIAGNOSIS — Z86018 Personal history of other benign neoplasm: Secondary | ICD-10-CM

## 2014-02-22 DIAGNOSIS — R269 Unspecified abnormalities of gait and mobility: Secondary | ICD-10-CM

## 2014-02-22 DIAGNOSIS — F0390 Unspecified dementia without behavioral disturbance: Secondary | ICD-10-CM

## 2014-02-22 MED ORDER — MEMANTINE HCL ER 21 MG PO CP24: 21.0000 mg | ORAL_CAPSULE | Freq: Every day | ORAL | Status: DC

## 2014-02-22 NOTE — Patient Instructions (Addendum)
Your strength seems a little better today.   Try to drink more water. Try to exercise regularly and moderation and use her cane at all times.  When you see Dr. Shelia Media next discuss with him increasing the donepezil from 5 mg to 10 mg daily and down the road, we will consider increasing your Namenda XR to 28 mg once daily.  Your memory scores have been stable for me. Follow up in 6 months with me.

## 2014-02-22 NOTE — Progress Notes (Signed)
Subjective:    Patient ID: Abigail Powell is a 63 y.o. female.  HPI    Interim history:   Abigail Powell is a 63 year old right-handed woman with an underlying complicated medical history of hypertension, thyroid disease, reflux disease, insomnia, history of pneumonia, history of prior alcohol abuse, history of nightly very syndrome, diverticulosis, left eye tumor, status post surgery, who presents for followup consultation of Abigail Powell memory loss and prior diagnosis of Guillain-Barr syndrome. She is accompanied by Abigail Powell daughter again today. I last saw Abigail Powell on 08/21/2013, at which time Abigail Powell daughter reported that the patient was no longer on Tapazole for Abigail Powell thyroid disease and she was going to start radioiodine treatment per Dr. Chalmers Cater. She had been stable with Abigail Powell memory and mobilizing with a cane. She had not fallen recently. Abigail Powell memory was stable. I suggested she utilize the cane for safety at all times. I suggested changing Namenda immediate release to Namenda long-acting once daily.  Today, Abigail Powell daughter says that while things have been stable, she seems to be more slow. She has not fallen, she does not drink enough water. She uses Abigail Powell cane. They both feel that Abigail Powell memory has been stable. She was started on Aricept by Dr. Shelia Media. She is on 5 mg. She has been tolerating a long-acting Namenda 21 mg once daily. She has not seen Abigail Powell eye doctor in several months, perhaps a year. She did not have to do radioiodine treatment. She had an ultrasound of Abigail Powell thyroid on 02/11/2014 which showed stable thyroid nodules as compared to approximately 18 months ago. I shared this result with the patient and Abigail Powell daughter today.   I saw Abigail Powell on 12/06/2012, at which time Abigail Powell MMSE was 23/30, clock drawing was 4 out of 4 and animal fluency was 10. Abigail Powell weakness had improved and she was mobilizing with a 2 wheeled walker. 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 I felt that she was overall stable or improved with  regards to Abigail Powell weakness in Abigail Powell memory but she also has a history of thyroid disease and adjustments to Abigail Powell thyroid medications. I suggested she continue with Abigail Powell Namenda.  I first met Abigail Powell on 06/08/2012 at which time Abigail Powell daughter reported that the patient was diagnosed with Guillain-Barr syndrome in October 2013 and was diagnosed with dementia shortly thereafter. She went through rehab for Abigail Powell GBS.  She had had a UTI in September of last year treated with antibiotics and then had to have a cholecystectomy in October 2013 after which he gradually developed weakness. She was treated with IVIG for 5 days and had workup including CSF studies at [facility]. I then saw Abigail Powell back on 09/06/2012 and reviewed prior test results with Abigail Powell. She has been on Namenda. Head CT from October 2013 showed mild cerebral and cerebellar atrophy. She otherwise has a history of reflux disease, hypertension, hyperlipidemia, insomnia, hyperthyroidism, diverticulosis, gastric ulcer, pneumonia, anemia, cervical degenerative disc disease and orbital tumor for which she had eye surgery at [facility]. She has a remote history of alcohol abuse and I felt that Abigail Powell memory loss was due to a combination of things including metabolic encephalopathy, thyroid dysfunction, prior history of alcohol abuse. At Abigail Powell followup visit in April I felt she was less confused but still not fully oriented. She had recently had adjustment in Abigail Powell thyroid medicine. I did not suggest any new medications and wanted to follow Abigail Powell clinically. She finished PT/OT outpt and I reviewed  the D/C summary from rehab. She has residual deficit and reached max. Rehab potential.      Abigail Powell Past Medical History Is Significant For: Past Medical History  Diagnosis Date  . Diverticulosis   . Hypertension   . 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 H/O gastric ulcer   . Hypopotassemia   . Muscle weakness (generalized)   . Lack of coordination    . Symbolic dysfunction, unspecified   . Essential hypertension, benign   . Other and unspecified hyperlipidemia   . Thyrotoxicosis without mention of goiter or other cause, without mention of thyrotoxic crisis or storm   . GERD (gastroesophageal reflux disease)   . Insomnia, unspecified   . Polyneuropathy in diabetes(357.2)     History of   . Pneumonia, organism unspecified   . Benign neoplasm of eye, part unspecified   . Alcohol abuse, unspecified   . Other quadriplegia and quadriparesis   . Pneumonia, organism unspecified 2013    History of pneumonia  . Adult failure to thrive   . Hx of Guillain-Barre syndrome 09/06/2012  . Memory loss 09/06/2012    Abigail Powell Past Surgical History Is Significant For: Past Surgical History  Procedure Laterality Date  . Cholecystectomy  02/26/2012    Procedure: LAPAROSCOPIC CHOLECYSTECTOMY WITH INTRAOPERATIVE CHOLANGIOGRAM;  Surgeon: Madilyn Hook, DO;  Location: [facility];  Service: General;  Laterality: N/A;  . Abdominal hysterectomy    . Orthoscopic knee surgery    . Abdominal hysterectomy    . Tumor removal Left 08/07/12    behind left eye    Abigail Powell Family History Is Significant For: Family History  Problem Relation Age of Onset  . Ovarian cancer Mother   . Prostate cancer Father   . Hypertension      Abigail Powell Social History Is Significant For: History   Social History  . Marital Status: Legally Separated    Spouse Name: N/A    Number of Children: N/A  . Years of Education: N/A   Social History Main Topics  . Smoking status: Never Smoker   . Smokeless tobacco: Never Used  . Alcohol Use: Yes     Comment: pt's daughter reports pt drinks beer and scotch every day keeping a thermos filled all day; unsure of how much    . Drug Use: No  . Sexual Activity:    Other Topics Concern  . None   Social History Narrative  . None    Abigail Powell Allergies Are:  Allergies  Allergen Reactions  . Phenergan [Promethazine Hcl] Shortness Of Breath  . Asa  [Aspirin] Other (See Comments)    Causes ulcer  . Zofran [Ondansetron Hcl] Other (See Comments)    Unknown reaction per pt  :   Abigail Powell Current Medications Are:  Outpatient Encounter Prescriptions as of 02/22/2014  Medication Sig  . Cholecalciferol (VITAMIN D PO) Take 1 tablet by mouth daily.  Marland Kitchen donepezil (ARICEPT) 5 MG tablet daily.  . fish oil-omega-3 fatty acids 1000 MG capsule Take 1 capsule (1 g total) by mouth daily.  Marland Kitchen gemfibrozil (LOPID) 600 MG tablet Take 1 tablet (600 mg total) by mouth 2 (two) times daily before a meal.  . Memantine HCl ER (NAMENDA XR) 21 MG CP24 Take 21 mg by mouth daily.  . metoprolol succinate (TOPROL-XL) 100 MG 24 hr tablet Take 1 tablet (100 mg total) by mouth daily. Take with or immediately following a meal.  . traZODone (DESYREL) 50 MG  tablet daily.  Marland Kitchen VITAMIN E PO Take 1 capsule by mouth daily.  :  Review of Systems:  Out of a complete 14 point review of systems, all are reviewed and negative with the exception of these symptoms as listed below:   Review of Systems  Constitutional: Positive for appetite change.  Neurological:       Insomnia, memory loss  Psychiatric/Behavioral: Positive for confusion.    Objective:  Neurologic Exam  Physical Exam Physical Examination:   Filed Vitals:   02/22/14 1124  BP: 144/78  Pulse: 56  Temp: 97.7 F (36.5 C)    General Examination: The patient is a very pleasant 63 y.o. female in no acute distress. She is calm and cooperative with the exam. She denies Auditory Hallucinations and Visual Hallucinations. She is in good spirits today, Abigail Powell jovial self.   HEENT exam: Pupils are reactive to light. She has bilateral proptosis and mild exophthalmos, but it seems a little worse on the left. Funduscopy is difficult. She has bilateral mild cataracts. Extraocular tracking is mildly limited. She has decrease in vision in the left eye particularly. Face is symmetric with normal facial animation. Speech is clear. Neck  is supple with full range of motion. Hearing is intact. Oropharynx is clear. Swallowing mechanism is preserved. There is no drooling. L eye is slightly deviated out. No nystagmus. Chest is clear to auscultation bilaterally with no crackles or wheezing noted. Heart sounds are normal without murmurs, rubs or gallops. Abdomen is soft, nontender with normal bowel sounds appreciated. She has no pitting edema in the distal lower extremities bilaterally. Skin is warm and dry. Neurologically: Mental status: The patient is awake, alert and oriented in in terms of situation, doctor's office, day of the week but not exact date or month or year. Abigail Powell memory, attention, language and knowledge are impaired. She is able to answer questions appropriately. She is less confused about circumstances.  On 12/06/12: MMSE 23/30 (was 22 in 1/14), CDT 4/4, AFT is 10. On 02/22/2014: MMSE 22/30, CDT: 4/4, AFT 15.  Cranial nerves are as described under HEENT exam. Motor exam: Normal bulk and tone is noted. Strength is globally 5-/5, better than 6 months ago. Reflexes are 1+ in the UEs and absent in the knees and ankles. Sensory exam is intact to light touch, pinprick, temperature sense. Fine motor skills are fairly intact. She has no truncal or gait ataxia. She walks with a cane. She walks slowly and turns in 3 steps. Balance is mildly impaired.  Assessment and Plan:   In summary, Ms. Bazar is a 63 yo RH woman, with an underlying complicated medical history of hypertension, thyroid disease, reflux disease, insomnia, history of pneumonia, history of prior alcohol abuse, history of nightly very syndrome, diverticulosis, left eye tumor, status post surgery, who presents for followup consultation of Abigail Powell memory loss and prior diagnosis of Guillain-Barr syndrome. Abigail Powell memory is stable. She was started on generic Aricept by Abigail Powell primary care provider and tolerates the Aricept at 5 mg strength. They have an appointment with Dr. Shelia Media next week  and are encouraged to discuss increasing the Aricept to 10 mg once daily. We will keep the long-acting Namenda at 21 mg once daily and consider increasing to the full 28 mg down the [place]. She may also be a candidate for the new combination medication Namzaric 10/28 mg in the future. She is encouraged to drink more water, use Abigail Powell cane at all times, and exercise more. She has gained weight. Abigail Powell  strength is improved. She is encouraged to see Abigail Powell eye doctor again because I feel that Abigail Powell proptosis is a little worse. I would like to see Abigail Powell back in about 6 months from now, sooner if the need arises. She demonstrated understanding and she and Abigail Powell daughter were in agreement.

## 2014-05-14 IMAGING — CT CT HEAD W/O CM
2 series · 16 of 30 positions shown, 20 images · non-contrast
Comparison: No priors.

CLINICAL DATA: Fatigue.

CT HEAD WITHOUT CONTRAST
TECHNIQUE: Contiguous axial images were obtained from the base of
the skull through the vertex without contrast.

[Series 2: head w/o · axial · non-contrast · 0.43mm/px · z∈[+64,+184]mm · 13 of 28 slices shown, 17 images]
[im 2/28  brain]
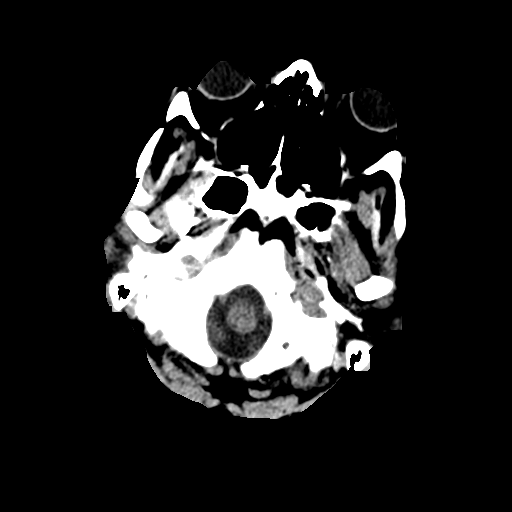
[im 2/28  bone]
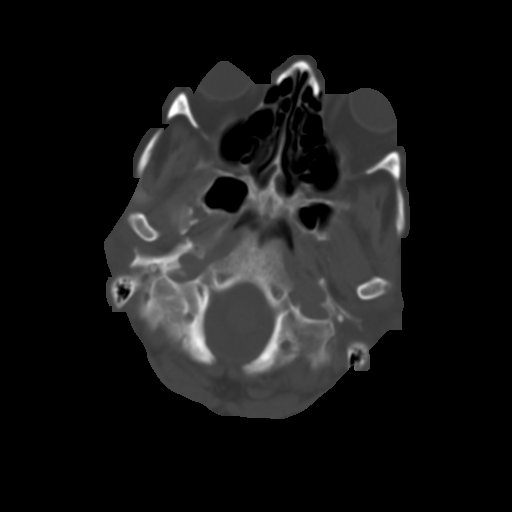
[im 4/28  brain]
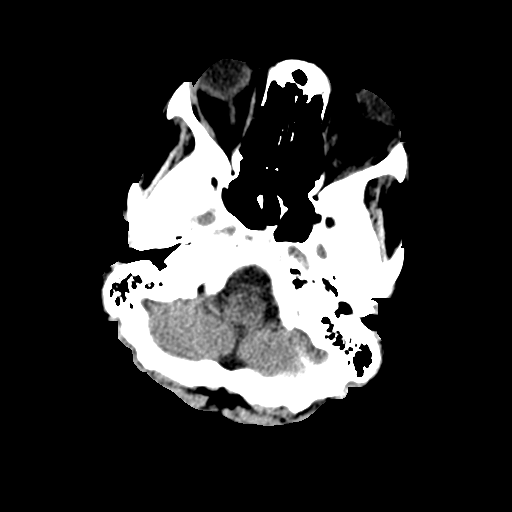
[im 6/28  brain]
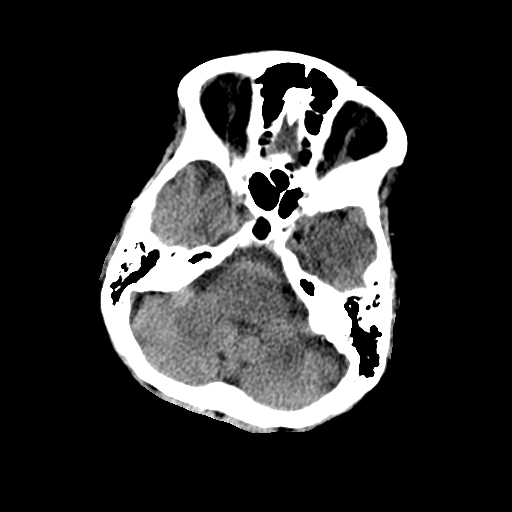
[im 8/28  brain]
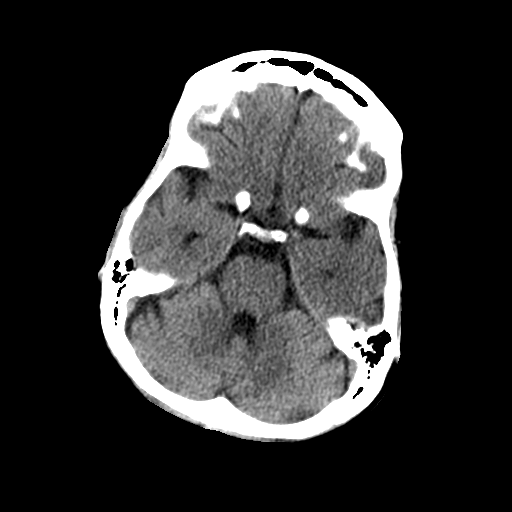
[im 10/28  brain]
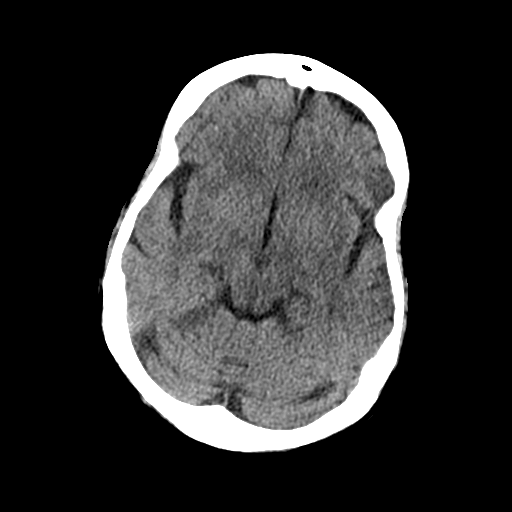
[im 10/28  bone]
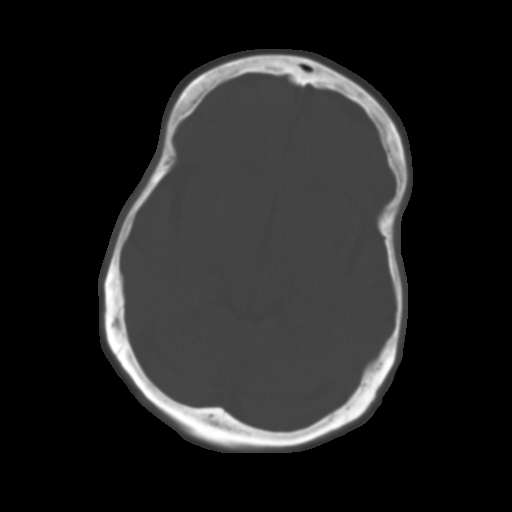
[im 12/28  brain]
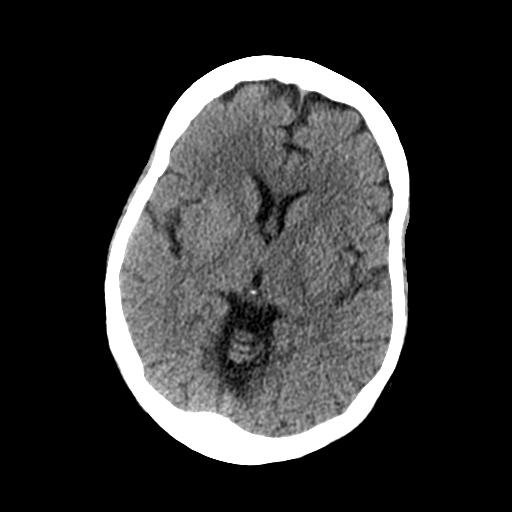
[im 14/28  brain]
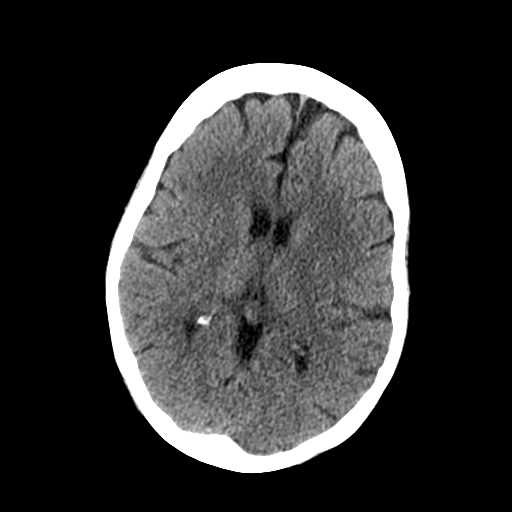
[im 16/28  brain]
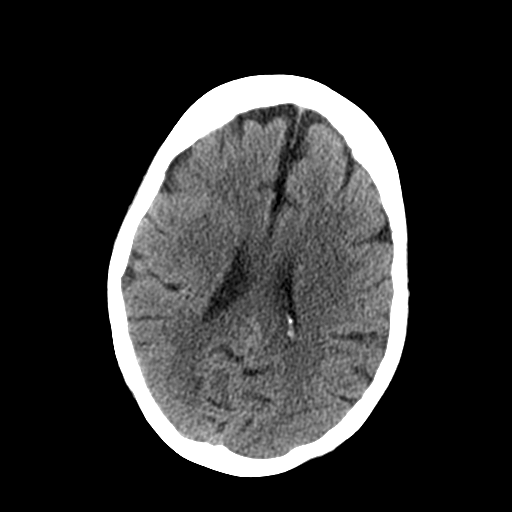
[im 18/28  brain]
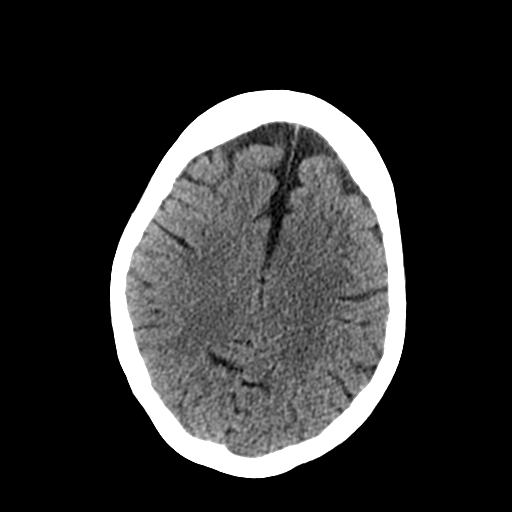
[im 18/28  bone]
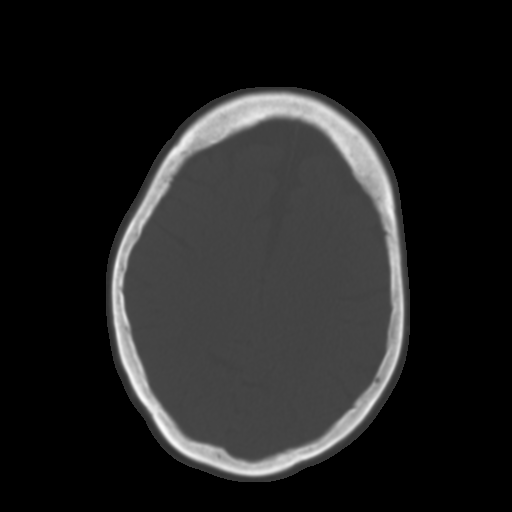
[im 20/28  brain]
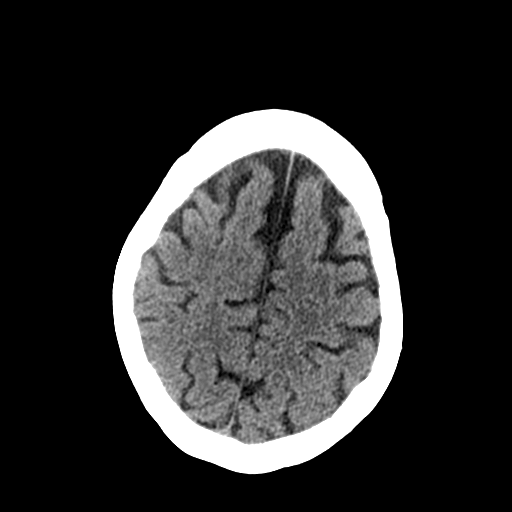
[im 22/28  brain]
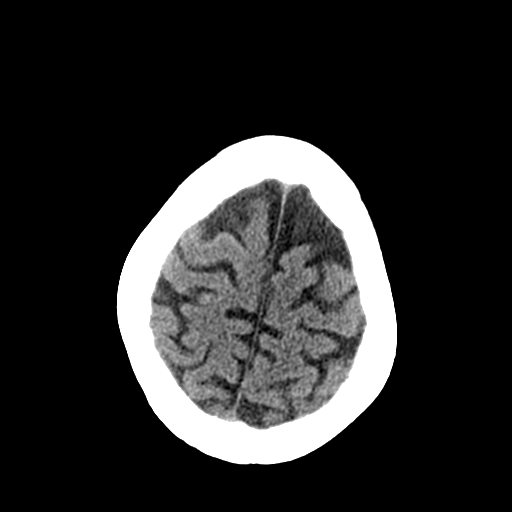
[im 24/28  brain]
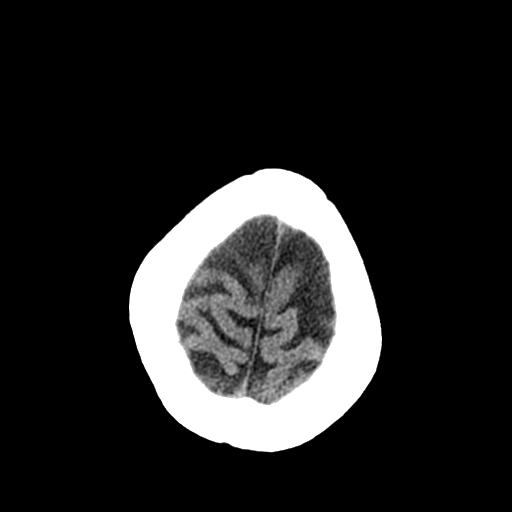
[im 26/28  brain]
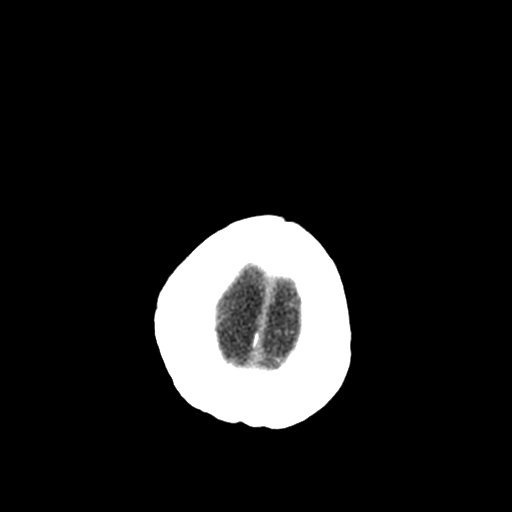
[im 26/28  bone]
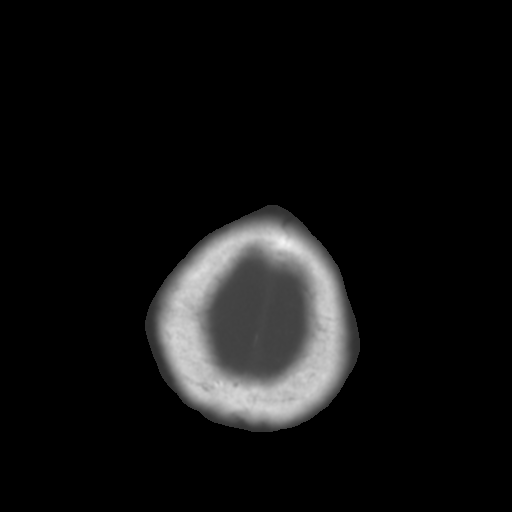

[Series 3: bone windows · axial · 0.43mm/px · z∈[+64,+104]mm · 3 of 28 slices shown]
[im 2/28  bone]
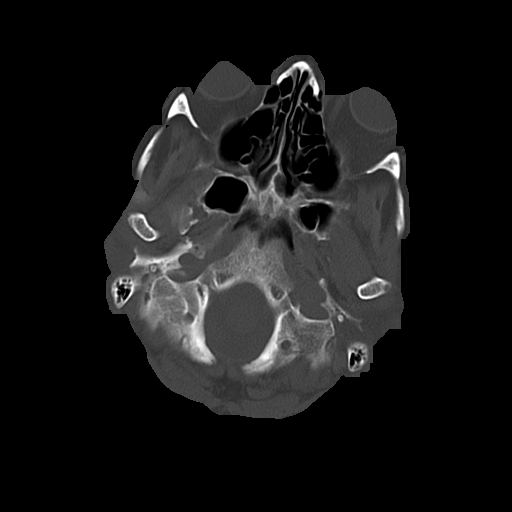
[im 6/28  bone]
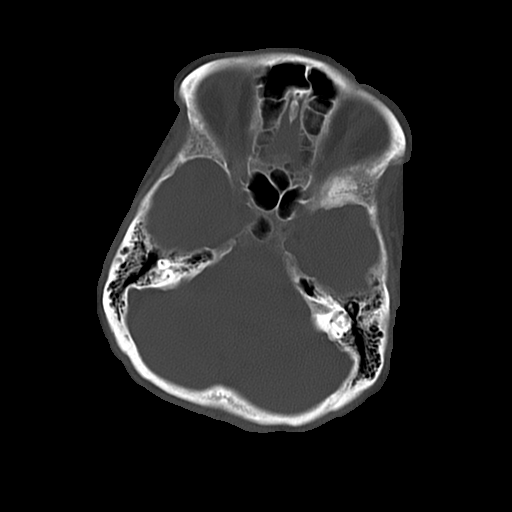
[im 10/28  bone]
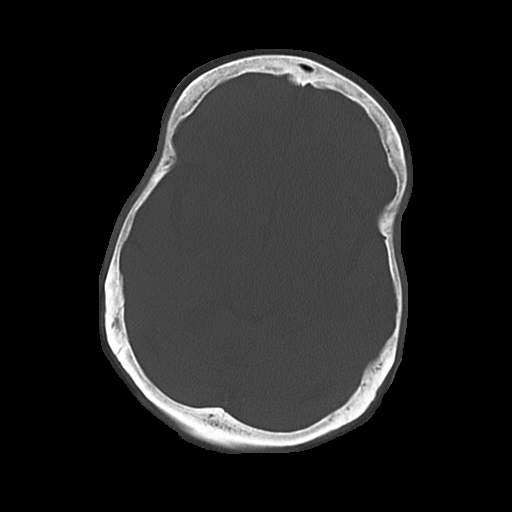

[16 of 30 positions shown; findings below may reference images not displayed]

FINDINGS: No acute intracranial abnormalities.  Specifically, no
definite evidence of vascular territory acute/subacute cerebral
ischemia, acute intracerebral hemorrhage, focal mass, mass effect,
hydrocephalus or abnormal intra or extra-axial fluid collections.
Faint physiologic calcifications are noted in the basal ganglia
bilaterally (right greater than left).  Mild cerebral and
cerebellar atrophy.  No acute displaced skull fractures are
identified.  Visualized paranasal sinuses and mastoids are well
pneumatized.
IMPRESSION: 1.  No acute intracranial abnormalities.
2.  Mild cerebral and cerebellar atrophy.

## 2014-08-01 ENCOUNTER — Telehealth: Payer: Self-pay | Admitting: Neurology

## 2014-08-01 ENCOUNTER — Encounter: Payer: Self-pay | Admitting: *Deleted

## 2014-08-01 NOTE — Telephone Encounter (Signed)
Left VM to call back to reschedule appointment due to template change

## 2014-08-08 IMAGING — CT CT HEAD W/O CM
1 of 2 series · 13 of 30 positions shown, 17 images · non-contrast
Comparison: Head CT 03/01/2012.

CLINICAL DATA: Altered mental status.

CT HEAD WITHOUT CONTRAST
TECHNIQUE: Contiguous axial images were obtained from the base of
the skull through the vertex without contrast.

[Series 2: brain · axial · 0.49mm/px · z∈[+110,+240]mm · 13 of 32 slices shown, 17 images]
[im 3/32  brain]
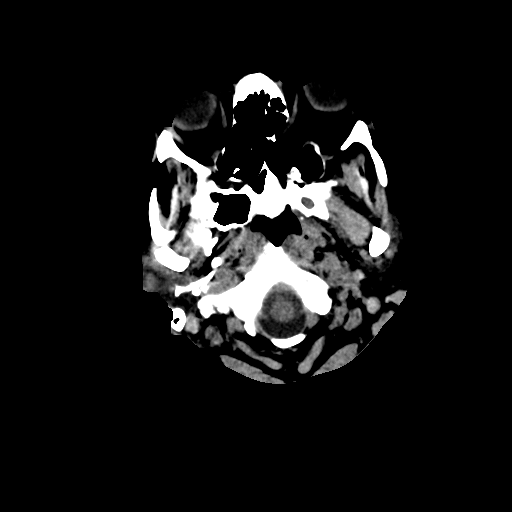
[im 3/32  bone]
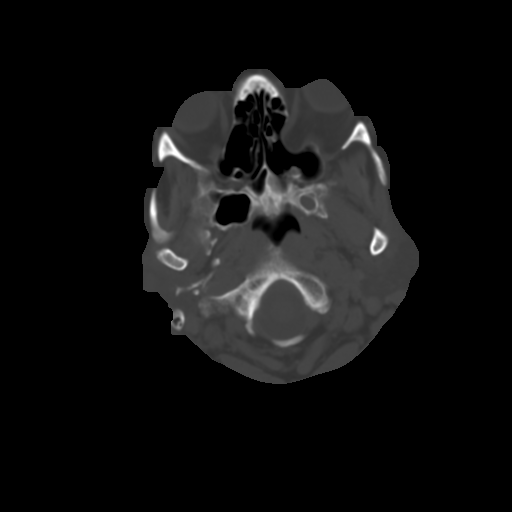
[im 5/32  brain]
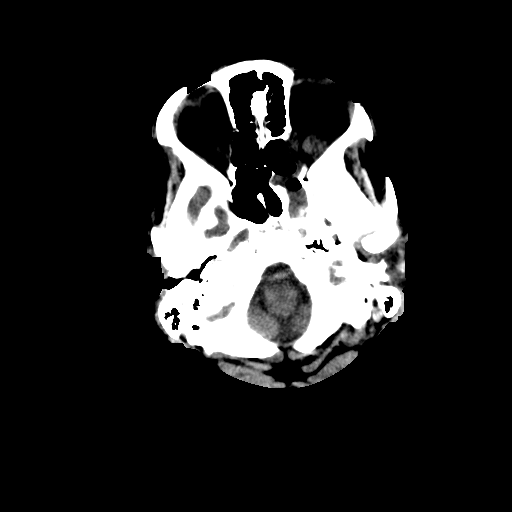
[im 7/32  brain]
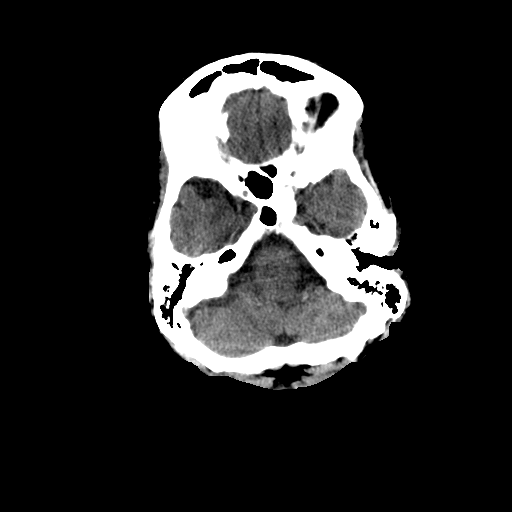
[im 9/32  brain]
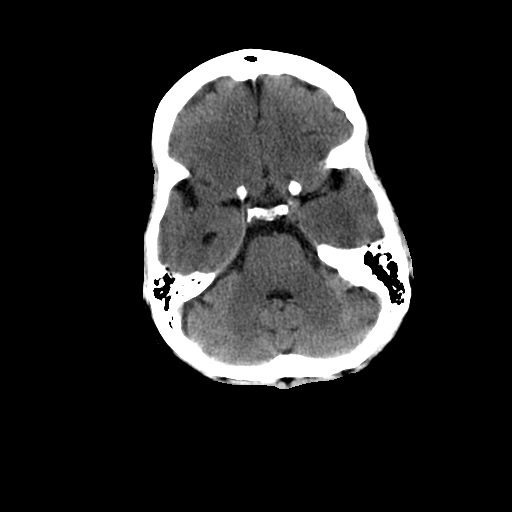
[im 12/32  brain]
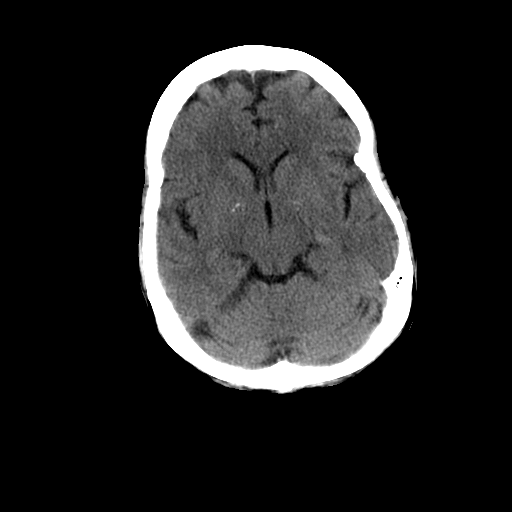
[im 12/32  bone]
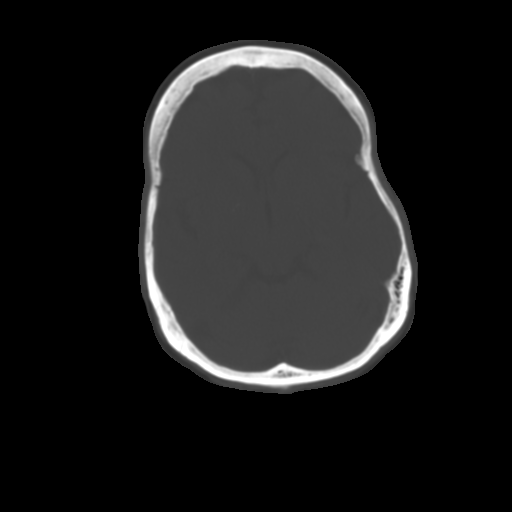
[im 14/32  brain]
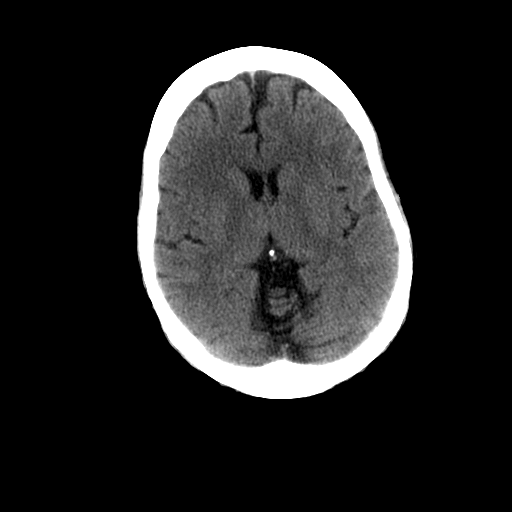
[im 16/32  brain]
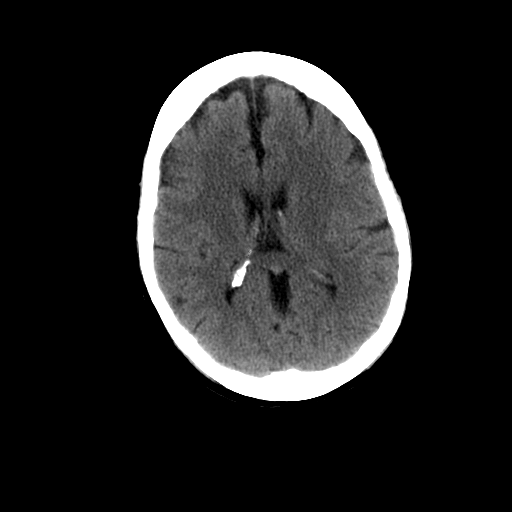
[im 18/32  brain]
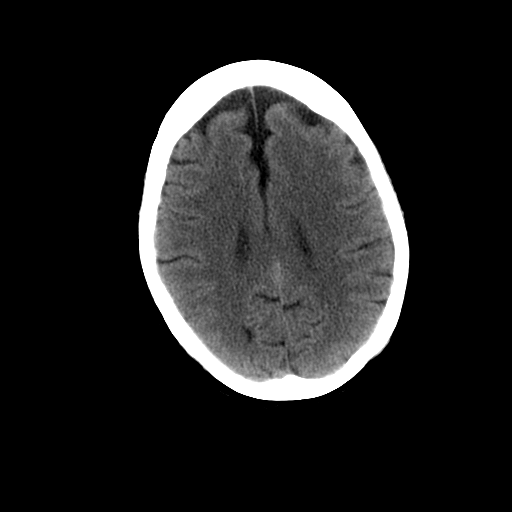
[im 20/32  brain]
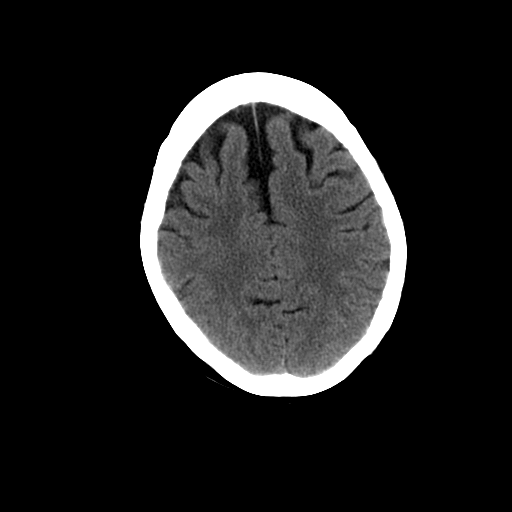
[im 20/32  bone]
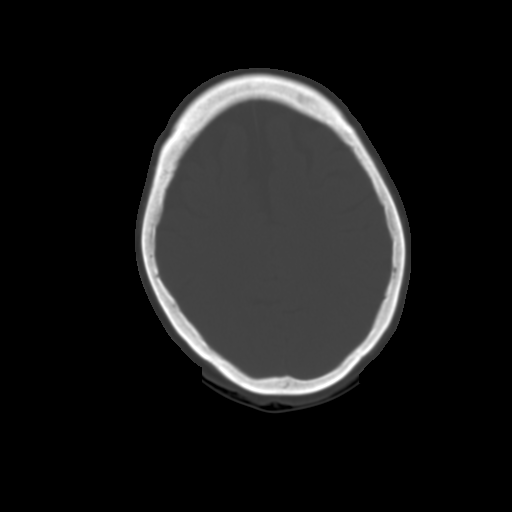
[im 23/32  brain]
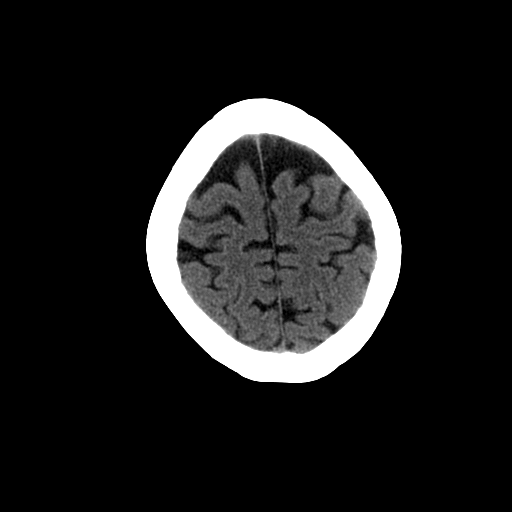
[im 25/32  brain]
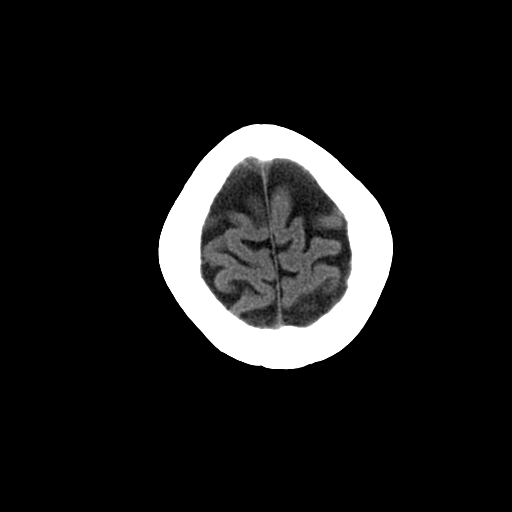
[im 27/32  brain]
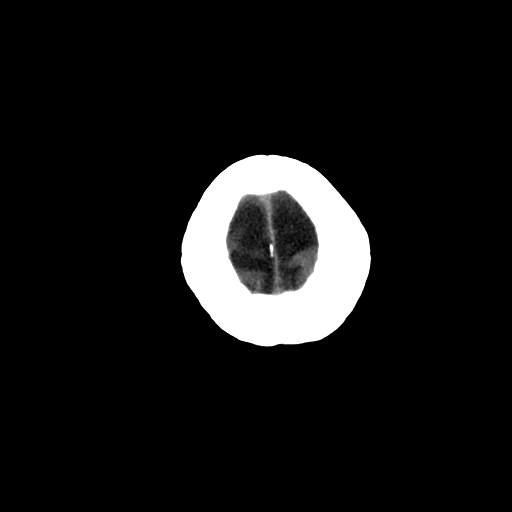
[im 29/32  brain]
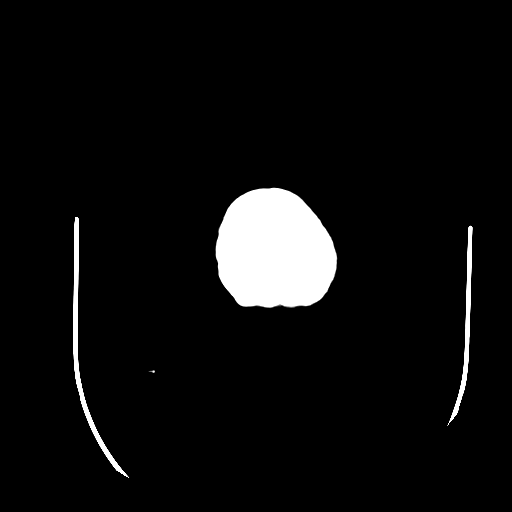
[im 29/32  bone]
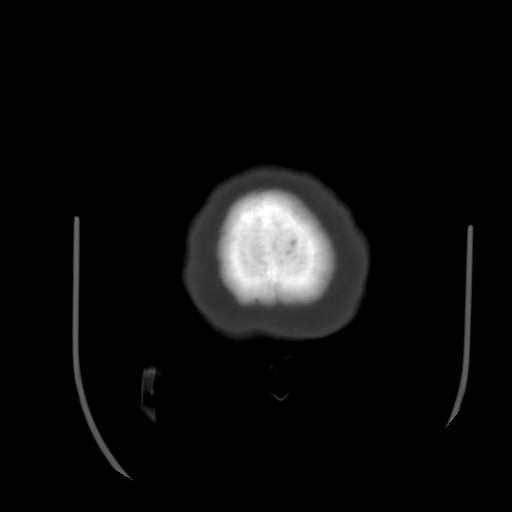

[13 of 30 positions shown; findings below may reference images not displayed]

FINDINGS: The ventricles are normal.  No extra-axial fluid
collections are seen.  The brainstem and cerebellum are
unremarkable.  No acute intracranial findings such as infarction or
hemorrhage.  No mass lesions.  Mild stable cerebral atrophy. A
small left orbital mass is again demonstrated.  A remote blow in
type fracture of the orbital wall is suspected or previous surgical
changes.

No acute bony findings.  Left half sphenoid sinus disease is noted.
IMPRESSION: 1.  Mild stable cerebral atrophy.
2.  No acute intracranial findings or mass lesion.
3.  Stable small left orbital lesion.

## 2014-08-08 IMAGING — CR DG CHEST 1V PORT
1 series · 1 of 1 positions shown · non-contrast
Comparison: 03/12/2012

CLINICAL DATA: Altered mental status

PORTABLE CHEST - 1 VIEW

[AP]
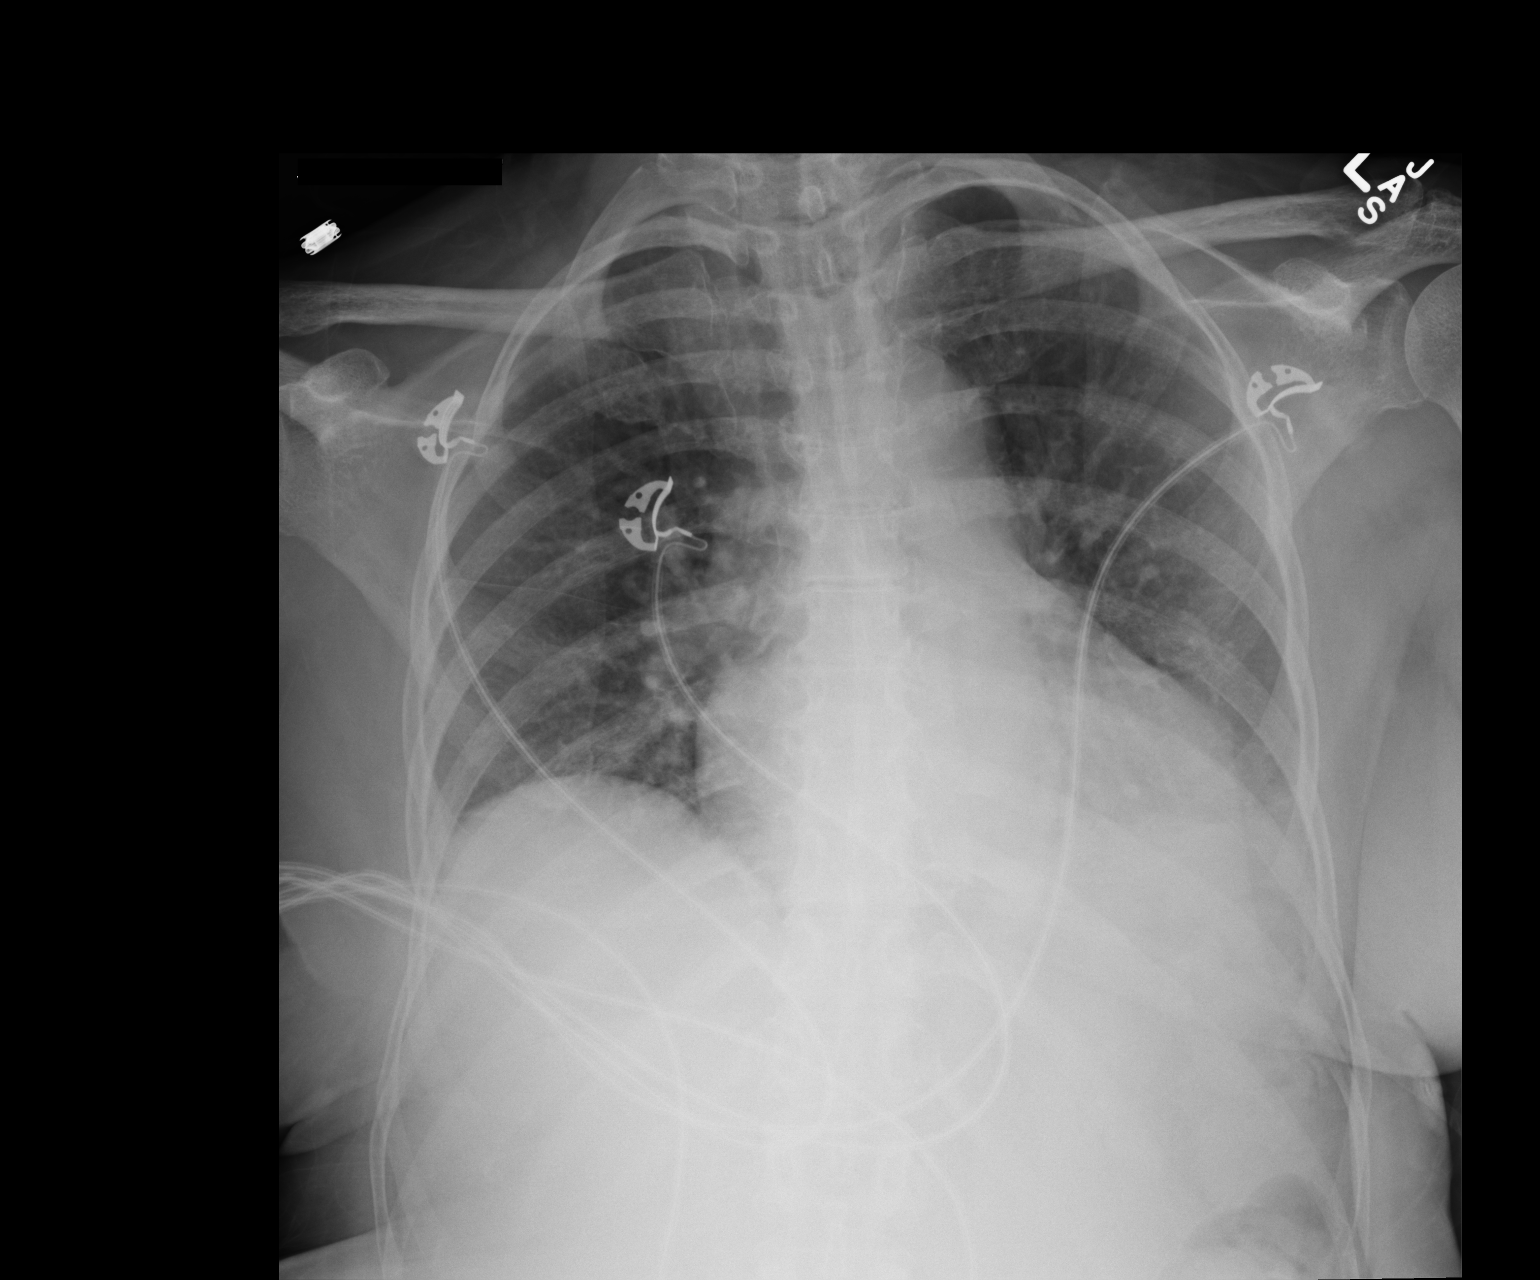

[1 of 1 positions shown; findings below may reference images not displayed]

FINDINGS: Patient rotated right. Cardiomegaly accentuated by AP
portable technique.  No right and no definite left pleural
effusion. No pneumothorax.  Low lung volumes with resultant
pulmonary interstitial prominence.  Patchy bibasilar airspace
disease.
IMPRESSION: Cardiomegaly and low lung volumes with bibasilar atelectasis.

## 2014-08-26 ENCOUNTER — Ambulatory Visit: Payer: BC Managed Care – PPO | Admitting: Neurology

## 2015-10-03 ENCOUNTER — Other Ambulatory Visit: Payer: Self-pay

## 2015-10-03 DIAGNOSIS — Z1231 Encounter for screening mammogram for malignant neoplasm of breast: Secondary | ICD-10-CM

## 2015-10-08 DIAGNOSIS — I10 Essential (primary) hypertension: Secondary | ICD-10-CM | POA: Diagnosis not present

## 2015-10-08 DIAGNOSIS — E7800 Pure hypercholesterolemia, unspecified: Secondary | ICD-10-CM | POA: Diagnosis not present

## 2015-10-08 DIAGNOSIS — Z0000 Encounter for general adult medical examination without abnormal findings: Secondary | ICD-10-CM | POA: Diagnosis not present

## 2015-10-08 DIAGNOSIS — E0590 Thyrotoxicosis, unspecified without thyrotoxic crisis or storm: Secondary | ICD-10-CM | POA: Diagnosis not present

## 2015-10-14 ENCOUNTER — Ambulatory Visit
Admission: RE | Admit: 2015-10-14 | Discharge: 2015-10-14 | Disposition: A | Discharge status: Home / Self Care | Payer: Medicare Other | Source: Ambulatory Visit

## 2015-10-14 DIAGNOSIS — Z1231 Encounter for screening mammogram for malignant neoplasm of breast: Secondary | ICD-10-CM | POA: Diagnosis not present

## 2015-10-15 DIAGNOSIS — M8580 Other specified disorders of bone density and structure, unspecified site: Secondary | ICD-10-CM | POA: Diagnosis not present

## 2015-10-15 DIAGNOSIS — I10 Essential (primary) hypertension: Secondary | ICD-10-CM | POA: Diagnosis not present

## 2015-10-15 DIAGNOSIS — E7800 Pure hypercholesterolemia, unspecified: Secondary | ICD-10-CM | POA: Diagnosis not present

## 2015-10-15 DIAGNOSIS — M859 Disorder of bone density and structure, unspecified: Secondary | ICD-10-CM | POA: Diagnosis not present

## 2016-04-15 DIAGNOSIS — R413 Other amnesia: Secondary | ICD-10-CM | POA: Diagnosis not present

## 2016-04-15 DIAGNOSIS — Z8669 Personal history of other diseases of the nervous system and sense organs: Secondary | ICD-10-CM | POA: Diagnosis not present

## 2016-04-15 DIAGNOSIS — I10 Essential (primary) hypertension: Secondary | ICD-10-CM | POA: Diagnosis not present

## 2016-04-15 DIAGNOSIS — E7800 Pure hypercholesterolemia, unspecified: Secondary | ICD-10-CM | POA: Diagnosis not present

## 2016-04-23 DIAGNOSIS — R413 Other amnesia: Secondary | ICD-10-CM | POA: Diagnosis not present

## 2016-04-23 DIAGNOSIS — E7800 Pure hypercholesterolemia, unspecified: Secondary | ICD-10-CM | POA: Diagnosis not present

## 2016-04-23 DIAGNOSIS — E559 Vitamin D deficiency, unspecified: Secondary | ICD-10-CM | POA: Diagnosis not present

## 2016-04-23 DIAGNOSIS — I10 Essential (primary) hypertension: Secondary | ICD-10-CM | POA: Diagnosis not present

## 2017-10-25 ENCOUNTER — Other Ambulatory Visit: Payer: Self-pay | Admitting: Internal Medicine

## 2017-10-25 DIAGNOSIS — Z1231 Encounter for screening mammogram for malignant neoplasm of breast: Secondary | ICD-10-CM

## 2017-10-26 ENCOUNTER — Ambulatory Visit
Admission: RE | Admit: 2017-10-26 | Discharge: 2017-10-26 | Disposition: A | Discharge status: Home / Self Care | Payer: Medicare HMO | Source: Ambulatory Visit | Attending: Internal Medicine | Admitting: Internal Medicine

## 2017-10-26 DIAGNOSIS — Z1231 Encounter for screening mammogram for malignant neoplasm of breast: Secondary | ICD-10-CM

## 2017-11-24 DIAGNOSIS — E559 Vitamin D deficiency, unspecified: Secondary | ICD-10-CM | POA: Diagnosis not present

## 2017-11-24 DIAGNOSIS — I10 Essential (primary) hypertension: Secondary | ICD-10-CM | POA: Diagnosis not present

## 2017-12-01 DIAGNOSIS — Z0000 Encounter for general adult medical examination without abnormal findings: Secondary | ICD-10-CM | POA: Diagnosis not present

## 2017-12-01 DIAGNOSIS — R7989 Other specified abnormal findings of blood chemistry: Secondary | ICD-10-CM | POA: Diagnosis not present

## 2017-12-01 DIAGNOSIS — R413 Other amnesia: Secondary | ICD-10-CM | POA: Diagnosis not present

## 2017-12-01 DIAGNOSIS — E7800 Pure hypercholesterolemia, unspecified: Secondary | ICD-10-CM | POA: Diagnosis not present

## 2017-12-01 DIAGNOSIS — I10 Essential (primary) hypertension: Secondary | ICD-10-CM | POA: Diagnosis not present

## 2017-12-01 DIAGNOSIS — H6123 Impacted cerumen, bilateral: Secondary | ICD-10-CM | POA: Diagnosis not present

## 2017-12-01 DIAGNOSIS — Z8669 Personal history of other diseases of the nervous system and sense organs: Secondary | ICD-10-CM | POA: Diagnosis not present

## 2017-12-01 DIAGNOSIS — E559 Vitamin D deficiency, unspecified: Secondary | ICD-10-CM | POA: Diagnosis not present

## 2017-12-16 DIAGNOSIS — I10 Essential (primary) hypertension: Secondary | ICD-10-CM | POA: Diagnosis not present

## 2018-01-08 DIAGNOSIS — G459 Transient cerebral ischemic attack, unspecified: Secondary | ICD-10-CM

## 2018-01-08 HISTORY — DX: Transient cerebral ischemic attack, unspecified: G45.9

## 2018-01-29 ENCOUNTER — Encounter (HOSPITAL_COMMUNITY): Payer: Self-pay | Admitting: Emergency Medicine

## 2018-01-29 ENCOUNTER — Other Ambulatory Visit: Payer: Self-pay

## 2018-01-29 ENCOUNTER — Observation Stay (HOSPITAL_COMMUNITY)
Admission: EM | Admit: 2018-01-29 | Discharge: 2018-01-31 | Disposition: A | Discharge status: Home / Self Care | Payer: Medicare HMO | Attending: Internal Medicine | Admitting: Internal Medicine

## 2018-01-29 ENCOUNTER — Emergency Department (HOSPITAL_COMMUNITY): Payer: Medicare HMO

## 2018-01-29 DIAGNOSIS — I10 Essential (primary) hypertension: Secondary | ICD-10-CM | POA: Insufficient documentation

## 2018-01-29 DIAGNOSIS — Z87898 Personal history of other specified conditions: Secondary | ICD-10-CM | POA: Insufficient documentation

## 2018-01-29 DIAGNOSIS — E785 Hyperlipidemia, unspecified: Secondary | ICD-10-CM | POA: Insufficient documentation

## 2018-01-29 DIAGNOSIS — E1142 Type 2 diabetes mellitus with diabetic polyneuropathy: Secondary | ICD-10-CM | POA: Insufficient documentation

## 2018-01-29 DIAGNOSIS — Z8669 Personal history of other diseases of the nervous system and sense organs: Secondary | ICD-10-CM

## 2018-01-29 DIAGNOSIS — E1151 Type 2 diabetes mellitus with diabetic peripheral angiopathy without gangrene: Secondary | ICD-10-CM | POA: Diagnosis not present

## 2018-01-29 DIAGNOSIS — Z8711 Personal history of peptic ulcer disease: Secondary | ICD-10-CM | POA: Diagnosis not present

## 2018-01-29 DIAGNOSIS — E538 Deficiency of other specified B group vitamins: Secondary | ICD-10-CM | POA: Insufficient documentation

## 2018-01-29 DIAGNOSIS — G459 Transient cerebral ischemic attack, unspecified: Secondary | ICD-10-CM | POA: Diagnosis not present

## 2018-01-29 DIAGNOSIS — I160 Hypertensive urgency: Secondary | ICD-10-CM | POA: Insufficient documentation

## 2018-01-29 DIAGNOSIS — R531 Weakness: Secondary | ICD-10-CM | POA: Diagnosis present

## 2018-01-29 DIAGNOSIS — F0390 Unspecified dementia without behavioral disturbance: Secondary | ICD-10-CM | POA: Insufficient documentation

## 2018-01-29 DIAGNOSIS — R413 Other amnesia: Secondary | ICD-10-CM | POA: Diagnosis present

## 2018-01-29 DIAGNOSIS — Z79899 Other long term (current) drug therapy: Secondary | ICD-10-CM | POA: Diagnosis not present

## 2018-01-29 DIAGNOSIS — R69 Illness, unspecified: Secondary | ICD-10-CM | POA: Diagnosis not present

## 2018-01-29 HISTORY — DX: Transient cerebral ischemic attack, unspecified: G45.9

## 2018-01-29 LAB — COMPREHENSIVE METABOLIC PANEL
ALT: 9 U/L (ref 0–44)
ANION GAP: 9 (ref 5–15)
AST: 14 U/L — ABNORMAL LOW (ref 15–41)
Albumin: 4 g/dL (ref 3.5–5.0)
Alkaline Phosphatase: 70 U/L (ref 38–126)
BUN: 18 mg/dL (ref 8–23)
CHLORIDE: 109 mmol/L (ref 98–111)
CO2: 25 mmol/L (ref 22–32)
Calcium: 9.3 mg/dL (ref 8.9–10.3)
Creatinine, Ser: 0.88 mg/dL (ref 0.44–1.00)
GFR calc Af Amer: >60 mL/min (ref >60)
GFR calc non Af Amer: >60 mL/min (ref >60)
Glucose, Bld: 113 mg/dL — ABNORMAL HIGH (ref 70–99)
POTASSIUM: 4.4 mmol/L (ref 3.5–5.1)
Sodium: 143 mmol/L (ref 135–145)
Total Bilirubin: 0.5 mg/dL (ref 0.3–1.2)
Total Protein: 6.7 g/dL (ref 6.5–8.1)

## 2018-01-29 LAB — CBC
HCT: 42.8 % (ref 36.0–46.0)
Hemoglobin: 13.3 g/dL (ref 12.0–15.0)
MCH: 30.9 pg (ref 26.0–34.0)
MCHC: 31.1 g/dL (ref 30.0–36.0)
MCV: 99.5 fL (ref 78.0–100.0)
Platelets: 301 10*3/uL (ref 150–400)
RBC: 4.3 MIL/uL (ref 3.87–5.11)
RDW: 12.2 % (ref 11.5–15.5)
WBC: 6.7 10*3/uL (ref 4.0–10.5)

## 2018-01-29 LAB — DIFFERENTIAL
Abs Immature Granulocytes: 0 10*3/uL (ref 0.0–0.1)
BASOS PCT: 1 %
Basophils Absolute: 0 10*3/uL (ref 0.0–0.1)
EOS ABS: 0.1 10*3/uL (ref 0.0–0.7)
EOS PCT: 2 %
Immature Granulocytes: 0 %
LYMPHS ABS: 2 10*3/uL (ref 0.7–4.0)
Lymphocytes Relative: 30 %
MONO ABS: 0.7 10*3/uL (ref 0.1–1.0)
Monocytes Relative: 11 %
NEUTROS PCT: 56 %
Neutro Abs: 3.8 10*3/uL (ref 1.7–7.7)

## 2018-01-29 LAB — APTT: aPTT: 32 seconds (ref 24–36)

## 2018-01-29 LAB — PROTIME-INR
INR: 1.03
PROTHROMBIN TIME: 13.4 s (ref 11.4–15.2)

## 2018-01-29 LAB — I-STAT TROPONIN, ED: TROPONIN I, POC: 0.01 ng/mL (ref 0.00–0.08)

## 2018-01-29 MED ORDER — LABETALOL HCL 5 MG/ML IV SOLN
5.0000 mg | Freq: Once | INTRAVENOUS | Status: AC
  Administered 2018-01-29: 5 mg via INTRAVENOUS
  Filled 2018-01-29: qty 4

## 2018-01-29 NOTE — ED Provider Notes (Signed)
Patient placed in Quick Look pathway, seen and evaluated   Chief Complaint: stroke symptoms.  HPI: patient's daughter states she noticed pt's R arm hanging down and pt off balance/dragging her R foot around 6pm.  Pt lives with daughter and daughter hasn't noticed weakness prior to 6pm.  Pt has history of dementia-  She says her R shoulder hurts when she holds arm up.    ROS: neuro: weakness  Physical Exam:  BP (!) 214/76 (BP Location: Left Arm)   Pulse 65   Temp 98.2 F (36.8 C) (Oral)   Resp 18   SpO2 99%    Gen: No distress  Neuro: Awake and Alert, grips are equal, no pronator drift. Patient with equal strength lower extremities.   Skin: Warm and dry   Initiation of care has begun. The patient has been counseled on the process, plan, and necessity for staying for the completion/evaluation, and the remainder of the medical screening examination    Ashley Murrain, NP 01/29/18 Kendrick Fries, MD 01/29/18 [phone]

## 2018-01-29 NOTE — ED Triage Notes (Signed)
Daughter states she noticed pt's R arm hanging down and pt off balance/dragging her R foot around 6pm.  Pt lives with daughter and daughter hasn't noticed weakness prior to 6pm.  Pt has history of dementia- alert to person and place.  When asking pt to hold arms up she holds them up and then says ok that is enough and puts them down.  Attempted multiple times to get pt to keep arms up.  She says her R shoulder hurts when she holds arm up.  No drift noted after multiple attempts.  Pt not able to raise leg up very far but no drift noted on exam.

## 2018-01-29 NOTE — ED Provider Notes (Addendum)
[facility] Provider Note   CSN: [id number] Arrival date & time: 01/29/18  1843     History   Chief Complaint Chief Complaint  Patient presents with  . Stroke Symptoms    HPI Abigail Powell is a 67 y.o. female.  Patient presents for evaluation of right-sided weakness.  Patient has dementia, is a poor historian, but she lives with her daughter.  Daughter reports that she noticed that the patient was not using her right arm and was dragging her right leg when she walked around 6 PM.  She was brought to the ER and at time of arrival for triage, deficit was not noted.  Daughter reports improvement in outpatient is back to her baseline.  There was never any speech difficulty or facial drooping.     Past Medical History:  Diagnosis Date  . Adult failure to thrive   . Alcohol abuse, unspecified   . Benign neoplasm of eye, part unspecified   . 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 Diverticulosis   . Essential hypertension, benign   . GERD (gastroesophageal reflux disease)   . H/O gastric ulcer   . Hx of Guillain-Barre syndrome 09/06/2012  . Hypertension   . Hypopotassemia   . Insomnia, unspecified   . Lack of coordination   . Memory loss 09/06/2012  . Muscle weakness (generalized)   . Other and unspecified hyperlipidemia   . Other quadriplegia and quadriparesis   . Pneumonia, organism unspecified(486)   . Pneumonia, organism unspecified(486) 2013   History of pneumonia  . Polyneuropathy in diabetes(357.2)    History of   . Symbolic dysfunction, unspecified   . Thyrotoxicosis without mention of goiter or other cause, without mention of thyrotoxic crisis or storm   . TIA (transient ischemic attack) 01/2018    Patient Active Problem List   Diagnosis Date Noted  . TIA (transient ischemic attack) 01/30/2018  . Hx of Guillain-Barre syndrome 09/06/2012  . Memory loss 09/06/2012  . Quadriparesis ([place]) 03/07/2012    . Alcohol abuse 03/06/2012  . Subclinical hyperthyroidism 03/03/2012  . Hypokalemia 03/03/2012  . Transaminitis 03/03/2012  . Encephalopathy acute 03/01/2012  . Weakness 03/01/2012  . Hypertension 03/01/2012  . Hyperlipidemia 03/01/2012    Past Surgical History:  Procedure Laterality Date  . ABDOMINAL HYSTERECTOMY    . ABDOMINAL HYSTERECTOMY    . CHOLECYSTECTOMY  02/26/2012   Procedure: LAPAROSCOPIC CHOLECYSTECTOMY WITH INTRAOPERATIVE CHOLANGIOGRAM;  Surgeon: Madilyn Hook, DO;  Location: [facility];  Service: General;  Laterality: N/A;  . Orthoscopic knee surgery    . TUMOR REMOVAL Left 08/07/12   behind left eye     OB History   None      Home Medications    Prior to Admission medications   Medication Sig Start Date End Date Taking? Authorizing Provider  Calcium Carbonate-Vitamin D (CALCIUM + D PO) Take 600 mg by mouth 2 (two) times daily.   Yes [provider]  Cholecalciferol (VITAMIN D PO) Take 1 tablet by mouth daily.   Yes [provider]  fish oil-omega-3 fatty acids 1000 MG capsule Take 1 capsule (1 g total) by mouth daily. 03/14/12  Yes Theodis Blaze, MD  metoprolol succinate (TOPROL-XL) 100 MG 24 hr tablet Take 1 tablet (100 mg total) by mouth daily. Take with or immediately following a meal. 03/14/12  Yes Theodis Blaze, MD  vitamin B-12 (CYANOCOBALAMIN) 1000 MCG tablet Take 1,000 mcg  by mouth daily.   Yes [provider]  VITAMIN E PO Take 1 capsule by mouth daily.   Yes [provider]  amLODipine (NORVASC) 5 MG tablet Take 1 tablet (5 mg total) by mouth daily. 01/31/18   Ghimire, Henreitta Leber, MD  aspirin EC 81 MG EC tablet Take 1 tablet (81 mg total) by mouth daily. Aspirin and Plavix for 3 weeks, followed by Plavix alone. 02/01/18   Ghimire, Henreitta Leber, MD  atorvastatin (LIPITOR) 40 MG tablet Take 1 tablet (40 mg total) by mouth daily at 6 PM. 01/31/18   Ghimire, Henreitta Leber, MD  clopidogrel (PLAVIX) 75 MG tablet Take 1 tablet (75 mg  total) by mouth daily. Aspirin and Plavix for 3 weeks, followed by Plavix alone. 02/01/18   Ghimire, Henreitta Leber, MD    Family History Family History  Problem Relation Age of Onset  . Ovarian cancer Mother   . Prostate cancer Father   . Hypertension Unknown     Social History Social History   Tobacco Use  . Smoking status: Never Smoker  . Smokeless tobacco: Never Used  Substance Use Topics  . Alcohol use: Yes    Comment: pt's daughter reports pt drinks beer and scotch every day keeping a thermos filled all day; unsure of how much    . Drug use: No     Allergies   Phenergan [promethazine hcl]; Asa [aspirin]; and Zofran [ondansetron hcl]   Review of Systems Review of Systems  Neurological: Positive for weakness.  All other systems reviewed and are negative.    Physical Exam Updated Vital Signs BP 135/72   Pulse 62   Temp 97.9 F (36.6 C) (Oral)   Resp 18   Ht 5\' 1"  (1.549 m)   Wt 81.2 kg   SpO2 100%   BMI 33.82 kg/m   Physical Exam  Constitutional: She is oriented to person, place, and time. She appears well-developed and well-nourished. No distress.  HENT:  Head: Normocephalic and atraumatic.  Right Ear: Hearing normal.  Left Ear: Hearing normal.  Nose: Nose normal.  Mouth/Throat: Oropharynx is clear and moist and mucous membranes are normal.  Eyes: Pupils are equal, round, and reactive to light. Conjunctivae and EOM are normal.  Neck: Normal range of motion. Neck supple.  Cardiovascular: Regular rhythm, S1 normal and S2 normal. Exam reveals no gallop and no friction rub.  No murmur heard. Pulmonary/Chest: Effort normal and breath sounds normal. No respiratory distress. She exhibits no tenderness.  Abdominal: Soft. Normal appearance and bowel sounds are normal. There is no hepatosplenomegaly. There is no tenderness. There is no rebound, no guarding, no tenderness at McBurney's point and negative Murphy's sign. No hernia.  Musculoskeletal: Normal range of  motion.  Neurological: She is alert and oriented to person, place, and time. She has normal strength. No cranial nerve deficit or sensory deficit. Coordination normal. GCS eye subscore is 4. GCS verbal subscore is 5. GCS motor subscore is 6.  Extraocular muscle movement: normal No visual field cut Pupils: equal and reactive both direct and consensual response is normal No nystagmus present    Sensory function is intact to light touch, pinprick Proprioception intact  Grip strength 5/5 symmetric in upper extremities No pronator drift Normal finger to nose bilaterally  Lower extremity strength 5/5 against gravity Normal heel to shin bilaterally     Skin: Skin is warm, dry and intact. No rash noted. No cyanosis.  Psychiatric: She has a normal mood and affect. Her speech  is normal and behavior is normal. Thought content normal.  Nursing note and vitals reviewed.    ED Treatments / Results  Labs (all labs ordered are listed, but only abnormal results are displayed) Labs Reviewed  COMPREHENSIVE METABOLIC PANEL - Abnormal; Notable for the following components:      Result Value   Glucose, Bld 113 (*)    AST 14 (*)    All other components within normal limits  HEMOGLOBIN A1C - Abnormal; Notable for the following components:   Hgb A1c MFr Bld 5.8 (*)    All other components within normal limits  LIPID PANEL - Abnormal; Notable for the following components:   LDL Cholesterol 109 (*)    All other components within normal limits  PROTIME-INR  APTT  CBC  DIFFERENTIAL  HIV ANTIBODY (ROUTINE TESTING W REFLEX)  CBC  CREATININE, SERUM  TROPONIN I  I-STAT TROPONIN, ED    EKG EKG Interpretation  Date/Time:  Sunday January 29 2018 19:09:38 EDT Ventricular Rate:  66 PR Interval:  186 QRS Duration: 80 QT Interval:  410 QTC Calculation: 429 R Axis:   -6 Text Interpretation:  Normal sinus rhythm Possible Left atrial enlargement Possible Anterior infarct , age undetermined  Abnormal ECG Confirmed by DeLo, Douglas ([id number]) on 01/30/2018 10:19:07 PM   Radiology No results found.  Procedures Procedures (including critical care time)  Medications Ordered in ED Medications  labetalol (NORMODYNE,TRANDATE) injection 5 mg (5 mg Intravenous Given 01/29/18 2355)   stroke: mapping our early stages of recovery book ( Does not apply Given 01/30/18 1228)  LORazepam (ATIVAN) injection 0.25 mg (0.25 mg Intravenous Given 01/30/18 0345)  zolpidem (AMBIEN) tablet 5 mg (5 mg Oral Given 01/30/18 2312)     Initial Impression / Assessment and Plan / ED Course  I have reviewed the triage vital signs and the nursing notes.  Pertinent labs & imaging results that were available during my care of the patient were reviewed by me and considered in my medical decision making (see chart for details).     Patient presents to the emergency department for evaluation of right-sided weakness that has now resolved.  She is back to her normal baseline.  This is consistent with TIA.  Patient will require hospitalization for TIA work-up.  She was very hypertensive at arrival, has improved with low-dose labetalol.  Final Clinical Impressions(s) / ED Diagnoses   Final diagnoses:  TIA (transient ischemic attack)  Essential hypertension    ED Discharge Orders         Ordered    clopidogrel (PLAVIX) 75 MG tablet  Daily     01/31/18 1009    aspirin EC 81 MG EC tablet  Daily     01/31/18 1009    amLODipine (NORVASC) 5 MG tablet  Daily     01/31/18 1009    atorvastatin (LIPITOR) 40 MG tablet  Daily-1800     01/31/18 1009    Increase activity slowly     01/31/18 1009    Diet - low sodium heart healthy     09 /24/19 1009    Discharge instructions    Comments:  Follow with Primary MD  Deland Pretty, MD in 1 week  Aspirin and Plavix for 3 weeks followed by Plavix alone  Please get a complete blood count and chemistry panel checked by your Primary MD at your next visit, and again as instructed  by your Primary MD.  Get Medicines reviewed and adjusted: Please take all your medications with you  for your next visit with your Primary MD  Laboratory/radiological data: Please request your Primary MD to go over all hospital tests and procedure/radiological results at the follow up, please ask your Primary MD to get all Hospital records sent to his/her office.  In some cases, they will be blood work, cultures and biopsy results pending at the time of your discharge. Please request that your primary care M.D. follows up on these results.  Also Note the following: If you experience worsening of your admission symptoms, develop shortness of breath, life threatening emergency, suicidal or homicidal thoughts you must seek medical attention immediately by calling 911 or calling your MD immediately  if symptoms less severe.  You must read complete instructions/literature along with all the possible adverse reactions/side effects for all the Medicines you take and that have been prescribed to you. Take any new Medicines after you have completely understood and accpet all the possible adverse reactions/side effects.   Do not drive when taking Pain medications or sleeping medications (Benzodaizepines)  Do not take more than prescribed Pain, Sleep and Anxiety Medications. It is not advisable to combine anxiety,sleep and pain medications without talking with your primary care practitioner  Special Instructions: If you have smoked or chewed Tobacco  in the last 2 yrs please stop smoking, stop any regular Alcohol  and or any Recreational drug use.  Wear Seat belts while driving.  Please note: You were cared for by a hospitalist during your hospital stay. Once you are discharged, your primary care physician will handle any further medical issues. Please note that NO REFILLS for any discharge medications will be authorized once you are discharged, as it is imperative that you return to your primary care  physician (or establish a relationship with a primary care physician if you do not have one) for your post hospital discharge needs so that they can reassess your need for medications and monitor your lab values.   01/31/18 1009    amLODipine (NORVASC) 5 MG tablet  Daily,   Status:  Discontinued     01/30/18 1503    Ambulatory referral to Neurology    Comments:  Follow up with stroke clinic NP (Jessica Richburg or Cecille Rubin, if both not available, consider Zachery Dauer, or [place]) at [facility] in about 4 weeks. Thanks.   01/30/18 1547           Orpah Greek, MD 01/30/18 0017    Orpah Greek, MD 02/13/18 [phone]

## 2018-01-29 NOTE — ED Notes (Addendum)
Hope, NP at triage to assess pt- no code stroke activation at this time.

## 2018-01-30 ENCOUNTER — Observation Stay (HOSPITAL_BASED_OUTPATIENT_CLINIC_OR_DEPARTMENT_OTHER): Payer: Medicare HMO

## 2018-01-30 ENCOUNTER — Encounter (HOSPITAL_COMMUNITY): Payer: Self-pay | Admitting: Internal Medicine

## 2018-01-30 ENCOUNTER — Institutional Professional Consult (permissible substitution): Payer: Medicare HMO | Admitting: Neurology

## 2018-01-30 ENCOUNTER — Observation Stay (HOSPITAL_COMMUNITY): Payer: Medicare HMO

## 2018-01-30 DIAGNOSIS — I10 Essential (primary) hypertension: Secondary | ICD-10-CM

## 2018-01-30 DIAGNOSIS — G459 Transient cerebral ischemic attack, unspecified: Secondary | ICD-10-CM | POA: Diagnosis present

## 2018-01-30 DIAGNOSIS — E785 Hyperlipidemia, unspecified: Secondary | ICD-10-CM

## 2018-01-30 DIAGNOSIS — E1159 Type 2 diabetes mellitus with other circulatory complications: Secondary | ICD-10-CM

## 2018-01-30 DIAGNOSIS — I160 Hypertensive urgency: Secondary | ICD-10-CM | POA: Diagnosis present

## 2018-01-30 DIAGNOSIS — I361 Nonrheumatic tricuspid (valve) insufficiency: Secondary | ICD-10-CM | POA: Diagnosis not present

## 2018-01-30 DIAGNOSIS — Z8669 Personal history of other diseases of the nervous system and sense organs: Secondary | ICD-10-CM

## 2018-01-30 DIAGNOSIS — R531 Weakness: Secondary | ICD-10-CM | POA: Diagnosis not present

## 2018-01-30 LAB — LIPID PANEL
CHOL/HDL RATIO: 3.6 ratio
Cholesterol: 167 mg/dL (ref 0–200)
HDL: 47 mg/dL (ref >40)
LDL Cholesterol: 109 mg/dL — ABNORMAL HIGH (ref 0–99)
Triglycerides: 56 mg/dL (ref <150)
VLDL: 11 mg/dL (ref 0–40)

## 2018-01-30 LAB — HIV ANTIBODY (ROUTINE TESTING W REFLEX): HIV SCREEN 4TH GENERATION: NONREACTIVE

## 2018-01-30 LAB — TROPONIN I: Troponin I: <0.03 ng/mL (ref <0.03)

## 2018-01-30 LAB — CBC
HEMATOCRIT: 41 % (ref 36.0–46.0)
Hemoglobin: 13.2 g/dL (ref 12.0–15.0)
MCH: 31.3 pg (ref 26.0–34.0)
MCHC: 32.2 g/dL (ref 30.0–36.0)
MCV: 97.2 fL (ref 78.0–100.0)
PLATELETS: 285 10*3/uL (ref 150–400)
RBC: 4.22 MIL/uL (ref 3.87–5.11)
RDW: 12.2 % (ref 11.5–15.5)
WBC: 6.7 10*3/uL (ref 4.0–10.5)

## 2018-01-30 LAB — CREATININE, SERUM: CREATININE: 0.74 mg/dL (ref 0.44–1.00)

## 2018-01-30 LAB — ECHOCARDIOGRAM COMPLETE

## 2018-01-30 LAB — HEMOGLOBIN A1C
Hgb A1c MFr Bld: 5.8 % — ABNORMAL HIGH (ref 4.8–5.6)
MEAN PLASMA GLUCOSE: 119.76 mg/dL

## 2018-01-30 MED ORDER — CALCIUM CARBONATE-VITAMIN D 500-200 MG-UNIT PO TABS
1.0000 | ORAL_TABLET | Freq: Every day | ORAL | Status: DC
  Administered 2018-01-30 – 2018-01-31 (×2): 1 via ORAL
  Filled 2018-01-30 (×2): qty 1

## 2018-01-30 MED ORDER — AMLODIPINE BESYLATE 5 MG PO TABS: 5.0000 mg | ORAL_TABLET | Freq: Every day | ORAL | 0 refills | Status: DC

## 2018-01-30 MED ORDER — CLOPIDOGREL BISULFATE 75 MG PO TABS
75.0000 mg | ORAL_TABLET | Freq: Every day | ORAL | Status: DC
  Administered 2018-01-30 – 2018-01-31 (×2): 75 mg via ORAL
  Filled 2018-01-30 (×2): qty 1

## 2018-01-30 MED ORDER — ENOXAPARIN SODIUM 40 MG/0.4ML ~~LOC~~ SOLN
40.0000 mg | SUBCUTANEOUS | Status: DC
  Administered 2018-01-30: 40 mg via SUBCUTANEOUS
  Filled 2018-01-30: qty 0.4

## 2018-01-30 MED ORDER — METOPROLOL SUCCINATE ER 100 MG PO TB24
100.0000 mg | ORAL_TABLET | Freq: Every day | ORAL | Status: DC
  Administered 2018-01-30 – 2018-01-31 (×2): 100 mg via ORAL
  Filled 2018-01-30 (×3): qty 1

## 2018-01-30 MED ORDER — ASPIRIN EC 81 MG PO TBEC
81.0000 mg | DELAYED_RELEASE_TABLET | Freq: Every day | ORAL | Status: DC
  Administered 2018-01-31: 81 mg via ORAL
  Filled 2018-01-30: qty 1

## 2018-01-30 MED ORDER — ATORVASTATIN CALCIUM 20 MG PO TABS
20.0000 mg | ORAL_TABLET | Freq: Every day | ORAL | Status: DC
  Administered 2018-01-30: 20 mg via ORAL
  Filled 2018-01-30: qty 1

## 2018-01-30 MED ORDER — CALCIUM CITRATE-VITAMIN D 315-200 MG-UNIT PO TABS: 1.0000 | ORAL_TABLET | Freq: Two times a day (BID) | ORAL | Status: DC

## 2018-01-30 MED ORDER — VITAMIN B-12 1000 MCG PO TABS
1000.0000 ug | ORAL_TABLET | Freq: Every day | ORAL | Status: DC
  Administered 2018-01-30 – 2018-01-31 (×2): 1000 ug via ORAL
  Filled 2018-01-30 (×2): qty 1

## 2018-01-30 MED ORDER — LORAZEPAM 2 MG/ML IJ SOLN
INTRAMUSCULAR | Status: AC
  Administered 2018-01-30: 0.25 mg via INTRAVENOUS
  Filled 2018-01-30: qty 1

## 2018-01-30 MED ORDER — ASPIRIN EC 325 MG PO TBEC
325.0000 mg | DELAYED_RELEASE_TABLET | Freq: Every day | ORAL | Status: DC
  Administered 2018-01-30: 325 mg via ORAL
  Filled 2018-01-30: qty 1

## 2018-01-30 MED ORDER — ZOLPIDEM TARTRATE 5 MG PO TABS
5.0000 mg | ORAL_TABLET | Freq: Once | ORAL | Status: AC
  Administered 2018-01-30: 5 mg via ORAL
  Filled 2018-01-30: qty 1

## 2018-01-30 MED ORDER — AMLODIPINE BESYLATE 5 MG PO TABS
5.0000 mg | ORAL_TABLET | Freq: Every day | ORAL | Status: DC
  Administered 2018-01-30 – 2018-01-31 (×2): 5 mg via ORAL
  Filled 2018-01-30 (×2): qty 1

## 2018-01-30 MED ORDER — LORAZEPAM 2 MG/ML IJ SOLN
0.2500 mg | Freq: Once | INTRAMUSCULAR | Status: AC
  Administered 2018-01-30: 0.25 mg via INTRAVENOUS

## 2018-01-30 MED ORDER — ACETAMINOPHEN 325 MG PO TABS: 650.0000 mg | ORAL_TABLET | ORAL | Status: DC | PRN

## 2018-01-30 MED ORDER — ACETAMINOPHEN 650 MG RE SUPP: 650.0000 mg | RECTAL | Status: DC | PRN

## 2018-01-30 MED ORDER — GEMFIBROZIL 600 MG PO TABS
600.0000 mg | ORAL_TABLET | Freq: Two times a day (BID) | ORAL | Status: DC
  Administered 2018-01-30 – 2018-01-31 (×3): 600 mg via ORAL
  Filled 2018-01-30 (×4): qty 1

## 2018-01-30 MED ORDER — OMEGA-3-ACID ETHYL ESTERS 1 G PO CAPS
1.0000 g | ORAL_CAPSULE | Freq: Every day | ORAL | Status: DC
  Administered 2018-01-30 – 2018-01-31 (×2): 1 g via ORAL
  Filled 2018-01-30 (×2): qty 1

## 2018-01-30 MED ORDER — HYDRALAZINE HCL 20 MG/ML IJ SOLN: 10.0000 mg | INTRAMUSCULAR | Status: DC | PRN

## 2018-01-30 MED ORDER — ACETAMINOPHEN 160 MG/5ML PO SOLN: 650.0000 mg | ORAL | Status: DC | PRN

## 2018-01-30 MED ORDER — STROKE: EARLY STAGES OF RECOVERY BOOK
Freq: Once | Status: AC
  Administered 2018-01-30: 12:00:00
  Filled 2018-01-30: qty 1

## 2018-01-30 MED ORDER — OMEGA-3 FATTY ACIDS 1000 MG PO CAPS: 1.0000 g | ORAL_CAPSULE | Freq: Every day | ORAL | Status: DC

## 2018-01-30 NOTE — ED Notes (Signed)
Patient transported to MRI 

## 2018-01-30 NOTE — Progress Notes (Signed)
Bilateral carotid duplex completed - Preliminary results - There is no evidence of significant ICA stenosis. Vertebral artery flow is antegrade. [facility], [place] 01/30/2018, 11:50 am

## 2018-01-30 NOTE — Progress Notes (Signed)
  Echocardiogram 2D Echocardiogram has been performed.  Johny Chess 01/30/2018, 10:38 AM

## 2018-01-30 NOTE — H&P (Signed)
History and Physical    Abigail Powell DZH:[id number] DOB: 04-07-1951 DOA: 01/29/2018  PCP: Deland Pretty, MD  Patient coming from: Home.  History was obtained from ER physician and patient's nurse.  Family not at the bedside patient has history of dementia.  Chief Complaint: Right-sided weakness.  HPI: Abigail Powell is a 68 y.o. female with history of dementia, hypertension, hyperlipidemia previous history of alcohol abuse and history of Guillain-Barr syndrome was noticed to have right upper and lower extremity weakness transiently last evening around 6 PM by patient's daughter.  Patient was immediately brought to the ER.  As per report patient not complain of any difficulty swallowing or speaking or any visual symptoms.  ED Course: In the ER patient was found to be nonfocal.  CT head was unremarkable.  Patient blood pressure was found to be markedly elevated with systolic more than 419.  On-call neurology was consulted and patient admitted for possible TIA work-up.  Review of Systems: As per HPI, rest all negative.   Past Medical History:  Diagnosis Date  . Adult failure to thrive   . Alcohol abuse, unspecified   . Benign neoplasm of eye, part unspecified   . 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 Diverticulosis   . Essential hypertension, benign   . GERD (gastroesophageal reflux disease)   . H/O gastric ulcer   . Hx of Guillain-Barre syndrome 09/06/2012  . Hypertension   . Hypopotassemia   . Insomnia, unspecified   . Lack of coordination   . Memory loss 09/06/2012  . Muscle weakness (generalized)   . Other and unspecified hyperlipidemia   . Other quadriplegia and quadriparesis   . Pneumonia, organism unspecified(486)   . Pneumonia, organism unspecified(486) 2013   History of pneumonia  . Polyneuropathy in diabetes(357.2)    History of   . Symbolic dysfunction, unspecified   . Thyrotoxicosis without mention of goiter or other cause, without mention  of thyrotoxic crisis or storm     Past Surgical History:  Procedure Laterality Date  . ABDOMINAL HYSTERECTOMY    . ABDOMINAL HYSTERECTOMY    . CHOLECYSTECTOMY  02/26/2012   Procedure: LAPAROSCOPIC CHOLECYSTECTOMY WITH INTRAOPERATIVE CHOLANGIOGRAM;  Surgeon: Madilyn Hook, DO;  Location: [facility];  Service: General;  Laterality: N/A;  . Orthoscopic knee surgery    . TUMOR REMOVAL Left 08/07/12   behind left eye     reports that she has never smoked. She has never used smokeless tobacco. She reports that she drinks alcohol. She reports that she does not use drugs.  Allergies  Allergen Reactions  . Phenergan [Promethazine Hcl] Shortness Of Breath  . Asa [Aspirin] Other (See Comments)    Causes ulcer  . Zofran [Ondansetron Hcl] Other (See Comments)    Unknown reaction per pt    Family History  Problem Relation Age of Onset  . Ovarian cancer Mother   . Prostate cancer Father   . Hypertension Unknown     Prior to Admission medications   Medication Sig Start Date End Date Taking? Authorizing Provider  Calcium Carbonate-Vitamin D (CALCIUM + D PO) Take 600 mg by mouth 2 (two) times daily.   Yes [provider]  Cholecalciferol (VITAMIN D PO) Take 1 tablet by mouth daily.   Yes [provider]  fish oil-omega-3 fatty acids 1000 MG capsule Take 1 capsule (1 g total) by mouth daily. 03/14/12  Yes Theodis Blaze, MD  gemfibrozil (LOPID) 600  MG tablet Take 1 tablet (600 mg total) by mouth 2 (two) times daily before a meal. 03/14/12  Yes Theodis Blaze, MD  metoprolol succinate (TOPROL-XL) 100 MG 24 hr tablet Take 1 tablet (100 mg total) by mouth daily. Take with or immediately following a meal. 03/14/12  Yes Theodis Blaze, MD  vitamin B-12 (CYANOCOBALAMIN) 1000 MCG tablet Take 1,000 mcg by mouth daily.   Yes [provider]  VITAMIN E PO Take 1 capsule by mouth daily.   Yes [provider]  Memantine HCl ER (NAMENDA XR) 21 MG CP24 Take 21 mg by mouth  daily. Patient not taking: Reported on 01/29/2018 02/22/14   Star Age, MD    Physical Exam: Vitals:   01/30/18 0010 01/30/18 0014 01/30/18 0030 01/30/18 0051  BP: (!) 184/96 (!) 177/102 (!) 180/86 (!) 183/102  Pulse: 67 65 66 65  Resp: 16 16 17 18   Temp:      TempSrc:      SpO2: 100% 100% 98% 100%      Constitutional: Moderately built and nourished. Vitals:   01/30/18 0010 01/30/18 0014 01/30/18 0030 01/30/18 0051  BP: (!) 184/96 (!) 177/102 (!) 180/86 (!) 183/102  Pulse: 67 65 66 65  Resp: 16 16 17 18   Temp:      TempSrc:      SpO2: 100% 100% 98% 100%   Eyes: Anicteric no pallor. ENMT: No discharge from the ears eyes nose or mouth. Neck: No mass or.  No neck rigidity.  No JVD appreciated. Respiratory: No rhonchi or crepitations. Cardiovascular: S1-S2 heard no murmurs appreciated. Abdomen: Soft nontender bowel sounds present. Musculoskeletal: No edema.  No joint effusion. Skin: No rash. Neurologic: Alert awake oriented to name.  Moves all extremities 5 x 5 no facial asymmetry tongue is midline.  Pupils equal and reacting to light. Psychiatric: Oriented to name only.   Labs on Admission: I have personally reviewed following labs and imaging studies  CBC: Recent Labs  Lab 01/29/18 1906  WBC 6.7  NEUTROABS 3.8  HGB 13.3  HCT 42.8  MCV 99.5  PLT 993   Basic Metabolic Panel: Recent Labs  Lab 01/29/18 1906  NA 143  K 4.4  CL 109  CO2 25  GLUCOSE 113*  BUN 18  CREATININE 0.88  CALCIUM 9.3   GFR: CrCl cannot be calculated (Unknown ideal weight.). Liver Function Tests: Recent Labs  Lab 01/29/18 1906  AST 14*  ALT 9  ALKPHOS 70  BILITOT 0.5  PROT 6.7  ALBUMIN 4.0   No results for input(s): LIPASE, AMYLASE in the last 168 hours. No results for input(s): AMMONIA in the last 168 hours. Coagulation Profile: Recent Labs  Lab 01/29/18 1906  INR 1.03   Cardiac Enzymes: No results for input(s): CKTOTAL, CKMB, CKMBINDEX, TROPONINI in the last 168  hours. BNP (last 3 results) No results for input(s): PROBNP in the last 8760 hours. HbA1C: No results for input(s): HGBA1C in the last 72 hours. CBG: No results for input(s): GLUCAP in the last 168 hours. Lipid Profile: No results for input(s): CHOL, HDL, LDLCALC, TRIG, CHOLHDL, LDLDIRECT in the last 72 hours. Thyroid Function Tests: No results for input(s): TSH, T4TOTAL, FREET4, T3FREE, THYROIDAB in the last 72 hours. Anemia Panel: No results for input(s): VITAMINB12, FOLATE, FERRITIN, TIBC, IRON, RETICCTPCT in the last 72 hours. Urine analysis:    Component Value Date/Time   COLORURINE YELLOW 05/26/2012 1943   APPEARANCEUR CLEAR 05/26/2012 1943   LABSPEC 1.016 05/26/2012 1943  PHURINE 6.5 05/26/2012 1943   GLUCOSEU NEGATIVE 05/26/2012 [place] 05/26/2012 [place] 05/26/2012 [place] 05/26/2012 1943   PROTEINUR NEGATIVE 05/26/2012 1943   UROBILINOGEN 0.2 05/26/2012 1943   NITRITE NEGATIVE 05/26/2012 1943   LEUKOCYTESUR TRACE (A) 05/26/2012 1943   Sepsis Labs: @LABRCNTIP (procalcitonin:4,lacticidven:4) )No results found for this or any previous visit (from the past 240 hour(s)).   Radiological Exams on Admission: Ct Head Wo Contrast  Result Date: 01/29/2018 CLINICAL DATA:  Weakness, gait difficulty. EXAM: CT HEAD WITHOUT CONTRAST TECHNIQUE: Contiguous axial images were obtained from the base of the skull through the vertex without intravenous contrast. COMPARISON:  05/26/2012 FINDINGS: Brain: No acute intracranial abnormality. Specifically, no hemorrhage, hydrocephalus, mass lesion, acute infarction, or significant intracranial injury. Mild age related volume loss. Vascular: No hyperdense vessel or unexpected calcification. Skull: No acute calvarial abnormality. Prior left frontal craniotomy. Postoperative changes in the left orbit. Sinuses/Orbits: Visualized paranasal sinuses and mastoids clear. Orbital soft tissues unremarkable.  Other: None IMPRESSION: No acute intracranial abnormality. Electronically Signed   By: Rolm Baptise M.D.   On: 01/29/2018 21:01    EKG: Independently reviewed.  Normal sinus rhythm with nonspecific ST-T changes.  Assessment/Plan Principal Problem:   TIA (transient ischemic attack) Active Problems:   Hx of Guillain-Barre syndrome   Memory loss   Hypertensive urgency    1. Possible TIA -neurology has been consulted.  At this time patient has been admitted for TIA work-up.  Will get MRI brain MRA brain 2D echo carotid Doppler.  Patient has passed swallow we will keep patient on aspirin.  Check hemoglobin A1c lipid panel physical therapy consult. 2. Hypertensive urgency -patient did receive 1 dose of labetalol in the ER.  Will keep patient on PRN IV hydralazine for systolic blood pressure more than 220 allowing for permissive hypertension until stroke ruled out.  Continue home dose of metoprolol. 3. Hyperlipidemia on Lopid. 4. History of dementia.   DVT prophylaxis: Lovenox. Code Status: Full code. Family Communication: No family at the bedside. Disposition Plan: Home. Consults called: Neurology. Admission status: Observation.   Rise Patience MD [facility] Pager [phone].  If 7PM-7AM, please contact night-coverage [email or web address] Password [email or web address]  01/30/2018, 1:09 AM

## 2018-01-30 NOTE — ED Notes (Signed)
Ordered meal tray for patient

## 2018-01-30 NOTE — ED Notes (Signed)
Echo at bedside

## 2018-01-30 NOTE — Progress Notes (Signed)
STROKE TEAM PROGRESS NOTE   SUBJECTIVE (INTERVAL HISTORY) Abigail Powell daughter is at the bedside.  Overall Abigail Powell feels Abigail Powell condition is completely resolved.  MRI and MRA negative.   OBJECTIVE Temp:  [98.2 F (36.8 C)] 98.2 F (36.8 C) (09/22 2317) Pulse Rate:  [58-92] 65 (09/23 1058) Resp:  [12-20] 20 (09/23 1058) BP: (149-216)/(72-123) 166/86 (09/23 1058) SpO2:  [96 %-100 %] 99 % (09/23 1058) FiO2 (%):  [21 %] 21 % (09/23 0107)  No results for input(s): GLUCAP in the last 168 hours. Recent Labs  Lab 01/29/18 1906 01/30/18 0226  NA 143  --   K 4.4  --   CL 109  --   CO2 25  --   GLUCOSE 113*  --   BUN 18  --   CREATININE 0.88 0.74  CALCIUM 9.3  --    Recent Labs  Lab 01/29/18 1906  AST 14*  ALT 9  ALKPHOS 70  BILITOT 0.5  PROT 6.7  ALBUMIN 4.0   Recent Labs  Lab 01/29/18 1906 01/30/18 0226  WBC 6.7 6.7  NEUTROABS 3.8  --   HGB 13.3 13.2  HCT 42.8 41.0  MCV 99.5 97.2  PLT 301 285   Recent Labs  Lab 01/30/18 0548  TROPONINI <0.03   Recent Labs    01/29/18 1906  LABPROT 13.4  INR 1.03   No results for input(s): COLORURINE, LABSPEC, PHURINE, GLUCOSEU, HGBUR, BILIRUBINUR, KETONESUR, PROTEINUR, UROBILINOGEN, NITRITE, LEUKOCYTESUR in the last 72 hours.  Invalid input(s): APPERANCEUR     Component Value Date/Time   CHOL 167 01/30/2018 0220   TRIG 56 01/30/2018 0220   HDL 47 01/30/2018 0220   CHOLHDL 3.6 01/30/2018 0220   VLDL 11 01/30/2018 0220   LDLCALC 109 (H) 01/30/2018 0220   Lab Results  Component Value Date   HGBA1C 5.8 (H) 01/30/2018   No results found for: LABOPIA, COCAINSCRNUR, LABBENZ, AMPHETMU, THCU, LABBARB  No results for input(s): ETH in the last 168 hours.  I have personally reviewed the radiological images below and agree with the radiology interpretations.  Ct Head Wo Contrast  Result Date: 01/29/2018 CLINICAL DATA:  Weakness, gait difficulty. EXAM: CT HEAD WITHOUT CONTRAST TECHNIQUE: Contiguous axial images were obtained from the  base of the skull through the vertex without intravenous contrast. COMPARISON:  05/26/2012 FINDINGS: Brain: No acute intracranial abnormality. Specifically, no hemorrhage, hydrocephalus, mass lesion, acute infarction, or significant intracranial injury. Mild age related volume loss. Vascular: No hyperdense vessel or unexpected calcification. Skull: No acute calvarial abnormality. Prior left frontal craniotomy. Postoperative changes in the left orbit. Sinuses/Orbits: Visualized paranasal sinuses and mastoids clear. Orbital soft tissues unremarkable. Other: None IMPRESSION: No acute intracranial abnormality. Electronically Signed   By: Rolm Baptise M.D.   On: 01/29/2018 21:01   Mr Brain Wo Contrast  Result Date: 01/30/2018 CLINICAL DATA:  67 y/o F; episode of right-sided weakness. History of dementia. EXAM: MRI HEAD WITHOUT CONTRAST MRA HEAD WITHOUT CONTRAST TECHNIQUE: Multiplanar, multiecho pulse sequences of the brain and surrounding structures were obtained without intravenous contrast. Angiographic images of the head were obtained using MRA technique without contrast. COMPARISON:  01/29/2018, 05/26/2012, 03/01/2013 CT of the head. 03/02/2012 MRI of the orbits. FINDINGS: MRI HEAD FINDINGS Brain: Mild motion degradation of several sequences. No acute infarction, hemorrhage, hydrocephalus, extra-axial collection or mass lesion. Few nonspecific foci of T2 FLAIR hyperintensity predominantly in periventricular white matter is compatible with mild chronic microvascular ischemic changes and there is mild nonfocal volume loss of the brain. Vascular:  As below. Skull and upper cervical spine: Chronic left frontal and sphenoid bone postsurgical changes with susceptibility artifacts from a left orbital roof mesh and surgical clips. Sinuses/Orbits: Left orbital compartments mass along the posterior aspect of the left lateral wall of orbit measuring 15 x 15 mm (series 18, image 23). The mass displaces rightward the optic  nerve. Other: None. MRA HEAD FINDINGS Internal carotid arteries:  Patent. Anterior cerebral arteries:  Patent. Middle cerebral arteries: Patent. Anterior communicating artery: Not identified, likely hypoplastic or absent. Posterior communicating arteries:  Patent.  Bilateral fetal PCA. Posterior cerebral arteries:  Patent. Basilar artery:  Patent. Vertebral arteries:  Patent.  Right dominant. No evidence of high-grade stenosis, large vessel occlusion, or aneurysm unless noted above. Left upper cervical ICA hairpin turn. IMPRESSION: 1. No acute intracranial abnormality identified. 2. Mild for age chronic microvascular ischemic changes and volume loss of the brain. 3. Left orbital compartment mass along the posterior aspect of the lateral wall measuring up to 15 mm, grossly stable from prior studies given differences in technique. 4. Patent anterior and posterior intracranial circulation. No large vessel occlusion, aneurysm, or significant stenosis. Electronically Signed   By: Kristine Garbe M.D.   On: 01/30/2018 05:04   Mr Jodene Nam Head Wo Contrast  Result Date: 01/30/2018 CLINICAL DATA:  67 y/o F; episode of right-sided weakness. History of dementia. EXAM: MRI HEAD WITHOUT CONTRAST MRA HEAD WITHOUT CONTRAST TECHNIQUE: Multiplanar, multiecho pulse sequences of the brain and surrounding structures were obtained without intravenous contrast. Angiographic images of the head were obtained using MRA technique without contrast. COMPARISON:  01/29/2018, 05/26/2012, 03/01/2013 CT of the head. 03/02/2012 MRI of the orbits. FINDINGS: MRI HEAD FINDINGS Brain: Mild motion degradation of several sequences. No acute infarction, hemorrhage, hydrocephalus, extra-axial collection or mass lesion. Few nonspecific foci of T2 FLAIR hyperintensity predominantly in periventricular white matter is compatible with mild chronic microvascular ischemic changes and there is mild nonfocal volume loss of the brain. Vascular: As below.  Skull and upper cervical spine: Chronic left frontal and sphenoid bone postsurgical changes with susceptibility artifacts from a left orbital roof mesh and surgical clips. Sinuses/Orbits: Left orbital compartments mass along the posterior aspect of the left lateral wall of orbit measuring 15 x 15 mm (series 18, image 23). The mass displaces rightward the optic nerve. Other: None. MRA HEAD FINDINGS Internal carotid arteries:  Patent. Anterior cerebral arteries:  Patent. Middle cerebral arteries: Patent. Anterior communicating artery: Not identified, likely hypoplastic or absent. Posterior communicating arteries:  Patent.  Bilateral fetal PCA. Posterior cerebral arteries:  Patent. Basilar artery:  Patent. Vertebral arteries:  Patent.  Right dominant. No evidence of high-grade stenosis, large vessel occlusion, or aneurysm unless noted above. Left upper cervical ICA hairpin turn. IMPRESSION: 1. No acute intracranial abnormality identified. 2. Mild for age chronic microvascular ischemic changes and volume loss of the brain. 3. Left orbital compartment mass along the posterior aspect of the lateral wall measuring up to 15 mm, grossly stable from prior studies given differences in technique. 4. Patent anterior and posterior intracranial circulation. No large vessel occlusion, aneurysm, or significant stenosis. Electronically Signed   By: Kristine Garbe M.D.   On: 01/30/2018 05:04    Carotid Doppler There is no evidence of significant ICA stenosis. Vertebral artery flow is antegrade  TTE  - Left ventricle: The cavity size was normal. Wall thickness was   increased in a pattern of mild LVH. Systolic function was normal.   The estimated ejection fraction was in the range  of 60% to 65%.   Wall motion was normal; there were no regional wall motion   abnormalities. Features are consistent with a pseudonormal left   ventricular filling pattern, with concomitant abnormal relaxation   and increased filling  pressure (grade 2 diastolic dysfunction).   PHYSICAL EXAM  Temp:  [98.2 F (36.8 C)] 98.2 F (36.8 C) (09/22 2317) Pulse Rate:  [58-92] 65 (09/23 1058) Resp:  [12-20] 20 (09/23 1058) BP: (149-216)/(72-123) 166/86 (09/23 1058) SpO2:  [96 %-100 %] 99 % (09/23 1058) FiO2 (%):  [21 %] 21 % (09/23 0107)  General - Well nourished, well developed, in no apparent distress.  Ophthalmologic - fundi not visualized due to noncooperation.  Cardiovascular - Regular rate and rhythm.  Mental Status -  Level of arousal and orientation to time, place, and person were intact. Language including expression, naming, repetition, comprehension was assessed and found intact.  Cranial Nerves II - XII - II - Visual field intact OU. III, IV, VI - Extraocular movements intact. V - Facial sensation intact bilaterally. VII - Facial movement intact bilaterally. VIII - Hearing & vestibular intact bilaterally. X - Palate elevates symmetrically. XI - Chin turning & shoulder shrug intact bilaterally. XII - Tongue protrusion intact.  Motor Strength - The patient's strength was normal in all extremities and pronator drift was absent.  Bulk was normal and fasciculations were absent.   Motor Tone - Muscle tone was assessed at the neck and appendages and was normal.  Reflexes - The patient's reflexes were symmetrical in all extremities and Abigail Powell had no pathological reflexes.  Sensory - Light touch, temperature/pinprick were assessed and were symmetrical.    Coordination - The patient had normal movements in the hands and feet with no ataxia or dysmetria.  Tremor was absent.  Gait and Station - deferred.   ASSESSMENT/PLAN Abigail Powell is a 67 y.o. female with history of hypertension, diabetes, PVD, dementia admitted for transient right arm leg weakness. No tPA given due to symptoms resolved.    TIA:  left brain TIA likely secondary to small vessel disease source  MRI no acute infarct  MRA  unremarkable  Carotid Doppler unremarkable  2D Echo EF 60 to 60%  LDL 109  HgbA1c 5.8  Lovenox for VTE prophylaxis  No antithrombotic prior to admission, now on aspirin 325 mg daily.  Recommend aspirin 81 and Plavix 75 for 3 weeks and then Plavix alone.  Patient counseled to be compliant with Abigail Powell antithrombotic medications  Ongoing aggressive stroke risk factor management  Therapy recommendations: Pending  Disposition: Pending  Diabetes  HgbA1c 5.8 goal < 7.0  Controlled  CBG monitoring  SSI  PCP follow up  Hypertension . Stable on the high side . BP was high on admission  Long term BP goal normotensive  Hyperlipidemia  Home meds: Lopid  LDL 109, goal < 70  Now on Lipitor 20  Continue statin and Lopid at discharge  Other Stroke Risk Factors  Advanced age  Other Active Problems  PUD  B12 deficiency on supplement  Hospital day # 0  Neurology will sign off. Please call with questions. Pt will follow up with stroke clinic NP at [facility] in about 4 weeks. Thanks for the consult.   Rosalin Hawking, MD PhD Stroke Neurology 01/30/2018 3:40 PM    To contact Stroke Continuity provider, please refer to [email or web address] After hours, contact General Neurology

## 2018-01-30 NOTE — Evaluation (Signed)
Physical Therapy Evaluation Patient Details Name: Abigail Powell MRN: [id number] DOB: 04-04-1951 Today's Date: 01/30/2018   History of Present Illness  67yo female with R UE/LE weakness, CT head negative, no tPA given. Felt back to normal in the ED. PMH alcohol abuse, eye tumor, guillain barre, HTN, memory loss, mm weakness, DM   Clinical Impression  Patient received standing at bedside, pleasant and willing to participate in skilled PT session. She is able to complete dynamic standing balance activities without difficulty, able to ambulate 271ft with no device and distant S while maintaining constant conversation with PT, and able to navigate 2 stairs without railing and close S but limited by knee pain. At this point she seems to be at her general baseline level of function per her and family's report. She does not require skilled PT services during this hospital stay or following DC. PT signing off for now, thank you for the referral.     Follow Up Recommendations No PT follow up    Equipment Recommendations  None recommended by PT    Recommendations for Other Services       Precautions / Restrictions Precautions Precautions: None Restrictions Weight Bearing Restrictions: No      Mobility  Bed Mobility               General bed mobility comments: DNT, received standing at bedside in the room   Transfers Overall transfer level: Needs assistance Equipment used: None Transfers: Sit to/from Stand Sit to Stand: Modified independent (Device/Increase time)         General transfer comment: no physical assist given   Ambulation/Gait Ambulation/Gait assistance: Supervision Gait Distance (Feet): 200 Feet Assistive device: None Gait Pattern/deviations: WFL(Within Functional Limits);Step-through pattern     General Gait Details: cues to avoid obstacles in hallway, otherwise gait pattern WNL   Stairs Stairs: Yes Stairs assistance: Supervision Stair Management: No  rails Number of Stairs: 2 General stair comments: limited by knee pain, able to do with S for safety   Wheelchair Mobility    Modified Rankin (Stroke Patients Only)       Balance Overall balance assessment: Mild deficits observed, not formally tested                                           Pertinent Vitals/Pain Pain Assessment: No/denies pain    Home Living Family/patient expects to be discharged to:: Private residence Living Arrangements: Children;Other relatives Available Help at Discharge: Family;Available PRN/intermittently Type of Home: Apartment Home Access: Stairs to enter Entrance Stairs-Rails: None Entrance Stairs-Number of Steps: 2 Home Layout: One level Home Equipment: None      Prior Function Level of Independence: Independent               Hand Dominance        Extremity/Trunk Assessment   Upper Extremity Assessment Upper Extremity Assessment: Overall WFL for tasks assessed    Lower Extremity Assessment Lower Extremity Assessment: Overall WFL for tasks assessed    Cervical / Trunk Assessment Cervical / Trunk Assessment: Normal  Communication   Communication: No difficulties  Cognition Arousal/Alertness: Awake/alert Behavior During Therapy: WFL for tasks assessed/performed Overall Cognitive Status: Within Functional Limits for tasks assessed  General Comments      Exercises     Assessment/Plan    PT Assessment Patent does not need any further PT services  PT Problem List Decreased balance       PT Treatment Interventions      PT Goals (Current goals can be found in the Care Plan section)  Acute Rehab PT Goals Patient Stated Goal: to go home  PT Goal Formulation: With patient/family Time For Goal Achievement: 02/13/18 Potential to Achieve Goals: Good    Frequency Other (Comment)(eval only )   Barriers to discharge        Co-evaluation                AM-PAC PT "6 Clicks" Daily Activity  Outcome Measure Difficulty turning over in bed (including adjusting bedclothes, sheets and blankets)?: None Difficulty moving from lying on back to sitting on the side of the bed? : None Difficulty sitting down on and standing up from a chair with arms (e.g., wheelchair, bedside commode, etc,.)?: None Help needed moving to and from a bed to chair (including a wheelchair)?: None Help needed walking in hospital room?: None Help needed climbing 3-5 steps with a railing? : None 6 Click Score: 24    End of Session   Activity Tolerance: Patient tolerated treatment well Patient left: in bed;with family/visitor present   PT Visit Diagnosis: Unsteadiness on feet (R26.81)    Time: 2257-5051 PT Time Calculation (min) (ACUTE ONLY): 13 min   Charges:   PT Evaluation $PT Eval Low Complexity: 1 Low          Deniece Ree PT, DPT, CBIS  Supplemental Physical Therapist [place]    Pager [phone] Acute Rehab Office [phone]

## 2018-01-30 NOTE — Consult Note (Signed)
Neurology Consultation Reason for Consult: TIA Referring Physician: Betsey Holiday, C  CC: TIA  History is obtained from: Patient, chart  HPI: Abigail Powell is a 67 y.o. female with a history of dementia who is not much help with a history given that due to her dementia she does not remember much of it.  According to her daughter she apparently had trouble with using her right arm and leg that was transient.  She is brought into the ER, but by the time she was seen she was back to baseline.  She currently feels her normal self.   LKW: 6 PM tpa given?: no, resolution of symptoms    ROS: A 14 point ROS was performed and is negative except as noted in the HPI.   Past Medical History:  Diagnosis Date  . Adult failure to thrive   . Alcohol abuse, unspecified   . Benign neoplasm of eye, part unspecified   . 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 Diverticulosis   . Essential hypertension, benign   . GERD (gastroesophageal reflux disease)   . H/O gastric ulcer   . Hx of Guillain-Barre syndrome 09/06/2012  . Hypertension   . Hypopotassemia   . Insomnia, unspecified   . Lack of coordination   . Memory loss 09/06/2012  . Muscle weakness (generalized)   . Other and unspecified hyperlipidemia   . Other quadriplegia and quadriparesis   . Pneumonia, organism unspecified(486)   . Pneumonia, organism unspecified(486) 2013   History of pneumonia  . Polyneuropathy in diabetes(357.2)    History of   . Symbolic dysfunction, unspecified   . Thyrotoxicosis without mention of goiter or other cause, without mention of thyrotoxic crisis or storm      Family History  Problem Relation Age of Onset  . Ovarian cancer Mother   . Prostate cancer Father   . Hypertension Unknown      Social History:  reports that she has never smoked. She has never used smokeless tobacco. She reports that she drinks alcohol. She reports that she does not use drugs.   Exam: Current vital  signs: BP (!) 180/86   Pulse 66   Temp 98.2 F (36.8 C)   Resp 17   SpO2 98%  Vital signs in last 24 hours: Temp:  [98.2 F (36.8 C)] 98.2 F (36.8 C) (09/22 2317) Pulse Rate:  [58-67] 66 (09/23 0030) Resp:  [15-19] 17 (09/23 0030) BP: (153-216)/(76-123) 180/86 (09/23 0030) SpO2:  [97 %-100 %] 98 % (09/23 0030)   Physical Exam  Constitutional: Appears well-developed and well-nourished.  Psych: Affect appropriate to situation Eyes: No scleral injection HENT: No OP obstrucion Head: Normocephalic.  Cardiovascular: Normal rate and regular rhythm.  Respiratory: Effort normal, non-labored breathing GI: Soft.  No distension. There is no tenderness.  Skin: WDI  Neuro: Mental Status: Patient is awake, alert, oriented to person, place, she thinks that it is winter. Patient is able to give a clear and coherent history. No signs of aphasia or neglect Cranial Nerves: II: Visual Fields are full. Pupils are equal, round, and reactive to light.   III,IV, VI: EOMI without ptosis or diploplia.  V: Facial sensation is symmetric to temperature VII: Facial movement is symmetric.  VIII: hearing is intact to voice X: Uvula elevates symmetrically XI: Shoulder shrug is symmetric. XII: tongue is midline without atrophy or fasciculations.  Motor: Tone is normal. Bulk is normal. 5/5 strength was present in all  four extremities.  Sensory: Sensation is symmetric to light touch and temperature in the arms and legs. Cerebellar: FNF and HKS are intact bilaterally    I have reviewed labs in epic and the results pertinent to this consultation are: BMP-unremarkable  I have reviewed the images obtained: CT head-unremarkable  Impression: 67 year old female with transient right arm and leg weakness that is most consistent with TIA.  She is being admitted for such.  Recommendations: - HgbA1c, fasting lipid panel - MRI, MRA  of the brain without contrast - Frequent neuro checks -  Echocardiogram - Carotid dopplers - Prophylactic therapy-Antiplatelet med: Aspirin - dose 325mg  PO or 300mg  PR - Risk factor modification - Telemetry monitoring - PT consult, OT consult, Speech consult - Stroke team to follow    Roland Rack, MD [facility] [phone]  If 7pm- 7am, please page neurology on call as listed in [place].

## 2018-01-30 NOTE — Discharge Instructions (Signed)
Transient Ischemic Attack °A transient ischemic attack (TIA) is a "warning stroke" that causes stroke-like symptoms. A TIA does not cause lasting damage to the brain. The symptoms of a TIA can happen fast and do not last long. It is important to know the symptoms of a TIA and what to do. This can help prevent stroke or death. °Follow these instructions at home: °· Take medicines only as told by your doctor. Make sure you understand all of the instructions. °· You may need to take aspirin or warfarin medicine. Warfarin needs to be taken exactly as told. °? Taking too much or too little warfarin is dangerous. Blood tests must be done as often as told by your doctor. A PT blood test measures how long it takes for blood to clot. Your PT is used to calculate another value called an INR. Your PT and INR help your doctor adjust your warfarin dosage. He or she will make sure you are taking the right amount. °? Food can cause problems with warfarin and affect the results of your blood tests. This is true for foods high in vitamin K. Eat the same amount of foods high in vitamin K each day. Foods high in vitamin K include spinach, kale, broccoli, cabbage, collard and turnip greens, Brussels sprouts, peas, cauliflower, seaweed, and parsley. Other foods high in vitamin K include beef and pork liver, green tea, and soybean oil. Eat the same amount of foods high in vitamin K each day. Avoid big changes in your diet. Tell your doctor before changing your diet. Talk to a food specialist (dietitian) if you have questions. °? Many medicines can cause problems with warfarin and affect your PT and INR. Tell your doctor about all medicines you take. This includes vitamins and dietary pills (supplements). Do not take or stop taking any prescribed or over-the-counter medicines unless your doctor tells you to. °? Warfarin can cause more bruising or bleeding. Hold pressure over any cuts for longer than normal. Talk to your doctor about other  side effects of warfarin. °? Avoid sports or activities that may cause injury or bleeding. °? Be careful when you shave, floss, or use sharp objects. °? Avoid or drink very little alcohol while taking warfarin. Tell your doctor if you change how much alcohol you drink. °? Tell your dentist and other doctors that you take warfarin before any procedures. °· Follow your diet program as told, if you are given one. °· Keep a healthy weight. °· Stay active. Try to get at least 30 minutes of activity on all or most days. °· Do not use any tobacco products, including cigarettes, chewing tobacco, or electronic cigarettes. If you need help quitting, ask your doctor. °· Limit alcohol intake to no more than 1 drink per day for nonpregnant women and 2 drinks per day for men. One drink equals 12 ounces of beer, 5 ounces of wine, or 1½ ounces of hard liquor. °· Do not abuse drugs. °· Keep your home safe so you do not fall. You can do this by: °? Putting grab bars in the bedroom and bathroom. °? Raising toilet seats. °? Putting a seat in the shower. °· Keep all follow-up visits as told by your doctor. This is important. °Contact a doctor if: °· Your personality changes. °· You have trouble swallowing. °· You have double vision. °· You are dizzy. °· You have a fever. °Get help right away if: °These symptoms may be an emergency. Do not wait to see if   the symptoms will go away. Get medical help right away. Call your local emergency services (911 in the [place] Do not drive yourself to the hospital. °· You have sudden weakness or lose feeling (go numb), especially on one side of the body. This can affect your: °? Face. °? Arm. °? Leg. °· You have sudden trouble walking. °· You have sudden trouble moving your arms or legs. °· You have sudden confusion. °· You have trouble talking. °· You have trouble understanding. °· You have sudden trouble seeing in one or both eyes. °· You lose your balance. °· Your movements are not smooth. °· You  have a sudden, very bad headache with no known cause. °· You have new chest pain. °· Your heartbeat is unsteady. °· You are partly or totally unaware of what is going on around you. ° °This information is not intended to replace advice given to you by your health care provider. Make sure you discuss any questions you have with your health care provider. °Document Released: 02/03/2008 Document Revised: 12/29/2015 Document Reviewed: 08/01/2013 °Elsevier Interactive Patient Education © 2018 [employer] ° °

## 2018-01-30 NOTE — Discharge Summary (Signed)
DISCHARGE SUMMARY  Abigail Powell  MR#: [id number]  DOB:09/23/1950  Date of Admission: 01/29/2018 Date of Discharge: 01/30/2018  Attending Physician:Kanda Deluna Hennie Duos, MD  Patient's RKY:HCWCB, Abigail Jew, MD  Consults: Stroke Team   Disposition: D/C home   Follow-up Appts: Follow-up Information    Deland Pretty, MD Follow up in 1 week(s).   Specialty:  Internal Medicine Contact information: [address] [place] [address] [phone]           Tests Needing Follow-up: -assess BP control   Discharge Diagnoses: L brain TIA Malignant HTN Early onset dementia  Remote hx of EtOH abuse   Initial presentation: 67 y.o.femalewitha history of dementia, hypertension, hyperlipidemia, prior alcohol abuse, and Guillain-Barr who was noted to have right upper and lower extremity weakness transiently by her daughter. Patient was immediately brought to the ER.   In the ER the patient was found to be nonfocal. CT head was unremarkable. Patient's blood pressure was found to be markedly elevated with systolic > 710. Neurology was consulted and patient admitted for possible TIA work-up.  Hospital Course: The patient was admitted to the acute units for TIA evaluation.  Bilateral carotid duplex was completed and revealed no evidence of significant ICA stenosis with vertebral artery flow antegrade.  MRI/MRA of the brain was carried out and revealed no acute intracranial abnormality.  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  There was no large vessel occlusion aneurysm or significant stenosis appreciated on the MRA.  A TTE revealed an EF of 60% with grade 2 diastolic dysfunction with no regional wall motion abnormalities and no obvious source of potential emboli.  Her A1c was found to be 5.8 and a lipid panel revealed an LDL of 109.  It was felt that the patient's TIA was likely a hypertensive  phenomenon.  She was cleared for discharge by the neurology team.  She is instructed to follow-up with her primary care physician in 7 to 10 days for assessment of her blood pressure. Norvasc has been added to her tx regimen.    Allergies as of 01/30/2018      Reactions   Phenergan [promethazine Hcl] Shortness Of Breath   Asa [aspirin] Other (See Comments)   Causes ulcer   Zofran [ondansetron Hcl] Other (See Comments)   Unknown reaction per pt      Medication List    STOP taking these medications   Memantine HCl ER 21 MG Cp24     TAKE these medications   amLODipine 5 MG tablet Commonly known as:  NORVASC Take 1 tablet (5 mg total) by mouth daily.   CALCIUM + D PO Take 600 mg by mouth 2 (two) times daily.   fish oil-omega-3 fatty acids 1000 MG capsule Take 1 capsule (1 g total) by mouth daily.   gemfibrozil 600 MG tablet Commonly known as:  LOPID Take 1 tablet (600 mg total) by mouth 2 (two) times daily before a meal.   metoprolol succinate 100 MG 24 hr tablet Commonly known as:  TOPROL-XL Take 1 tablet (100 mg total) by mouth daily. Take with or immediately following a meal.   vitamin B-12 1000 MCG tablet Commonly known as:  CYANOCOBALAMIN Take 1,000 mcg by mouth daily.   VITAMIN D PO Take 1 tablet by mouth daily.   VITAMIN E PO Take 1 capsule by mouth daily.       Day of Discharge  BP (!) 166/86   Pulse 65   Temp 98.2 F (36.8 C)   Resp 20   SpO2 99%   Physical Exam: General: No acute respiratory distress Lungs: Clear to auscultation bilaterally without wheezes or crackles Cardiovascular: Regular rate and rhythm without murmur gallop or rub normal S1 and S2 Abdomen: Nontender, nondistended, soft, bowel sounds positive, no rebound, no ascites, no appreciable mass Extremities: No significant cyanosis, clubbing, or edema bilateral lower extremities  Basic Metabolic Panel: Recent Labs  Lab 01/29/18 1906 01/30/18 0226  NA 143  --   K 4.4  --   CL 109   --   CO2 25  --   GLUCOSE 113*  --   BUN 18  --   CREATININE 0.88 0.74  CALCIUM 9.3  --     Liver Function Tests: Recent Labs  Lab 01/29/18 1906  AST 14*  ALT 9  ALKPHOS 70  BILITOT 0.5  PROT 6.7  ALBUMIN 4.0    Coags: Recent Labs  Lab 01/29/18 1906  INR 1.03   CBC: Recent Labs  Lab 01/29/18 1906 01/30/18 0226  WBC 6.7 6.7  NEUTROABS 3.8  --   HGB 13.3 13.2  HCT 42.8 41.0  MCV 99.5 97.2  PLT 301 285    Cardiac Enzymes: Recent Labs  Lab 01/30/18 0548  TROPONINI <0.03   Time spent in discharge (includes decision making & examination of pt): 30 minutes  01/30/2018, 3:06 PM   Cherene Altes, MD [facility] Office  [phone] Pager [phone]  On-Call/Text Page:      [email or web address]      password [facility]

## 2018-01-30 NOTE — ED Notes (Signed)
Pt ambulated to restroom independently.

## 2018-01-30 NOTE — Progress Notes (Signed)
PT Cancellation Note  Patient Details Name: Abigail Powell MRN: [id number] DOB: 11-21-1950   Cancelled Treatment:    Reason Eval/Treat Not Completed: Patient at procedure or test/unavailable attempted to see patient however she was not in her room/was unavailable. Plan to attempt to return later if time/schedule allow.    Deniece Ree PT, DPT, CBIS  Supplemental Physical Therapist [facility]    Pager [phone] Acute Rehab Office [phone]

## 2018-01-31 ENCOUNTER — Other Ambulatory Visit: Payer: Self-pay

## 2018-01-31 ENCOUNTER — Encounter (HOSPITAL_COMMUNITY): Payer: Self-pay | Admitting: General Practice

## 2018-01-31 DIAGNOSIS — Z8669 Personal history of other diseases of the nervous system and sense organs: Secondary | ICD-10-CM | POA: Diagnosis not present

## 2018-01-31 DIAGNOSIS — G459 Transient cerebral ischemic attack, unspecified: Secondary | ICD-10-CM | POA: Diagnosis not present

## 2018-01-31 DIAGNOSIS — I10 Essential (primary) hypertension: Secondary | ICD-10-CM | POA: Diagnosis not present

## 2018-01-31 DIAGNOSIS — R413 Other amnesia: Secondary | ICD-10-CM | POA: Diagnosis not present

## 2018-01-31 MED ORDER — AMLODIPINE BESYLATE 5 MG PO TABS: 5.0000 mg | ORAL_TABLET | Freq: Every day | ORAL | 0 refills | Status: DC

## 2018-01-31 MED ORDER — ASPIRIN 81 MG PO TBEC: 81.0000 mg | DELAYED_RELEASE_TABLET | Freq: Every day | ORAL | 0 refills | Status: DC

## 2018-01-31 MED ORDER — ATORVASTATIN CALCIUM 40 MG PO TABS: 40.0000 mg | ORAL_TABLET | Freq: Every day | ORAL | 0 refills | Status: DC

## 2018-01-31 MED ORDER — CLOPIDOGREL BISULFATE 75 MG PO TABS: 75.0000 mg | ORAL_TABLET | Freq: Every day | ORAL | 0 refills | Status: DC

## 2018-01-31 NOTE — Care Management Obs Status (Signed)
[place] NOTIFICATION   Patient Details  Name: Abigail Powell MRN: [id number] Date of Birth: [birth year]   Medicare Observation Status Notification Given:  Yes    Sharin Mons, RN 01/31/2018, 11:54 AM

## 2018-01-31 NOTE — Discharge Summary (Signed)
PATIENT DETAILS Name: Abigail Powell Age: 67 y.o. Sex: female Date of Birth: 02-28-1951 MRN: [id number]. Admitting Physician: Cherene Altes, MD [id number], [name], MD  Admit Date: 01/29/2018 Discharge date: 01/31/2018  Recommendations for Outpatient Follow-up:  1. Follow up with PCP in 1-2 weeks 2. Please obtain BMP/CBC in one week 3. Repeat lipid panel in 3 months 4. Ensure outpatient follow-up with neurology 5. Aspirin/Plavix for 3 weeks followed by Plavix alone  Admitted From:  Home  Disposition: [place]: No  Equipment/Devices: None  Discharge Condition: Stable  CODE STATUS: FULL CODE  Diet recommendation:  Heart Healthy   Brief Summary: See H&P, Labs, Consult and Test reports for all details in brief, patient is a 67 year old female with history of dementia, prior history of Guillain-Barr syndrome, hypertension presented with with transient right arm and leg weakness.  MRI of the brain did not show acute CVA.  She was felt to have a TIA.  See below for further details  Brief Hospital Course: TIA: Presenting symptoms of right arm/leg weakness have completely resolved.  She has a nonfocal exam this morning.  MRI of the brain did not show any acute infarct.  Carotid Doppler did not show any significant stenosis.  Echocardiogram showed preserved EF.  Telemetry monitoring did not show any A. fib.  LDL was 109, A1c 5.8.  Recommendations from neurology are to continue with aspirin and Plavix for 3 weeks, followed by Plavix alone.  I will stop her Lopid-and just switch her to a high intensity statin on discharge-this is being done to decrease the risk of myalgias/myopathy.  Hypertension: Continue amlodipine.  Dyslipidemia: Continue high intensity statin-we will discontinue Lopid on discharge.  Dementia: Patient at baseline-per son-no longer on Namenda as this was recently discontinued by her primary care practitioner.  Known left orbital compartment mass:  MRI reading this appears to be stable from prior studies.  Further work-up deferred to the outpatient setting.  Procedures: None  Discharge Diagnoses:  Principal Problem:   TIA (transient ischemic attack) Active Problems:   Hx of Guillain-Barre syndrome   Memory loss   Discharge Instructions:  Activity:  As tolerated with Full fall precautions use walker/cane & assistance as needed  Discharge Instructions    Ambulatory referral to Neurology   Complete by:  As directed    Follow up with stroke clinic NP (Jessica Vanschaick or Cecille Rubin, if both not available, consider Zachery Dauer, or [place]) at [facility] in about 4 weeks. Thanks.   Diet - low sodium heart healthy   Complete by:  As directed    Discharge instructions   Complete by:  As directed    Follow with Primary MD  Deland Pretty, MD in 1 week  Aspirin and Plavix for 3 weeks followed by Plavix alone  Please get a complete blood count and chemistry panel checked by your Primary MD at your next visit, and again as instructed by your Primary MD.  Get Medicines reviewed and adjusted: Please take all your medications with you for your next visit with your Primary MD  Laboratory/radiological data: Please request your Primary MD to go over all hospital tests and procedure/radiological results at the follow up, please ask your Primary MD to get all Hospital records sent to his/her office.  In some cases, they will be blood work, cultures and biopsy results pending at the time of your discharge. Please request that your primary care M.D. follows up on these results.  Also Note the  following: If you experience worsening of your admission symptoms, develop shortness of breath, life threatening emergency, suicidal or homicidal thoughts you must seek medical attention immediately by calling 911 or calling your MD immediately  if symptoms less severe.  You must read complete instructions/literature along with all the possible  adverse reactions/side effects for all the Medicines you take and that have been prescribed to you. Take any new Medicines after you have completely understood and accpet all the possible adverse reactions/side effects.   Do not drive when taking Pain medications or sleeping medications (Benzodaizepines)  Do not take more than prescribed Pain, Sleep and Anxiety Medications. It is not advisable to combine anxiety,sleep and pain medications without talking with your primary care practitioner  Special Instructions: If you have smoked or chewed Tobacco  in the last 2 yrs please stop smoking, stop any regular Alcohol  and or any Recreational drug use.  Wear Seat belts while driving.  Please note: You were cared for by a hospitalist during your hospital stay. Once you are discharged, your primary care physician will handle any further medical issues. Please note that NO REFILLS for any discharge medications will be authorized once you are discharged, as it is imperative that you return to your primary care physician (or establish a relationship with a primary care physician if you do not have one) for your post hospital discharge needs so that they can reassess your need for medications and monitor your lab values.   Increase activity slowly   Complete by:  As directed      Allergies as of 01/31/2018      Reactions   Phenergan [promethazine Hcl] Shortness Of Breath   Asa [aspirin] Other (See Comments)   Causes ulcer   Zofran [ondansetron Hcl] Other (See Comments)   Unknown reaction per pt      Medication List    STOP taking these medications   gemfibrozil 600 MG tablet Commonly known as:  LOPID   Memantine HCl ER 21 MG Cp24     TAKE these medications   amLODipine 5 MG tablet Commonly known as:  NORVASC Take 1 tablet (5 mg total) by mouth daily.   aspirin 81 MG EC tablet Take 1 tablet (81 mg total) by mouth daily. Aspirin and Plavix for 3 weeks, followed by Plavix alone. Start taking  on:  02/01/2018   atorvastatin 40 MG tablet Commonly known as:  LIPITOR Take 1 tablet (40 mg total) by mouth daily at 6 PM.   CALCIUM + D PO Take 600 mg by mouth 2 (two) times daily.   clopidogrel 75 MG tablet Commonly known as:  PLAVIX Take 1 tablet (75 mg total) by mouth daily. Aspirin and Plavix for 3 weeks, followed by Plavix alone. Start taking on:  02/01/2018   fish oil-omega-3 fatty acids 1000 MG capsule Take 1 capsule (1 g total) by mouth daily.   metoprolol succinate 100 MG 24 hr tablet Commonly known as:  TOPROL-XL Take 1 tablet (100 mg total) by mouth daily. Take with or immediately following a meal.   vitamin B-12 1000 MCG tablet Commonly known as:  CYANOCOBALAMIN Take 1,000 mcg by mouth daily.   VITAMIN D PO Take 1 tablet by mouth daily.   VITAMIN E PO Take 1 capsule by mouth daily.      Follow-up Information    Deland Pretty, MD Follow up in 1 week(s).   Specialty:  Internal Medicine Contact information: [address] [place] [address]  [facility]. Schedule an appointment as soon as possible for a visit in 4 week(s).   Specialty:  Radiology Contact information: [place] [phone]         Allergies  Allergen Reactions  . Phenergan [Promethazine Hcl] Shortness Of Breath  . Asa [Aspirin] Other (See Comments)    Causes ulcer  . Zofran [Ondansetron Hcl] Other (See Comments)    Unknown reaction per pt    Consultations:   neurology  Other Procedures/Studies: Ct Head Wo Contrast  Result Date: 01/29/2018 CLINICAL DATA:  Weakness, gait difficulty. EXAM: CT HEAD WITHOUT CONTRAST TECHNIQUE: Contiguous axial images were obtained from the base of the skull through the vertex without intravenous contrast. COMPARISON:  05/26/2012 FINDINGS: Brain: No acute intracranial abnormality. Specifically, no hemorrhage, hydrocephalus, mass  lesion, acute infarction, or significant intracranial injury. Mild age related volume loss. Vascular: No hyperdense vessel or unexpected calcification. Skull: No acute calvarial abnormality. Prior left frontal craniotomy. Postoperative changes in the left orbit. Sinuses/Orbits: Visualized paranasal sinuses and mastoids clear. Orbital soft tissues unremarkable. Other: None IMPRESSION: No acute intracranial abnormality. Electronically Signed   By: Rolm Baptise M.D.   On: 01/29/2018 21:01   Mr Brain Wo Contrast  Result Date: 01/30/2018 CLINICAL DATA:  67 y/o F; episode of right-sided weakness. History of dementia. EXAM: MRI HEAD WITHOUT CONTRAST MRA HEAD WITHOUT CONTRAST TECHNIQUE: Multiplanar, multiecho pulse sequences of the brain and surrounding structures were obtained without intravenous contrast. Angiographic images of the head were obtained using MRA technique without contrast. COMPARISON:  01/29/2018, 05/26/2012, 03/01/2013 CT of the head. 03/02/2012 MRI of the orbits. FINDINGS: MRI HEAD FINDINGS Brain: Mild motion degradation of several sequences. No acute infarction, hemorrhage, hydrocephalus, extra-axial collection or mass lesion. Few nonspecific foci of T2 FLAIR hyperintensity predominantly in periventricular white matter is compatible with mild chronic microvascular ischemic changes and there is mild nonfocal volume loss of the brain. Vascular: As below. Skull and upper cervical spine: Chronic left frontal and sphenoid bone postsurgical changes with susceptibility artifacts from a left orbital roof mesh and surgical clips. Sinuses/Orbits: Left orbital compartments mass along the posterior aspect of the left lateral wall of orbit measuring 15 x 15 mm (series 18, image 23). The mass displaces rightward the optic nerve. Other: None. MRA HEAD FINDINGS Internal carotid arteries:  Patent. Anterior cerebral arteries:  Patent. Middle cerebral arteries: Patent. Anterior communicating artery: Not identified,  likely hypoplastic or absent. Posterior communicating arteries:  Patent.  Bilateral fetal PCA. Posterior cerebral arteries:  Patent. Basilar artery:  Patent. Vertebral arteries:  Patent.  Right dominant. No evidence of high-grade stenosis, large vessel occlusion, or aneurysm unless noted above. Left upper cervical ICA hairpin turn. IMPRESSION: 1. No acute intracranial abnormality identified. 2. Mild for age chronic microvascular ischemic changes and volume loss of the brain. 3. Left orbital compartment mass along the posterior aspect of the lateral wall measuring up to 15 mm, grossly stable from prior studies given differences in technique. 4. Patent anterior and posterior intracranial circulation. No large vessel occlusion, aneurysm, or significant stenosis. Electronically Signed   By: Kristine Garbe M.D.   On: 01/30/2018 05:04   Mr Jodene Nam Head Wo Contrast  Result Date: 01/30/2018 CLINICAL DATA:  67 y/o F; episode of right-sided weakness. History of dementia. EXAM: MRI HEAD WITHOUT CONTRAST MRA HEAD WITHOUT CONTRAST TECHNIQUE: Multiplanar, multiecho pulse sequences of the brain and surrounding structures were obtained without  intravenous contrast. Angiographic images of the head were obtained using MRA technique without contrast. COMPARISON:  01/29/2018, 05/26/2012, 03/01/2013 CT of the head. 03/02/2012 MRI of the orbits. FINDINGS: MRI HEAD FINDINGS Brain: Mild motion degradation of several sequences. No acute infarction, hemorrhage, hydrocephalus, extra-axial collection or mass lesion. Few nonspecific foci of T2 FLAIR hyperintensity predominantly in periventricular white matter is compatible with mild chronic microvascular ischemic changes and there is mild nonfocal volume loss of the brain. Vascular: As below. Skull and upper cervical spine: Chronic left frontal and sphenoid bone postsurgical changes with susceptibility artifacts from a left orbital roof mesh and surgical clips. Sinuses/Orbits: Left  orbital compartments mass along the posterior aspect of the left lateral wall of orbit measuring 15 x 15 mm (series 18, image 23). The mass displaces rightward the optic nerve. Other: None. MRA HEAD FINDINGS Internal carotid arteries:  Patent. Anterior cerebral arteries:  Patent. Middle cerebral arteries: Patent. Anterior communicating artery: Not identified, likely hypoplastic or absent. Posterior communicating arteries:  Patent.  Bilateral fetal PCA. Posterior cerebral arteries:  Patent. Basilar artery:  Patent. Vertebral arteries:  Patent.  Right dominant. No evidence of high-grade stenosis, large vessel occlusion, or aneurysm unless noted above. Left upper cervical ICA hairpin turn. IMPRESSION: 1. No acute intracranial abnormality identified. 2. Mild for age chronic microvascular ischemic changes and volume loss of the brain. 3. Left orbital compartment mass along the posterior aspect of the lateral wall measuring up to 15 mm, grossly stable from prior studies given differences in technique. 4. Patent anterior and posterior intracranial circulation. No large vessel occlusion, aneurysm, or significant stenosis. Electronically Signed   By: Kristine Garbe M.D.   On: 01/30/2018 05:04      TODAY-DAY OF DISCHARGE:  Subjective:   Noralee Chars today has no headache,no chest abdominal pain,no new weakness tingling or numbness, feels much better wants to go home today.   Objective:   Blood pressure (!) 143/78, pulse (!) 56, temperature 97.8 F (36.6 C), temperature source Oral, resp. rate 16, SpO2 100 %. No intake or output data in the 24 hours ending 01/31/18 1009 There were no vitals filed for this visit.  Exam: Awake Alert, Oriented *3, No new F.N deficits, Normal affect [place].AT,PERRAL Supple Neck,No JVD, No cervical lymphadenopathy appriciated.  Symmetrical Chest wall movement, Good air movement bilaterally, CTAB RRR,No Gallops,Rubs or new Murmurs, No Parasternal Heave +ve B.Sounds, Abd  Soft, Non tender, No organomegaly appriciated, No rebound -guarding or rigidity. No Cyanosis, Clubbing or edema, No new Rash or bruise   PERTINENT RADIOLOGIC STUDIES: Ct Head Wo Contrast  Result Date: 01/29/2018 CLINICAL DATA:  Weakness, gait difficulty. EXAM: CT HEAD WITHOUT CONTRAST TECHNIQUE: Contiguous axial images were obtained from the base of the skull through the vertex without intravenous contrast. COMPARISON:  05/26/2012 FINDINGS: Brain: No acute intracranial abnormality. Specifically, no hemorrhage, hydrocephalus, mass lesion, acute infarction, or significant intracranial injury. Mild age related volume loss. Vascular: No hyperdense vessel or unexpected calcification. Skull: No acute calvarial abnormality. Prior left frontal craniotomy. Postoperative changes in the left orbit. Sinuses/Orbits: Visualized paranasal sinuses and mastoids clear. Orbital soft tissues unremarkable. Other: None IMPRESSION: No acute intracranial abnormality. Electronically Signed   By: Rolm Baptise M.D.   On: 01/29/2018 21:01   Mr Brain Wo Contrast  Result Date: 01/30/2018 CLINICAL DATA:  67 y/o F; episode of right-sided weakness. History of dementia. EXAM: MRI HEAD WITHOUT CONTRAST MRA HEAD WITHOUT CONTRAST TECHNIQUE: Multiplanar, multiecho pulse sequences of the brain and surrounding structures were obtained without  intravenous contrast. Angiographic images of the head were obtained using MRA technique without contrast. COMPARISON:  01/29/2018, 05/26/2012, 03/01/2013 CT of the head. 03/02/2012 MRI of the orbits. FINDINGS: MRI HEAD FINDINGS Brain: Mild motion degradation of several sequences. No acute infarction, hemorrhage, hydrocephalus, extra-axial collection or mass lesion. Few nonspecific foci of T2 FLAIR hyperintensity predominantly in periventricular white matter is compatible with mild chronic microvascular ischemic changes and there is mild nonfocal volume loss of the brain. Vascular: As below. Skull and upper  cervical spine: Chronic left frontal and sphenoid bone postsurgical changes with susceptibility artifacts from a left orbital roof mesh and surgical clips. Sinuses/Orbits: Left orbital compartments mass along the posterior aspect of the left lateral wall of orbit measuring 15 x 15 mm (series 18, image 23). The mass displaces rightward the optic nerve. Other: None. MRA HEAD FINDINGS Internal carotid arteries:  Patent. Anterior cerebral arteries:  Patent. Middle cerebral arteries: Patent. Anterior communicating artery: Not identified, likely hypoplastic or absent. Posterior communicating arteries:  Patent.  Bilateral fetal PCA. Posterior cerebral arteries:  Patent. Basilar artery:  Patent. Vertebral arteries:  Patent.  Right dominant. No evidence of high-grade stenosis, large vessel occlusion, or aneurysm unless noted above. Left upper cervical ICA hairpin turn. IMPRESSION: 1. No acute intracranial abnormality identified. 2. Mild for age chronic microvascular ischemic changes and volume loss of the brain. 3. Left orbital compartment mass along the posterior aspect of the lateral wall measuring up to 15 mm, grossly stable from prior studies given differences in technique. 4. Patent anterior and posterior intracranial circulation. No large vessel occlusion, aneurysm, or significant stenosis. Electronically Signed   By: Kristine Garbe M.D.   On: 01/30/2018 05:04   Mr Jodene Nam Head Wo Contrast  Result Date: 01/30/2018 CLINICAL DATA:  67 y/o F; episode of right-sided weakness. History of dementia. EXAM: MRI HEAD WITHOUT CONTRAST MRA HEAD WITHOUT CONTRAST TECHNIQUE: Multiplanar, multiecho pulse sequences of the brain and surrounding structures were obtained without intravenous contrast. Angiographic images of the head were obtained using MRA technique without contrast. COMPARISON:  01/29/2018, 05/26/2012, 03/01/2013 CT of the head. 03/02/2012 MRI of the orbits. FINDINGS: MRI HEAD FINDINGS Brain: Mild motion  degradation of several sequences. No acute infarction, hemorrhage, hydrocephalus, extra-axial collection or mass lesion. Few nonspecific foci of T2 FLAIR hyperintensity predominantly in periventricular white matter is compatible with mild chronic microvascular ischemic changes and there is mild nonfocal volume loss of the brain. Vascular: As below. Skull and upper cervical spine: Chronic left frontal and sphenoid bone postsurgical changes with susceptibility artifacts from a left orbital roof mesh and surgical clips. Sinuses/Orbits: Left orbital compartments mass along the posterior aspect of the left lateral wall of orbit measuring 15 x 15 mm (series 18, image 23). The mass displaces rightward the optic nerve. Other: None. MRA HEAD FINDINGS Internal carotid arteries:  Patent. Anterior cerebral arteries:  Patent. Middle cerebral arteries: Patent. Anterior communicating artery: Not identified, likely hypoplastic or absent. Posterior communicating arteries:  Patent.  Bilateral fetal PCA. Posterior cerebral arteries:  Patent. Basilar artery:  Patent. Vertebral arteries:  Patent.  Right dominant. No evidence of high-grade stenosis, large vessel occlusion, or aneurysm unless noted above. Left upper cervical ICA hairpin turn. IMPRESSION: 1. No acute intracranial abnormality identified. 2. Mild for age chronic microvascular ischemic changes and volume loss of the brain. 3. Left orbital compartment mass along the posterior aspect of the lateral wall measuring up to 15 mm, grossly stable from prior studies given differences in technique. 4. Patent anterior and  posterior intracranial circulation. No large vessel occlusion, aneurysm, or significant stenosis. Electronically Signed   By: Kristine Garbe M.D.   On: 01/30/2018 05:04     PERTINENT LAB RESULTS: CBC: Recent Labs    01/29/18 1906 01/30/18 0226  WBC 6.7 6.7  HGB 13.3 13.2  HCT 42.8 41.0  PLT 301 285   CMET CMP     Component Value Date/Time    NA 143 01/29/2018 1906   K 4.4 01/29/2018 1906   CL 109 01/29/2018 1906   CO2 25 01/29/2018 1906   GLUCOSE 113 (H) 01/29/2018 1906   BUN 18 01/29/2018 1906   CREATININE 0.74 01/30/2018 0226   CALCIUM 9.3 01/29/2018 1906   PROT 6.7 01/29/2018 1906   ALBUMIN 4.0 01/29/2018 1906   AST 14 (L) 01/29/2018 1906   ALT 9 01/29/2018 1906   ALKPHOS 70 01/29/2018 1906   BILITOT 0.5 01/29/2018 1906   GFRNONAA >60 01/30/2018 0226   GFRAA >60 01/30/2018 0226    GFR CrCl cannot be calculated (Unknown ideal weight.). No results for input(s): LIPASE, AMYLASE in the last 72 hours. Recent Labs    01/30/18 0548  TROPONINI <0.03   Invalid input(s): POCBNP No results for input(s): DDIMER in the last 72 hours. Recent Labs    01/30/18 0226  HGBA1C 5.8*   Recent Labs    01/30/18 0220  CHOL 167  HDL 47  LDLCALC 109*  TRIG 56  CHOLHDL 3.6   No results for input(s): TSH, T4TOTAL, T3FREE, THYROIDAB in the last 72 hours.  Invalid input(s): FREET3 No results for input(s): VITAMINB12, FOLATE, FERRITIN, TIBC, IRON, RETICCTPCT in the last 72 hours. Coags: Recent Labs    01/29/18 1906  INR 1.03   Microbiology: No results found for this or any previous visit (from the past 240 hour(s)).  FURTHER DISCHARGE INSTRUCTIONS:  Get Medicines reviewed and adjusted: Please take all your medications with you for your next visit with your Primary MD  Laboratory/radiological data: Please request your Primary MD to go over all hospital tests and procedure/radiological results at the follow up, please ask your Primary MD to get all Hospital records sent to his/her office.  In some cases, they will be blood work, cultures and biopsy results pending at the time of your discharge. Please request that your primary care M.D. goes through all the records of your hospital data and follows up on these results.  Also Note the following: If you experience worsening of your admission symptoms, develop shortness  of breath, life threatening emergency, suicidal or homicidal thoughts you must seek medical attention immediately by calling 911 or calling your MD immediately  if symptoms less severe.  You must read complete instructions/literature along with all the possible adverse reactions/side effects for all the Medicines you take and that have been prescribed to you. Take any new Medicines after you have completely understood and accpet all the possible adverse reactions/side effects.   Do not drive when taking Pain medications or sleeping medications (Benzodaizepines)  Do not take more than prescribed Pain, Sleep and Anxiety Medications. It is not advisable to combine anxiety,sleep and pain medications without talking with your primary care practitioner  Special Instructions: If you have smoked or chewed Tobacco  in the last 2 yrs please stop smoking, stop any regular Alcohol  and or any Recreational drug use.  Wear Seat belts while driving.  Please note: You were cared for by a hospitalist during your hospital stay. Once you are discharged, your primary care  physician will handle any further medical issues. Please note that NO REFILLS for any discharge medications will be authorized once you are discharged, as it is imperative that you return to your primary care physician (or establish a relationship with a primary care physician if you do not have one) for your post hospital discharge needs so that they can reassess your need for medications and monitor your lab values.  Total Time spent coordinating discharge including counseling, education and face to face time equals35 minutes.  SignedOren Binet 01/31/2018 10:09 AM

## 2018-02-08 ENCOUNTER — Telehealth: Payer: Self-pay | Admitting: Neurology

## 2018-02-08 DIAGNOSIS — G459 Transient cerebral ischemic attack, unspecified: Secondary | ICD-10-CM | POA: Diagnosis not present

## 2018-02-08 DIAGNOSIS — I10 Essential (primary) hypertension: Secondary | ICD-10-CM | POA: Diagnosis not present

## 2018-02-08 NOTE — Telephone Encounter (Signed)
She will be a new pt for memory loss.  Please schedule her separately for stroke in stroke clinic.

## 2018-02-08 NOTE — Telephone Encounter (Signed)
This patient last saw you in 2015. They have been referred to you by Dr Shelia Media for memory and has an appointment scheduled for 03/09/18. They were also in the hospital and was discharged on 01/31/18 for [place]. Dr Erlinda Hong has put a referral in for the patient to f/u here in 4 weeks for tia. Should I schedule a separate appointment with Abigail Powell for 4 week f/u or can you just see patient for tia and memory on 03/09/18? Please advise

## 2018-03-02 DIAGNOSIS — H524 Presbyopia: Secondary | ICD-10-CM | POA: Diagnosis not present

## 2018-03-02 DIAGNOSIS — H05242 Constant exophthalmos, left eye: Secondary | ICD-10-CM | POA: Diagnosis not present

## 2018-03-09 ENCOUNTER — Encounter: Payer: Self-pay | Admitting: Neurology

## 2018-03-09 ENCOUNTER — Ambulatory Visit: Payer: Medicare HMO | Admitting: Neurology

## 2018-03-09 VITALS — BP 161/89 | HR 62 | Ht 62.5 in | Wt 170.0 lb

## 2018-03-09 DIAGNOSIS — R69 Illness, unspecified: Secondary | ICD-10-CM | POA: Diagnosis not present

## 2018-03-09 DIAGNOSIS — F0391 Unspecified dementia with behavioral disturbance: Secondary | ICD-10-CM

## 2018-03-09 MED ORDER — RIVASTIGMINE TARTRATE 1.5 MG PO CAPS: 1.5000 mg | ORAL_CAPSULE | Freq: Two times a day (BID) | ORAL | 5 refills | Status: DC

## 2018-03-09 NOTE — Progress Notes (Signed)
Subjective:    Patient ID: Abigail Powell is a 67 y.o. female.  HPI     Star Age, MD, PhD [facility] [address], [address] [place], [place] [address]  Dear Dr. Shelia Media,  I saw your patient, Abigail Powell, for re-consultation of her memory loss. The patient is accompanied by her daughter today. As you know, Ms. Shidler is a 67 year old right-handed woman with an underlying complicated medical history of vitamin D deficiency, hypertension, thyroid disease, reflux disease, insomnia, history of pneumonia, history of prior alcohol abuse, prior Dx of Guillain-Barr syndrome, diverticulosis, left eye tumor (meningioma with s/p resection), and obesity, whom I have seen over 4 years ago. The patient is accompanied by her daughter today. She has a longer standing history of memory loss. She was on Aricept in the past but no longer is on it. She is also no longer on Namenda generic. She was on long-acting Namenda before as well. Her daughter reports that patient had side effects on her memory medications including more mood irritability and hallucinations. Patient has a history of behavioral changes including irritability, verbal outbursts and paranoid delusions. These have not been apparent lately. She has lost weight over time. Some of the weight loss was related to the patient refusing to eat as she felt her food was poisoned. She also refused to take her memory medication at times because she felt that they were sleeping pills. Of note, she no longer takes any aspirin or Plavix. She was told to take aspirin and Plavix for about 3 weeks and then continue with Plavix only. Daughter reports that she did not request any refills on the Plavix. Patient reports good appetite currently and sleeping fairly well at night. She tries to stay active by walking inside the house and outside. She lives with her daughter and daughter's son who is 33 years old. She has had no recent headaches or  recurrence of focal neurological symptoms. She is widowed and has 2 children. She quit smoking in 1987 and does not currently utilize any alcohol and drinks caffeine in the form of coffee, about 2 cups per day on average. She had an interim hospitalization in September 2019 for TIA. MRI brain was negative for an acute stroke. She was advised to be on dual antiplatelet regimen for 3 weeks, then Plavix alone after that. She is currently not on any memory medication. I reviewed your office note from 12/01/2017, which you kindly included. She had a brain MRI and MRA head on 01/30/2018 while in hospital which I reviewed: IMPRESSION: 1. No acute intracranial abnormality identified. 2. Mild for age chronic microvascular ischemic changes and volume loss of the brain. 3. Left orbital compartment mass along the posterior aspect of the lateral wall measuring up to 15 mm, grossly stable from prior studies given differences in technique. 4. Patent anterior and posterior intracranial circulation. No large vessel occlusion, aneurysm, or significant stenosis.  Previously:   I saw her on 02/22/2014, at which time her daughter felt that she was stable. Patient was on Aricept 5 mg strength at the time and Namenda once daily 21 mg.   I saw her on 08/21/2013, at which time her daughter reported that the patient was no longer on Tapazole for her thyroid disease and she was going to start radioiodine treatment per Dr. Chalmers Cater. She had been stable with her memory and mobilizing with a cane. She had not fallen recently. Her memory was stable. I suggested she utilize  the cane for safety at all times. I suggested changing Namenda immediate release to Namenda long-acting once daily.  I saw her on 12/06/2012, at which time her MMSE was 23/30, clock drawing was 4 out of 4 and animal fluency was 10. Her weakness had improved and she was mobilizing with a 2 wheeled walker. She has a history of a left orbital tumor for which she  had surgery. I felt that she was overall stable or improved with regards to her weakness in her memory but she also has a history of thyroid disease and adjustments to her thyroid medications. I suggested she continue with her Namenda.   I first met her on 06/08/2012 at which time her daughter reported that the patient was diagnosed with Guillain-Barr syndrome in October 2013 and was diagnosed with dementia shortly thereafter. She went through rehab for her GBS.  She had had a UTI in September of last year treated with antibiotics and then had to have a cholecystectomy in October 2013 after which he gradually developed weakness. She was treated with IVIG for 5 days and had workup including CSF studies at [facility]. I then saw her back on 09/06/2012 and reviewed prior test results with her. She has been on Namenda. Head CT from October 2013 showed mild cerebral and cerebellar atrophy. She otherwise has a history of reflux disease, hypertension, hyperlipidemia, insomnia, hyperthyroidism, diverticulosis, gastric ulcer, pneumonia, anemia, cervical degenerative disc disease and orbital tumor for which she had eye surgery at [facility]. She has a remote history of alcohol abuse and I felt that her memory loss was due to a combination of things including metabolic encephalopathy, thyroid dysfunction, prior history of alcohol abuse. At her followup visit in April I felt she was less confused but still not fully oriented. She had recently had adjustment in her thyroid medicine. I did not suggest any new medications and wanted to follow her clinically. She finished PT/OT outpt and I reviewed the D/C summary from rehab. She has residual deficit and reached max. Rehab potential.  Her Past Medical History Is Significant For: Past Medical History:  Diagnosis Date  . Adult failure to thrive   . Alcohol abuse, unspecified   . Benign neoplasm of eye, part unspecified   . Benign tumor of eye    Behind the  left eye, unclear if benign  . Diverticulosis   . Essential hypertension, benign   . GERD (gastroesophageal reflux disease)   . H/O gastric ulcer   . Hx of Guillain-Barre syndrome 09/06/2012  . Hypertension   . Hypopotassemia   . Insomnia, unspecified   . Lack of coordination   . Memory loss 09/06/2012  . Muscle weakness (generalized)   . Other and unspecified hyperlipidemia   . Other quadriplegia and quadriparesis   . Pneumonia, organism unspecified(486)   . Pneumonia, organism unspecified(486) 2013   History of pneumonia  . Polyneuropathy in diabetes(357.2)    History of   . Symbolic dysfunction, unspecified   . Thyrotoxicosis without mention of goiter or other cause, without mention of thyrotoxic crisis or storm   . TIA (transient ischemic attack) 01/2018    Her Past Surgical History Is Significant For: Past Surgical History:  Procedure Laterality Date  . ABDOMINAL HYSTERECTOMY    . ABDOMINAL HYSTERECTOMY    . CHOLECYSTECTOMY  02/26/2012   Procedure: LAPAROSCOPIC CHOLECYSTECTOMY WITH INTRAOPERATIVE CHOLANGIOGRAM;  Surgeon: Madilyn Hook, DO;  Location: [facility];  Service: General;  Laterality: N/A;  . Orthoscopic knee surgery    .  TUMOR REMOVAL Left 08/07/12   behind left eye    Her Family History Is Significant For: Family History  Problem Relation Age of Onset  . Ovarian cancer Mother   . Prostate cancer Father   . Hypertension Unknown     Her Social History Is Significant For: Social History   Socioeconomic History  . Marital status: Legally Separated    Spouse name: Not on file  . Number of children: Not on file  . Years of education: Not on file  . Highest education level: Not on file  Occupational History  . Not on file  Social Needs  . Financial resource strain: Not on file  . Food insecurity:    Worry: Not on file    Inability: Not on file  . Transportation needs:    Medical: Not on file    Non-medical: Not on file  Tobacco Use  . Smoking status:  Never Smoker  . Smokeless tobacco: Never Used  Substance and Sexual Activity  . Alcohol use: Yes    Comment: pt's daughter reports pt drinks beer and scotch every day keeping a thermos filled all day; unsure of how much    . Drug use: No  . Sexual activity: Not on file  Lifestyle  . Physical activity:    Days per week: Not on file    Minutes per session: Not on file  . Stress: Not on file  Relationships  . Social connections:    Talks on phone: Not on file    Gets together: Not on file    Attends religious service: Not on file    Active member of club or organization: Not on file    Attends meetings of clubs or organizations: Not on file    Relationship status: Not on file  Other Topics Concern  . Not on file  Social History Narrative  . Not on file    Her Allergies Are:  Allergies  Allergen Reactions  . Phenergan [Promethazine Hcl] Shortness Of Breath  . Asa [Aspirin] Other (See Comments)    Causes ulcer  . Zofran [Ondansetron Hcl] Other (See Comments)    Unknown reaction per pt  :   Her Current Medications Are:  Outpatient Encounter Medications as of 03/09/2018  Medication Sig  . amLODipine (NORVASC) 5 MG tablet Take 1 tablet (5 mg total) by mouth daily.  Marland Kitchen atorvastatin (LIPITOR) 40 MG tablet Take 1 tablet (40 mg total) by mouth daily at 6 PM.  . Calcium Carbonate-Vitamin D (CALCIUM + D PO) Take 600 mg by mouth 2 (two) times daily.  . Cholecalciferol (VITAMIN D PO) Take 1 tablet by mouth daily.  . fish oil-omega-3 fatty acids 1000 MG capsule Take 1 capsule (1 g total) by mouth daily.  . vitamin B-12 (CYANOCOBALAMIN) 1000 MCG tablet Take 1,000 mcg by mouth daily.  Marland Kitchen VITAMIN E PO Take 1 capsule by mouth daily.  . metoprolol succinate (TOPROL-XL) 100 MG 24 hr tablet Take 1 tablet (100 mg total) by mouth daily. Take with or immediately following a meal.  . [DISCONTINUED] aspirin EC 81 MG EC tablet Take 1 tablet (81 mg total) by mouth daily. Aspirin and Plavix for 3 weeks,  followed by Plavix alone.  . [DISCONTINUED] clopidogrel (PLAVIX) 75 MG tablet Take 1 tablet (75 mg total) by mouth daily. Aspirin and Plavix for 3 weeks, followed by Plavix alone.   No facility-administered encounter medications on file as of 03/09/2018.   : Review of Systems:  Out  of a complete 14 point review of systems, all are reviewed and negative with the exception of these symptoms as listed below:  Review of Systems  Neurological:       Pt presents today to discuss her memory.    Objective:  Neurological Exam  Physical Exam Physical Examination:   Vitals:   03/09/18 1141  BP: (!) 161/89  Pulse: 62    General Examination: The patient is a very pleasant 67 y.o. female in no acute distress. She appears well-developed and well-nourished and well groomed.   HEENT exam: Pupils are reactive to light. She has bilateral proptosis and mild exophthalmos, worse on the left. Extraocular tracking is mildly limited. Face is otherwise symmetric with normal facial animation. Speech is clear. Neck is supple with full range of motion. Hearing is intact. Oropharynx is clear. Swallowing well preserved. There is no drooling. L eye is slightly deviated out. No nystagmus.  Chest is clear to auscultation bilaterally with no crackles or wheezing noted.  Heart sounds are normal without murmurs, rubs or gallops.  Abdomen is soft, nontender with normal bowel sounds appreciated.  Extremities: She has no pitting edema in the distal lower extremities bilaterally.  Skin is warm and dry. Neurologically: Mental status: The patient is awake, alert and orientedto self, date, season, and states. Her memory, attention, language and knowledge are impaired. She is able to answer questions appropriately. She does not seem confused, no hallucinations. .  On 12/06/12: MMSE 23/30 (was 22 in 1/14), CDT 4/4, AFT is 10. On 02/22/2014: MMSE 22/30, CDT: 4/4, AFT 15.  On 03/09/2018: MMSE: 18/30, CDT: 4/4, AFT:  8/min.  Cranial nerves are as described under HEENT exam. Motor exam: Normal bulk and tone is noted. Strength is globally 5-/5, Reflexes are 1+ in the UEs and absent in the knees and ankles. Sensory exam is intact to light touch, pinprick, temperature sense. Fine motor skills are fairly intact. She has no truncal or gait ataxia. She walks with a cane. She walks slowly and turns in 3 steps. Balance is mildly impaired.  Assessment and Plan:   In summary, Ms. Meleski is a 66 yo RH woman, with an underlying complicated medical history of hypertension, thyroid disease, reflux disease, insomnia, history of pneumonia, prior Hx of alcohol abuse, history of GBS, diverticulosis, left eye tumor with s/p surgery, who presents for consultation of her memory loss. Her memory loss has progressed over the past several years. She has been on Aricept in the past and also was on Namenda long-acting at one point, more recently on generic Namenda 10 mg twice a day. She is no longer on any of these medications and apparently had side effects which per her daughter were more behavioral in nature. I suggested we cautiously try generic Exelon. I did talk to him about potential side effects, expectations and limitations of these medications. She is also advised to talk to you about restarting the Plavix as recommended upon hospital discharge. We talked about the importance of physical activity, good nutrition and good hydration. I provided a new prescription for generic Exelon 1.5 mg strength one pill twice a day and suggested a 3 month follow-up with one of our nurse practitioners. We can consider increasing the dose at the next appointment at 3 mg twice a day. She had a brain MRI in September 2019 which showed fairly stable findings from before.  I answered all their questions today and the patient and her daughter were in agreement. Thank you very much  for allowing me to participate in the care of this nice patient. If I can be  of any further assistance to you please do not hesitate to call me at [phone].  Sincerely,   Star Age, MD, PhD

## 2018-03-09 NOTE — Patient Instructions (Addendum)
Please talk to Abigail Powell [facility] or Dr. Shelia Media about restarting the plavix, you were supposed to be on Plavix longterm per hospital instructions.  You had side effects with Aricept and namenda, as your daughter reports.  We can start another medication for your memory: Exelon 1.5 mg twice daily. Side effects may include dry eyes, dry mouth, confusion, low pulse, low blood pressure, also GI related side effects (nausea, vomiting, diarrhea, constipation), headaches; rare side effects may include hallucinations and seizures.  Please follow up in about 3 months with one of our nurse practitioners, and we will consider increasing the dose to 3 mg.

## 2018-03-10 DIAGNOSIS — H05242 Constant exophthalmos, left eye: Secondary | ICD-10-CM | POA: Diagnosis not present

## 2018-03-16 ENCOUNTER — Ambulatory Visit: Payer: Medicare HMO | Admitting: Adult Health

## 2018-06-20 ENCOUNTER — Ambulatory Visit: Payer: Medicare HMO | Admitting: Nurse Practitioner

## 2019-12-20 ENCOUNTER — Other Ambulatory Visit: Payer: Self-pay | Admitting: Internal Medicine

## 2019-12-20 DIAGNOSIS — Z1231 Encounter for screening mammogram for malignant neoplasm of breast: Secondary | ICD-10-CM

## 2020-01-08 ENCOUNTER — Other Ambulatory Visit: Payer: Self-pay

## 2020-01-08 ENCOUNTER — Ambulatory Visit
Admission: RE | Admit: 2020-01-08 | Discharge: 2020-01-08 | Disposition: A | Discharge status: Home / Self Care | Payer: Medicare HMO | Source: Ambulatory Visit | Attending: Internal Medicine | Admitting: Internal Medicine

## 2020-01-08 DIAGNOSIS — Z1231 Encounter for screening mammogram for malignant neoplasm of breast: Secondary | ICD-10-CM

## 2020-01-21 ENCOUNTER — Encounter: Payer: Self-pay | Admitting: Gastroenterology

## 2020-03-03 ENCOUNTER — Ambulatory Visit (AMBULATORY_SURGERY_CENTER): Payer: Self-pay

## 2020-03-03 ENCOUNTER — Other Ambulatory Visit: Payer: Self-pay

## 2020-03-03 VITALS — Ht 61.0 in | Wt 184.0 lb

## 2020-03-03 DIAGNOSIS — Z1211 Encounter for screening for malignant neoplasm of colon: Secondary | ICD-10-CM

## 2020-03-03 MED ORDER — PLENVU 140 G PO SOLR: 1.0000 | ORAL | 0 refills | Status: DC

## 2020-03-03 NOTE — Progress Notes (Signed)
Patient's daughter was present for pre visit appt today; No egg or soy allergy known to patient  No issues with past sedation with any surgeries or procedures---hx of Guillain Barre snydrome No intubation problems in the past  No FH of Malignant Hyperthermia No diet pills per patient No home 02 use per patient  No blood thinners per patient  Pt denies issues with constipation  No A fib or A flutter  COVID 19 guidelines implemented in [facility] today with Pt and RN  Coupon given to pt in [facility] today , Code to Pharmacy  COVID vaccines completed on 08/2019 per pt's daughter; Due to the COVID-19 pandemic we are asking patients to follow these guidelines. Please only bring one care partner. Please be aware that your care partner may wait in the car in the parking lot or if they feel like they will be too hot to wait in the car, they may wait in the lobby on the 4th floor. All care partners are required to wear a mask the entire time (we do not have any that we can provide them), they need to practice social distancing, and we will do a Covid check for all patient's and care partners when you arrive. Also we will check their temperature and your temperature. If the care partner waits in their car they need to stay in the parking lot the entire time and we will call them on their cell phone when the patient is ready for discharge so they can bring the car to the front of the building. Also all patient's will need to wear a mask into building.

## 2020-03-12 ENCOUNTER — Other Ambulatory Visit: Payer: Self-pay

## 2020-03-12 ENCOUNTER — Encounter: Payer: Self-pay | Admitting: Gastroenterology

## 2020-03-12 ENCOUNTER — Ambulatory Visit (AMBULATORY_SURGERY_CENTER): Payer: Medicare HMO | Admitting: Gastroenterology

## 2020-03-12 VITALS — BP 164/98 | HR 75 | Temp 98.3°F | Resp 17 | Ht 61.0 in | Wt 184.0 lb

## 2020-03-12 DIAGNOSIS — Z1211 Encounter for screening for malignant neoplasm of colon: Secondary | ICD-10-CM

## 2020-03-12 DIAGNOSIS — D122 Benign neoplasm of ascending colon: Secondary | ICD-10-CM | POA: Diagnosis not present

## 2020-03-12 DIAGNOSIS — D125 Benign neoplasm of sigmoid colon: Secondary | ICD-10-CM | POA: Diagnosis not present

## 2020-03-12 DIAGNOSIS — D120 Benign neoplasm of cecum: Secondary | ICD-10-CM

## 2020-03-12 MED ORDER — SODIUM CHLORIDE 0.9 % IV SOLN: 500.0000 mL | Freq: Once | INTRAVENOUS | Status: DC

## 2020-03-12 NOTE — Progress Notes (Signed)
Called to room to assist during endoscopic procedure.  Patient ID and intended procedure confirmed with present staff. Received instructions for my participation in the procedure from the performing physician.  

## 2020-03-12 NOTE — Patient Instructions (Addendum)
Thank you for allowing [place] to care for you today!  Await pathology results, approximately 7-10 days.   Recommend next colonoscopy in 3 years for surveillance.  Resume previous diet and medications today.  Return to your normal activities tomorrow.      YOU HAD AN ENDOSCOPIC PROCEDURE TODAY AT [facility]:   Refer to the procedure report that was given to you for any specific questions about what was found during the examination.  If the procedure report does not answer your questions, please call your gastroenterologist to clarify.  If you requested that your care partner not be given the details of your procedure findings, then the procedure report has been included in a sealed envelope for you to review at your convenience later.  YOU SHOULD EXPECT: Some feelings of bloating in the abdomen. Passage of more gas than usual.  Walking can help get rid of the air that was put into your GI tract during the procedure and reduce the bloating. If you had a lower endoscopy (such as a colonoscopy or flexible sigmoidoscopy) you may notice spotting of blood in your stool or on the toilet paper. If you underwent a bowel prep for your procedure, you may not have a normal bowel movement for a few days.  Please Note:  You might notice some irritation and congestion in your nose or some drainage.  This is from the oxygen used during your procedure.  There is no need for concern and it should clear up in a day or so.  SYMPTOMS TO REPORT IMMEDIATELY:   Following lower endoscopy (colonoscopy or flexible sigmoidoscopy):  Excessive amounts of blood in the stool  Significant tenderness or worsening of abdominal pains  Swelling of the abdomen that is new, acute  Fever of 100F or higher     For urgent or emergent issues, a gastroenterologist can be reached at any hour by calling [phone]. Do not use MyChart messaging for urgent concerns.    DIET:  We do recommend a small meal at  first, but then you may proceed to your regular diet.  Drink plenty of fluids but you should avoid alcoholic beverages for 24 hours.  ACTIVITY:  You should plan to take it easy for the rest of today and you should NOT DRIVE or use heavy machinery until tomorrow (because of the sedation medicines used during the test).    FOLLOW UP: Our staff will call the number listed on your records 48-72 hours following your procedure to check on you and address any questions or concerns that you may have regarding the information given to you following your procedure. If we do not reach you, we will leave a message.  We will attempt to reach you two times.  During this call, we will ask if you have developed any symptoms of COVID 19. If you develop any symptoms (ie: fever, flu-like symptoms, shortness of breath, cough etc.) before then, please call [phone].  If you test positive for Covid 19 in the 2 weeks post procedure, please call and report this information to [place].    If any biopsies were taken you will be contacted by phone or by letter within the next 1-3 weeks.  Please call us at [phone] if you have not heard about the biopsies in 3 weeks.    SIGNATURES/CONFIDENTIALITY: You and/or your care partner have signed paperwork which will be entered into your electronic medical record.  These signatures attest to the fact that that  the information above on your After Visit Summary has been reviewed and is understood.  Full responsibility of the confidentiality of this discharge information lies with you and/or your care-partner.

## 2020-03-12 NOTE — Op Note (Signed)
[facility] Patient Name: Abigail Powell Procedure Date: 03/12/2020 10:27 AM MRN: [id number] Endoscopist: Mauri Pole , MD Age: 69 Referring MD:  Date of Birth: [birth year] Gender: Female Account #: [id number] Procedure:                Colonoscopy Indications:              Screening for colorectal malignant neoplasm Medicines:                Monitored Anesthesia Care Procedure:                Pre-Anesthesia Assessment:                           - Prior to the procedure, a History and Physical                            was performed, and patient medications and                            allergies were reviewed. The patient's tolerance of                            previous anesthesia was also reviewed. The risks                            and benefits of the procedure and the sedation                            options and risks were discussed with the patient.                            All questions were answered, and informed consent                            was obtained. Prior Anticoagulants: The patient has                            taken no previous anticoagulant or antiplatelet                            agents. ASA Grade Assessment: II - A patient with                            mild systemic disease. After reviewing the risks                            and benefits, the patient was deemed in                            satisfactory condition to undergo the procedure.                           After obtaining informed consent, the colonoscope  was passed under direct vision. Throughout the                            procedure, the patient's blood pressure, pulse, and                            oxygen saturations were monitored continuously. The                            Colonoscope was introduced through the anus and                            advanced to the the cecum, identified by                            appendiceal orifice and  ileocecal valve. The                            colonoscopy was performed without difficulty. The                            patient tolerated the procedure well. The quality                            of the bowel preparation was good. The ileocecal                            valve, appendiceal orifice, and rectum were                            photographed. Scope In: 10:36:40 AM Scope Out: 10:50:55 AM Scope Withdrawal Time: 0 hours 10 minutes 2 seconds  Total Procedure Duration: 0 hours 14 minutes 15 seconds  Findings:                 The perianal and digital rectal examinations were                            normal.                           Five sessile polyps were found in the sigmoid                            colon, ascending colon and cecum. The polyps were 5                            to 12 mm in size. These polyps were removed with a                            cold snare. Resection and retrieval were complete.                           Scattered small and large-mouthed diverticula were  found in the sigmoid colon, descending colon,                            transverse colon and ascending colon.                           Non-bleeding internal hemorrhoids were found during                            retroflexion. The hemorrhoids were medium-sized. Complications:            No immediate complications. Estimated Blood Loss:     Estimated blood loss was minimal. Impression:               - Five 5 to 12 mm polyps in the sigmoid colon, in                            the ascending colon and in the cecum, removed with                            a cold snare. Resected and retrieved.                           - Moderate diverticulosis in the sigmoid colon, in                            the descending colon, in the transverse colon and                            in the ascending colon.                           - Non-bleeding internal  hemorrhoids. Recommendation:           - Patient has a contact number available for                            emergencies. The signs and symptoms of potential                            delayed complications were discussed with the                            patient. Return to normal activities tomorrow.                            Written discharge instructions were provided to the                            patient.                           - Resume previous diet.                           - Continue present medications.                           -  Await pathology results.                           - Repeat colonoscopy in 3 years for surveillance                            based on pathology results. Mauri Pole, MD 03/12/2020 10:56:44 AM This report has been signed electronically.

## 2020-03-12 NOTE — Progress Notes (Signed)
Pt's states no medical or surgical changes since previsit or office visit. 

## 2020-03-12 NOTE — Progress Notes (Signed)
Report to PACU, RN, vss, BBS= Clear.  

## 2020-03-14 ENCOUNTER — Telehealth: Payer: Self-pay

## 2020-03-14 NOTE — Telephone Encounter (Signed)
LVM

## 2020-03-24 ENCOUNTER — Encounter: Payer: Self-pay | Admitting: Gastroenterology

## 2020-12-16 ENCOUNTER — Other Ambulatory Visit: Payer: Self-pay | Admitting: Internal Medicine

## 2020-12-16 DIAGNOSIS — Z1231 Encounter for screening mammogram for malignant neoplasm of breast: Secondary | ICD-10-CM

## 2021-02-04 ENCOUNTER — Ambulatory Visit
Admission: RE | Admit: 2021-02-04 | Discharge: 2021-02-04 | Disposition: A | Discharge status: Home / Self Care | Payer: Medicare HMO | Source: Ambulatory Visit | Attending: Internal Medicine | Admitting: Internal Medicine

## 2021-02-04 ENCOUNTER — Other Ambulatory Visit: Payer: Self-pay

## 2021-02-04 DIAGNOSIS — Z1231 Encounter for screening mammogram for malignant neoplasm of breast: Secondary | ICD-10-CM

## 2021-02-09 ENCOUNTER — Other Ambulatory Visit: Payer: Self-pay | Admitting: Internal Medicine

## 2021-02-09 DIAGNOSIS — R928 Other abnormal and inconclusive findings on diagnostic imaging of breast: Secondary | ICD-10-CM

## 2021-02-25 ENCOUNTER — Ambulatory Visit
Admission: RE | Admit: 2021-02-25 | Discharge: 2021-02-25 | Disposition: A | Discharge status: Home / Self Care | Payer: Medicare HMO | Source: Ambulatory Visit | Attending: Internal Medicine | Admitting: Internal Medicine

## 2021-02-25 ENCOUNTER — Other Ambulatory Visit: Payer: Self-pay

## 2021-02-25 DIAGNOSIS — R928 Other abnormal and inconclusive findings on diagnostic imaging of breast: Secondary | ICD-10-CM

## 2021-12-09 ENCOUNTER — Other Ambulatory Visit: Payer: Self-pay | Admitting: Family Medicine

## 2021-12-09 DIAGNOSIS — Z1231 Encounter for screening mammogram for malignant neoplasm of breast: Secondary | ICD-10-CM

## 2022-02-05 ENCOUNTER — Ambulatory Visit: Payer: Medicare HMO

## 2022-03-09 ENCOUNTER — Ambulatory Visit
Admission: RE | Admit: 2022-03-09 | Discharge: 2022-03-09 | Disposition: A | Discharge status: Home / Self Care | Payer: Medicare HMO | Source: Ambulatory Visit | Attending: Family Medicine | Admitting: Family Medicine

## 2022-03-09 DIAGNOSIS — Z1231 Encounter for screening mammogram for malignant neoplasm of breast: Secondary | ICD-10-CM

## 2022-06-18 ENCOUNTER — Ambulatory Visit: Payer: Medicare HMO | Attending: Internal Medicine | Admitting: Internal Medicine

## 2022-06-18 ENCOUNTER — Encounter: Payer: Self-pay | Admitting: Internal Medicine

## 2022-06-18 VITALS — BP 154/86 | HR 67 | Temp 97.7°F | Ht 61.0 in | Wt 197.0 lb

## 2022-06-18 DIAGNOSIS — Z8673 Personal history of transient ischemic attack (TIA), and cerebral infarction without residual deficits: Secondary | ICD-10-CM

## 2022-06-18 DIAGNOSIS — G300 Alzheimer's disease with early onset: Secondary | ICD-10-CM | POA: Diagnosis not present

## 2022-06-18 DIAGNOSIS — Z86018 Personal history of other benign neoplasm: Secondary | ICD-10-CM

## 2022-06-18 DIAGNOSIS — F02A0 Dementia in other diseases classified elsewhere, mild, without behavioral disturbance, psychotic disturbance, mood disturbance, and anxiety: Secondary | ICD-10-CM | POA: Diagnosis not present

## 2022-06-18 DIAGNOSIS — Z8639 Personal history of other endocrine, nutritional and metabolic disease: Secondary | ICD-10-CM

## 2022-06-18 DIAGNOSIS — I10 Essential (primary) hypertension: Secondary | ICD-10-CM | POA: Diagnosis not present

## 2022-06-18 DIAGNOSIS — F1021 Alcohol dependence, in remission: Secondary | ICD-10-CM

## 2022-06-18 DIAGNOSIS — E538 Deficiency of other specified B group vitamins: Secondary | ICD-10-CM

## 2022-06-18 DIAGNOSIS — Z7689 Persons encountering health services in other specified circumstances: Secondary | ICD-10-CM

## 2022-06-18 MED ORDER — AMLODIPINE BESYLATE 5 MG PO TABS: 5.0000 mg | ORAL_TABLET | Freq: Every day | ORAL | 1 refills | Status: DC

## 2022-06-18 MED ORDER — ATORVASTATIN CALCIUM 20 MG PO TABS: 20.0000 mg | ORAL_TABLET | Freq: Every day | ORAL | 1 refills | Status: DC

## 2022-06-18 NOTE — Patient Instructions (Signed)

## 2022-06-18 NOTE — Progress Notes (Signed)
Patient ID: Abigail Powell, female    DOB: [birth year]  MRN: [id number]  CC: Establish Care (Est care / new pt. Orion Crook HTN./No to flu vax.)   Subjective: Abigail Powell is a 72 y.o. female who presents for new pt visit.   [name] is with her Her concerns today include:  Patient with history of HTN, TIA 2019, EtOH use disorder, HL, GBS, dementia, hyperthyroidism, left eye tumor status post resection meningioma  Previous PCP was Dr. Deland Pretty.  Last seen sometime before COVID pandemic. Pt lives with daughter.  Confirms hx of dementia, GBS, HTN, TIA, HL, ETOH use disorder and benign tumor resected from LT eye 07/2012.  Told that they did not get all of the tumor and that it may grow back.  Pt denies any vision issues at this time.  Last eye exam was over 4 yrs ago.  HTN: was on Norvasc and Metoprolol XL 100 mg but out for yrs.  Daughter has been giving her some of her own Norvasc  5 mg.  Did not take one today before coming to appt.  Not limiting salt in the foods TIA:  not on ASA; out of Lipitor.  Daughter states she has hx of stomach ulcers ETOH UD:  clean x10 yrs.  She was a heavy drinker Dementia:  last saw Dr. Rexene Alberts 02/2018. Had tried her with several meds including Aricept and Namenda which she did not tolerate due to behavioral changes.  The last medication that was prescribed was Exelon.  Daughter does not recall how she did with Exelon Daughter reports that overall memory issues has not significantly progressed.  She notes that the patient has poor short term memory but can remember things from yrs ago.   No safety issues at home, no wandering and does not drive.  Independent with ADLs.  Sleeping okay unless there is a change in her routine eg does not sleep well when she stays with her son for a few days.    Patient is taking vitamin D and B12.  Daughter reports she has history of deficiency on both.  HM:  No flu shot since hx of GBS.  Not sure if she has had shingles vaccine or  pneumonia vaccines    Patient Active Problem List   Diagnosis Date Noted   TIA (transient ischemic attack) 01/30/2018   Hx of Guillain-Barre syndrome 09/06/2012   Memory loss 09/06/2012   Quadriparesis ([place]) 03/07/2012   Alcohol abuse 03/06/2012   Subclinical hyperthyroidism 03/03/2012   Hypokalemia 03/03/2012   Transaminitis 03/03/2012   Encephalopathy acute 03/01/2012   Weakness 03/01/2012   Hypertension 03/01/2012   Hyperlipidemia 03/01/2012     Current Outpatient Medications on File Prior to Visit  Medication Sig Dispense Refill   Calcium Carbonate-Vitamin D (CALCIUM + D PO) Take 600 mg by mouth 2 (two) times daily.     Cholecalciferol (VITAMIN D PO) Take 1 tablet by mouth daily.     PEG-KCl-NaCl-NaSulf-Na Asc-C (PLENVU) 140 g SOLR Take 1 kit by mouth as directed. Manufacturer's coupon Universal coupon code:BIN: C1589615; GROUP: [id number]; PCN: [id number]; IDCY:[id number]; PAY NO MORE $50 1 each 0   vitamin B-12 (CYANOCOBALAMIN) 1000 MCG tablet Take 1,000 mcg by mouth daily.     VITAMIN E PO Take 1 capsule by mouth daily.     No current facility-administered medications on file prior to visit.    Allergies  Allergen Reactions   Phenergan [Promethazine Hcl] Shortness Of Breath   Asa [Aspirin]  Other (See Comments)    Causes ulcer   Zofran [Ondansetron Hcl] Other (See Comments)    Unknown reaction per pt    Social History   Socioeconomic History   Marital status: Legally Separated    Spouse name: Not on file   Number of children: Not on file   Years of education: Not on file   Highest education level: Not on file  Occupational History   Not on file  Tobacco Use   Smoking status: Former   Smokeless tobacco: Never  Vaping Use   Vaping Use: Never used  Substance and Sexual Activity   Alcohol use: Yes    Comment: pt's daughter reports pt drinks beer and scotch every day keeping a thermos filled all day; unsure of how much     Drug use: No   Sexual activity: Not on  file  Other Topics Concern   Not on file  Social History Narrative   Not on file   Social Determinants of Health   Financial Resource Strain: Not on file  Food Insecurity: Not on file  Transportation Needs: Not on file  Physical Activity: Not on file  Stress: Not on file  Social Connections: Not on file  Intimate Partner Violence: Not on file    Family History  Problem Relation Age of Onset   Ovarian cancer Mother    Prostate cancer Father    Alcoholism Father    Hypertension Other    Colon polyps Neg Hx    Colon cancer Neg Hx    Esophageal cancer Neg Hx    Stomach cancer Neg Hx    Rectal cancer Neg Hx     Past Surgical History:  Procedure Laterality Date   ABDOMINAL HYSTERECTOMY     CHOLECYSTECTOMY  02/26/2012   Procedure: LAPAROSCOPIC CHOLECYSTECTOMY WITH INTRAOPERATIVE CHOLANGIOGRAM;  Surgeon: Madilyn Hook, DO;  Location: [facility];  Service: General;  Laterality: N/A;   Orthoscopic knee surgery     TUMOR REMOVAL Left 08/07/12   behind left eye   WISDOM TOOTH EXTRACTION      ROS: Review of Systems Negative except as stated above  PHYSICAL EXAM: BP (!) 154/86 (BP Location: Left Arm, Patient Position: Sitting, Cuff Size: Normal)   Pulse 67   Temp 97.7 F (36.5 C) (Oral)   Ht 5' 1"$  (1.549 m)   Wt 197 lb (89.4 kg)   SpO2 97%   BMI 37.22 kg/m   Physical Exam  General appearance - alert, well appearing, pleasant elderly female and in no distress Mental status -patient answers most questions appropriately but daughter assists greatly with history.  She follows commands appropriately. Eyes -Pink conjunctiva.  Extraocular movement intact. Mouth - mucous membranes moist, pharynx normal without lesions Neck - supple, no significant adenopathy Chest - clear to auscultation, no wheezes, rales or rhonchi, symmetric air entry Heart - normal rate, regular rhythm, normal S1, S2, no murmurs, rubs, clicks or gallops Extremities - peripheral pulses normal, no pedal edema,  no clubbing or cyanosis She does not have an assistive device.     Latest Ref Rng & Units 01/30/2018    2:26 AM 01/29/2018    7:06 PM 05/26/2012    6:51 PM  CMP  Glucose 70 - 99 mg/dL  113  83   BUN 8 - 23 mg/dL  18  19   Creatinine 0.44 - 1.00 mg/dL 0.74  0.88  0.63   Sodium 135 - 145 mmol/L  143  141   Potassium 3.5 -  5.1 mmol/L  4.4  4.1   Chloride 98 - 111 mmol/L  109  108   CO2 22 - 32 mmol/L  25  22   Calcium 8.9 - 10.3 mg/dL  9.3  9.4   Total Protein 6.5 - 8.1 g/dL  6.7  6.3   Total Bilirubin 0.3 - 1.2 mg/dL  0.5  0.1   Alkaline Phos 38 - 126 U/L  70  96   AST 15 - 41 U/L  14  25   ALT 0 - 44 U/L  9  14    Lipid Panel     Component Value Date/Time   CHOL 167 01/30/2018 0220   TRIG 56 01/30/2018 0220   HDL 47 01/30/2018 0220   CHOLHDL 3.6 01/30/2018 0220   VLDL 11 01/30/2018 0220   LDLCALC 109 (H) 01/30/2018 0220    CBC    Component Value Date/Time   WBC 6.7 01/30/2018 0226   RBC 4.22 01/30/2018 0226   HGB 13.2 01/30/2018 0226   HCT 41.0 01/30/2018 0226   PLT 285 01/30/2018 0226   MCV 97.2 01/30/2018 0226   MCH 31.3 01/30/2018 0226   MCHC 32.2 01/30/2018 0226   RDW 12.2 01/30/2018 0226   LYMPHSABS 2.0 01/29/2018 1906   MONOABS 0.7 01/29/2018 1906   EOSABS 0.1 01/29/2018 1906   BASOSABS 0.0 01/29/2018 1906    ASSESSMENT AND PLAN: 1. Establishing care with new doctor, encounter for Patient's daughter does not recall where Dr. Katrine Coho office is for [place] to request records  2. Essential hypertension Not at goal.  Previously on metoprolol and Norvasc but has been off the metoprolol for years.  Daughter has been giving her some of her on Norvasc 5 mg tablets but was not given 1 this morning.  We will put her on amlodipine 5 mg.  Continue DASH diet.  Plan to recheck blood pressure on subsequent visit.  She does not have a home blood pressure monitoring device. - CBC - Comprehensive metabolic panel - Lipid panel - amLODipine (NORVASC) 5 MG tablet; Take 1  tablet (5 mg total) by mouth daily.  Dispense: 90 tablet; Refill: 1  3. Mild early onset Alzheimer's dementia without behavioral disturbance, psychotic disturbance, mood disturbance, or anxiety [facility] Encourage patient and daughter to continue to have her follow-up daily routine which it sounds as though her daughter has been doing.  Patient has not had any behavioral issues.  We will continue to observe off medications for now.  4. Severe alcohol use disorder, in sustained remission ([place]) Commended her on quitting and encouraged her to remain free of alcohol  5. History of transient ischemic attack (TIA) Ideally should be on aspirin as well but daughter states that she has history of ulcers. - atorvastatin (LIPITOR) 20 MG tablet; Take 1 tablet (20 mg total) by mouth daily at 6 PM.  Dispense: 90 tablet; Refill: 1  6. History of benign eye tumor - Ambulatory referral to Ophthalmology  7. Vitamin B 12 deficiency 8. History of vitamin D deficiency I forgot to order the vitamin B12 and vitamin D levels today with chemistry.  Message sent to the lab tech to see if these can be added.  If not we will plan to check on subsequent visit.  I will check with ID to see whether pneumonia and shingles vaccine can be given given her history of GBS.   Patient was given the opportunity to ask questions.  Patient verbalized understanding of the plan and was  able to repeat key elements of the plan.   This documentation was completed using Radio producer.  Any transcriptional errors are unintentional.  Orders Placed This Encounter  Procedures   CBC   Comprehensive metabolic panel   Lipid panel   Ambulatory referral to Ophthalmology     Requested Prescriptions   Signed Prescriptions Disp Refills   amLODipine (NORVASC) 5 MG tablet 90 tablet 1    Sig: Take 1 tablet (5 mg total) by mouth daily.   atorvastatin (LIPITOR) 20 MG tablet 90 tablet 1    Sig: Take 1 tablet (20 mg total)  by mouth daily at 6 PM.    Return in about 5 weeks (around 07/23/2022) for With me in 5 wks for Medicare Wellness and recheck BP.  Karle Plumber, MD, FACP

## 2022-06-19 ENCOUNTER — Telehealth: Payer: Self-pay | Admitting: Internal Medicine

## 2022-06-19 LAB — COMPREHENSIVE METABOLIC PANEL
ALT: 12 IU/L (ref 0–32)
AST: 18 IU/L (ref 0–40)
Albumin/Globulin Ratio: 1.9 (ref 1.2–2.2)
Albumin: 4.6 g/dL (ref 3.8–4.8)
Alkaline Phosphatase: 88 IU/L (ref 44–121)
BUN/Creatinine Ratio: 24 (ref 12–28)
BUN: 25 mg/dL (ref 8–27)
Bilirubin Total: 0.4 mg/dL (ref 0.0–1.2)
CO2: 22 mmol/L (ref 20–29)
Calcium: 9.7 mg/dL (ref 8.7–10.3)
Chloride: 105 mmol/L (ref 96–106)
Creatinine, Ser: 1.05 mg/dL — ABNORMAL HIGH (ref 0.57–1.00)
Globulin, Total: 2.4 g/dL (ref 1.5–4.5)
Glucose: 82 mg/dL (ref 70–99)
Potassium: 5.3 mmol/L — ABNORMAL HIGH (ref 3.5–5.2)
Sodium: 143 mmol/L (ref 134–144)
Total Protein: 7 g/dL (ref 6.0–8.5)
eGFR: 56 mL/min/{1.73_m2} — ABNORMAL LOW (ref >59)

## 2022-06-19 LAB — LIPID PANEL
Chol/HDL Ratio: 3.2 ratio (ref 0.0–4.4)
Cholesterol, Total: 164 mg/dL (ref 100–199)
HDL: 51 mg/dL (ref >39)
LDL Chol Calc (NIH): 96 mg/dL (ref 0–99)
Triglycerides: 94 mg/dL (ref 0–149)
VLDL Cholesterol Cal: 17 mg/dL (ref 5–40)

## 2022-06-19 LAB — CBC
Hematocrit: 43.5 % (ref 34.0–46.6)
Hemoglobin: 14.6 g/dL (ref 11.1–15.9)
MCH: 31.6 pg (ref 26.6–33.0)
MCHC: 33.6 g/dL (ref 31.5–35.7)
MCV: 94 fL (ref 79–97)
Platelets: 283 10*3/uL (ref 150–450)
RBC: 4.62 x10E6/uL (ref 3.77–5.28)
RDW: 11.9 % (ref 11.7–15.4)
WBC: 5.3 10*3/uL (ref 3.4–10.8)

## 2022-06-19 NOTE — Telephone Encounter (Signed)
Phone call placed to patient's daughter Davy Pique this morning to go over lab results. I informed her that the patient's cholesterol level was good but she should continue taking atorvastatin given her history of TIA in the past. Kidney function is not at 100% and has declined since last check in 2019.  Stressed the importance of good blood pressure control.  Also advised to avoid any over-the-counter NSAIDs.  This is something we will need to continue to monitor over time. Potassium level mildly elevated at 5.3.  Patient is not taking any over-the-counter potassium supplements.  I recommend trying to cut back on potassium rich foods like oranges, orange juice and bananas.  We will recheck potassium level on her next visit with me in March. Blood cell counts normal. All questions were answered.

## 2022-06-22 LAB — SPECIMEN STATUS REPORT

## 2022-06-22 LAB — VITAMIN B12: Vitamin B-12: 526 pg/mL (ref 232–1245)

## 2022-06-22 LAB — VITAMIN D 25 HYDROXY (VIT D DEFICIENCY, FRACTURES): Vit D, 25-Hydroxy: 17 ng/mL — ABNORMAL LOW (ref 30.0–100.0)

## 2022-06-27 NOTE — Progress Notes (Signed)
Let patient's daughter know that I recommend  increase the vitamin D to 1000 IU daily.  This can be purchased over-the-counter.

## 2022-07-22 ENCOUNTER — Ambulatory Visit: Payer: Medicare HMO | Attending: Internal Medicine | Admitting: Internal Medicine

## 2022-07-22 ENCOUNTER — Encounter: Payer: Self-pay | Admitting: Internal Medicine

## 2022-07-22 VITALS — BP 140/90 | HR 66 | Temp 97.9°F | Ht 61.0 in | Wt 198.0 lb

## 2022-07-22 DIAGNOSIS — Z0000 Encounter for general adult medical examination without abnormal findings: Secondary | ICD-10-CM | POA: Diagnosis not present

## 2022-07-22 DIAGNOSIS — Z1159 Encounter for screening for other viral diseases: Secondary | ICD-10-CM

## 2022-07-22 DIAGNOSIS — Z6837 Body mass index (BMI) 37.0-37.9, adult: Secondary | ICD-10-CM

## 2022-07-22 DIAGNOSIS — Z780 Asymptomatic menopausal state: Secondary | ICD-10-CM

## 2022-07-22 DIAGNOSIS — Z23 Encounter for immunization: Secondary | ICD-10-CM

## 2022-07-22 DIAGNOSIS — Z7189 Other specified counseling: Secondary | ICD-10-CM

## 2022-07-22 DIAGNOSIS — I10 Essential (primary) hypertension: Secondary | ICD-10-CM

## 2022-07-22 MED ORDER — AMLODIPINE BESYLATE 10 MG PO TABS: 10.0000 mg | ORAL_TABLET | Freq: Every day | ORAL | 1 refills | Status: DC

## 2022-07-22 NOTE — Patient Instructions (Signed)
Increase Norvasc to 10 mg daily.  If you execute a living will or healthcare power of attorney, please bring a copy for our records.

## 2022-07-22 NOTE — Progress Notes (Signed)
Subjective:   Abigail Powell is a 72 y.o. female who presents for Medicare Annual (Subsequent) preventive examination.  [name] is with her. Patient with history of HTN, TIA 2019, EtOH use disorder, HL, GBS, dementia, hyperthyroidism, left eye tumor status post resection meningioma   Review of Systems    CV:  started on Norvasc 5 mg on last visit.  Taking and took already this a.m.  No device to check BP       Objective:    Today's Vitals   07/22/22 1011 07/22/22 1111  BP: (!) 172/85 (!) 140/90  Pulse: 66   Temp: 97.9 F (36.6 C)   TempSrc: Oral   SpO2: 97%   Weight: 198 lb (89.8 kg)   Height: '5\' 1"'$  (1.549 m)    Body mass index is 37.41 kg/m.     07/22/2022    9:57 AM 01/29/2018    7:00 PM 03/01/2012   11:19 PM 03/01/2012    8:54 PM 02/26/2012   11:00 AM 02/26/2012   12:49 AM  Advanced Directives  Does Patient Have a Medical Advance Directive? No No  Patient does not have advance directive;Patient would not like information Patient does not have advance directive Patient does not have advance directive  Would patient like information on creating a medical advance directive? Yes (MAU/Ambulatory/Procedural Areas - Information given) No - Patient declined      Pre-existing out of facility DNR order (yellow form or pink MOST form)   No       Current Medications (verified) Outpatient Encounter Medications as of 07/22/2022  Medication Sig   atorvastatin (LIPITOR) 20 MG tablet Take 1 tablet (20 mg total) by mouth daily at 6 PM.   Calcium Carbonate-Vitamin D (CALCIUM + D PO) Take 600 mg by mouth 2 (two) times daily.   Cholecalciferol (VITAMIN D PO) Take 1 tablet by mouth daily.   PEG-KCl-NaCl-NaSulf-Na Asc-C (PLENVU) 140 g SOLR Take 1 kit by mouth as directed. Manufacturer's coupon Universal coupon code:BIN: [id number]; GROUP: [id number]; PCN: [id number]; IDDA:[id number]; PAY NO MORE $50   vitamin B-12 (CYANOCOBALAMIN) 1000 MCG tablet Take 1,000 mcg by mouth daily.   VITAMIN E PO  Take 1 capsule by mouth daily.   [DISCONTINUED] amLODipine (NORVASC) 5 MG tablet Take 1 tablet (5 mg total) by mouth daily.   amLODipine (NORVASC) 10 MG tablet Take 1 tablet (10 mg total) by mouth daily.   No facility-administered encounter medications on file as of 07/22/2022.    Allergies (verified) Phenergan [promethazine hcl], Asa [aspirin], and Zofran [ondansetron hcl]   History: Past Medical History:  Diagnosis Date   Adult failure to thrive    Alcohol abuse, unspecified    hx of   Allergy    seasonal allergies   Benign neoplasm of eye, part unspecified    Benign tumor of eye    Behind the left eye, unclear if benign   Blood transfusion without reported diagnosis    hx of   Diverticulosis    Essential hypertension, benign    GERD (gastroesophageal reflux disease)    on meds   H/O gastric ulcer    Hx of Guillain-Barre syndrome 09/06/2012   Hypertension    on meds   Hypopotassemia    Insomnia, unspecified    Lack of coordination    Memory loss 09/06/2012   Muscle weakness (generalized)    Other and unspecified hyperlipidemia    on meds   Other quadriplegia and quadriparesis    Pneumonia, organism  unspecified(486)    Pneumonia, organism unspecified(486) 2013   History of pneumonia   Polyneuropathy in diabetes(357.2)    History of    Symbolic dysfunction, unspecified    Thyrotoxicosis without mention of goiter or other cause, without mention of thyrotoxic crisis or storm    hx of   TIA (transient ischemic attack) 01/2018   Past Surgical History:  Procedure Laterality Date   ABDOMINAL HYSTERECTOMY     CHOLECYSTECTOMY  02/26/2012   Procedure: LAPAROSCOPIC CHOLECYSTECTOMY WITH INTRAOPERATIVE CHOLANGIOGRAM;  Surgeon: Madilyn Hook, DO;  Location: [facility];  Service: General;  Laterality: N/A;   Orthoscopic knee surgery     TUMOR REMOVAL Left 08/07/12   behind left eye   WISDOM TOOTH EXTRACTION     Family History  Problem Relation Age of Onset   Ovarian cancer  Mother    Prostate cancer Father    Alcoholism Father    Hypertension Other    Colon polyps Neg Hx    Colon cancer Neg Hx    Esophageal cancer Neg Hx    Stomach cancer Neg Hx    Rectal cancer Neg Hx    Social History   Socioeconomic History   Marital status: Legally Separated    Spouse name: Not on file   Number of children: Not on file   Years of education: Not on file   Highest education level: Not on file  Occupational History   Not on file  Tobacco Use   Smoking status: Former   Smokeless tobacco: Never  Vaping Use   Vaping Use: Never used  Substance and Sexual Activity   Alcohol use: Yes    Comment: pt's daughter reports pt drinks beer and scotch every day keeping a thermos filled all day; unsure of how much     Drug use: No   Sexual activity: Not on file  Other Topics Concern   Not on file  Social History Narrative   Not on file   Social Determinants of Health   Financial Resource Strain: Low Risk  (07/22/2022)   Overall Financial Resource Strain (CARDIA)    Difficulty of Paying Living Expenses: Not hard at all  Food Insecurity: No Food Insecurity (07/22/2022)   Hunger Vital Sign    Worried About Running Out of Food in the Last Year: Never true    Ran Out of Food in the Last Year: Never true  Transportation Needs: No Transportation Needs (07/22/2022)   PRAPARE - Hydrologist (Medical): No    Lack of Transportation (Non-Medical): No  Physical Activity: Insufficiently Active (07/22/2022)   Exercise Vital Sign    Days of Exercise per Week: 4 days    Minutes of Exercise per Session: 10 min  Stress: No Stress Concern Present (07/22/2022)   [place]    Feeling of Stress : Not at all  Social Connections: Moderately Integrated (07/22/2022)   Social Connection and Isolation Panel [NHANES]    Frequency of Communication with Friends and Family: Three times a week    Frequency  of Social Gatherings with Friends and Family: Three times a week    Attends Religious Services: More than 4 times per year    Active Member of Clubs or Organizations: Yes    Attends Archivist Meetings: More than 4 times per year    Marital Status: Widowed    Tobacco Counseling Former smoker.  Quit 30 yrs ago ETOH use in sustain  remission  Clinical Intake:     Pain : No/denies pain     Diabetes: No     Diabetic? Interpreter Needed?: No      Activities of Daily Living    07/22/2022   10:18 AM  In your present state of health, do you have any difficulty performing the following activities:  Hearing? 0  Vision? 0  Difficulty concentrating or making decisions? 1  Walking or climbing stairs? 0  Dressing or bathing? 0  Doing errands, shopping? 1  Preparing Food and eating ? N  Using the Toilet? N  In the past six months, have you accidently leaked urine? N  Do you have problems with loss of bowel control? N  Managing your Medications? Y  Managing your Finances? Y  Housekeeping or managing your Housekeeping? N    Patient Care Team: Ladell Pier, MD as PCP - General (Internal Medicine)  Indicate any recent Medical Services you may have received from other than [facility] providers in the past year (date may be approximate).     Assessment:   This is a routine wellness examination for [facility].  Hearing/Vision screen Whisper test was normal.  Dietary issues and exercise activities discussed:  Pt and daughter admit that she over eats. Daughter had to put lock on fridge at nights.  Eats out of boardom. Poor portion control; pt will not make wise decisions about food so daughter has to assist with this She walks around the apartment complex a few times a day within view of her daughter.  Daughter states the surroundings is safe.   Goals Addressed   None   Depression Screen    07/22/2022    9:57 AM  PHQ 2/9 Scores  PHQ - 2 Score 0    Fall Risk     07/22/2022    9:57 AM  [place] in the past year? 0  Number falls in past yr: 0  Injury with Fall? 0  Risk for fall due to : History of fall(s)    FALL RISK PREVENTION PERTAINING TO THE HOME:  Any stairs in or around the home? Yes  If so, are there any without handrails? No  Home free of loose throw rugs in walkways, pet beds, electrical cords, etc? Yes  Adequate lighting in your home to reduce risk of falls? Yes   ASSISTIVE DEVICES UTILIZED TO PREVENT FALLS:  Life alert? No  Use of a cane, walker or w/c? No  Grab bars in the bathroom? Yes  Shower chair or bench in shower? No  Elevated toilet seat or a handicapped toilet? No   TIMED UP AND GO:  Was the test performed? Yes .  Length of time to ambulate 10 feet: 11 sec.   Gait slow and steady without use of assistive device  Cognitive Function:    07/22/2022   10:15 AM 03/09/2018   11:51 AM 02/22/2014   12:01 PM  MMSE - Mini Mental State Exam  Orientation to time '1 2 2  '$ Orientation to Place '3 1 3  '$ Registration '3 3 3  '$ Attention/ Calculation '5 3 5  '$ Recall 1 0 0  Language- name 2 objects '2 2 2  '$ Language- repeat '1 1 1  '$ Language- follow 3 step command '3 3 3  '$ Language- read & follow direction '1 1 1  '$ Write a sentence '1 1 1  '$ Copy design '1 1 1  '$ Total score 22 18 22  Immunizations Immunization History  Administered Date(s) Administered   Influenza Split 03/05/2012   PNEUMOCOCCAL CONJUGATE-20 07/22/2022    TDAP status: Due, Education has been provided regarding the importance of this vaccine. Advised may receive this vaccine at local pharmacy or Health Dept. Aware to provide a copy of the vaccination record if obtained from local pharmacy or Health Dept. Verbalized acceptance and understanding.  Flu Vaccine status: Declined, Education has been provided regarding the importance of this vaccine but patient still declined. Advised may receive this vaccine at local pharmacy or Health Dept. Aware to provide a  copy of the vaccination record if obtained from local pharmacy or Health Dept. Verbalized acceptance and understanding.  Pneumococcal vaccine status: Completed during today's visit.  Covid-19 vaccine status: Information provided on how to obtain vaccines.   Qualifies for Shingles Vaccine? Yes   Zostavax completed No   Shingrix Completed?: No.    Education has been provided regarding the importance of this vaccine. Patient has been advised to call insurance company to determine out of pocket expense if they have not yet received this vaccine. Advised may also receive vaccine at local pharmacy or Health Dept. Verbalized acceptance and understanding.  Screening Tests Health Maintenance  Topic Date Due   COVID-19 Vaccine (1) Never done   Hepatitis C Screening  Never done   DTaP/Tdap/Td (1 - Tdap) Never done   Lung Cancer Screening  Never done   Zoster Vaccines- Shingrix (1 of 2) Never done   DEXA SCAN  Never done   INFLUENZA VACCINE  08/08/2022 (Originally 12/08/2021)   COLONOSCOPY (Pts 45-43yr Insurance coverage will need to be confirmed)  03/13/2023   Medicare Annual Wellness (AWV)  07/22/2023   MAMMOGRAM  03/09/2024   Pneumonia Vaccine 72 Years old  Completed   HPV VACCINES  Aged Out    Health Maintenance  Health Maintenance Due  Topic Date Due   COVID-19 Vaccine (1) Never done   Hepatitis C Screening  Never done   DTaP/Tdap/Td (1 - Tdap) Never done   Lung Cancer Screening  Never done   Zoster Vaccines- Shingrix (1 of 2) Never done   DEXA SCAN  Never done    Colorectal cancer screening: Type of screening: Colonoscopy. Completed 03/2020. Repeat every 3 years  Mammogram status: Completed 02/2022. Repeat every year  Bone Density status: Ordered 07/22/2022. Pt provided with contact info and advised to call to schedule appt.  Lung Cancer Screening: (Low Dose CT Chest recommended if Age 73-80years, 30 pack-year currently smoking OR have quit w/in 15years.) does not qualify.    Lung Cancer Screening Referral: NA  Additional Screening:  Hepatitis C Screening: does qualify; Completed ordered today  Vision Screening: Recommended annual ophthalmology exams for early detection of glaucoma and other disorders of the eye. Is the patient up to date with their annual eye exam?  No  Who is the provider or what is the name of the office in which the patient attends annual eye exams? Dr HHerbert DeanerIf pt is not established with a provider, would they like to be referred to a provider to establish care?  NA .   Dental Screening: Recommended annual dental exams for proper oral hygiene  Community Resource Referral / Chronic Care Management: CRR required this visit?  No   CCM required this visit?  No      Plan:    1. Encounter for Medicare annual wellness exam   2. Advance directive discussed with patient Discussed advanced directive with her and  her daughter.  I went over what is a living will and healthcare power of attorney.  Given packet.  Advised that if they execute a living will or healthcare power of attorney, please bring a copy for the medical records.  3. Essential hypertension Not at goal.  We increase amlodipine to 10 mg daily.  Prescription sent to [facility] to get her home blood pressure monitoring device.  Once she gets it, advised to check blood pressure at least once a week with goal being 130/80 or lower - For home use only DME Other see comment - amLODipine (NORVASC) 10 MG tablet; Take 1 tablet (10 mg total) by mouth daily.  Dispense: 90 tablet; Refill: 1  4. Need for vaccination against Streptococcus pneumoniae She is due for several vaccines including shingles, pneumonia, Tdap and flu.  She does not want the flu vaccine due to previous history of GBS.  I did consult with one of our infectious disease specialist Dr. Drucilla Schmidt to inquire whether it would be safe to give pneumonia, tetanus and shingles vaccine with her history of GBS and was told that it  should be safe to do so.   We agreed to give pneumonia 20 today.  I will have her follow-up with the nurse in 1 to 2 weeks to get the tetanus vaccine.  I discussed with them about the Shingrix vaccine as there is a possibility of GBS with the shingles vaccine.  Daughter states she will think about it and will let me know on subsequent visit. - Pneumococcal conjugate vaccine 20-valent  5. Postmenopausal estrogen deficiency - DG Bone Density; Future  6. Class 2 severe obesity with serious comorbidity and body mass index (BMI) of 37.0 to 37.9 in adult, unspecified obesity type [facility]) Patient advised to eliminate sugary drinks from the diet, cut back on portion sizes especially of white carbohydrates, eat more white lean meat like chicken [place] and seafood instead of beef or pork and incorporate fresh fruits and vegetables into the diet daily. - Amb ref to Medical Nutrition Therapy-MNT  7. Need for hepatitis C screening test - Hepatitis C Antibody  I have personally reviewed and noted the following in the patient's chart:   Medical and social history Use of alcohol, tobacco or illicit drugs  Current medications and supplements including opioid prescriptions. Patient is not currently taking opioid prescriptions. Functional ability and status Nutritional status Physical activity Advanced directives List of other physicians Hospitalizations, surgeries, and ER visits in previous 12 months Vitals Screenings to include cognitive, depression, and falls Referrals and appointments  In addition, I have reviewed and discussed with patient certain preventive protocols, quality metrics, and best practice recommendations. A written personalized care plan for preventive services as well as general preventive health recommendations were provided to patient.     Karle Plumber, MD   07/22/2022

## 2022-07-23 ENCOUNTER — Ambulatory Visit: Payer: Medicare HMO | Attending: Internal Medicine

## 2022-07-24 LAB — HEPATITIS C ANTIBODY: Hep C Virus Ab: NONREACTIVE

## 2022-08-05 ENCOUNTER — Ambulatory Visit: Payer: Medicare HMO | Attending: Family Medicine

## 2022-08-05 DIAGNOSIS — Z23 Encounter for immunization: Secondary | ICD-10-CM

## 2022-08-05 NOTE — Progress Notes (Signed)
TDAP vaccine administered in left deltoid per protocols.  Information sheet given. Patient denies and pain or discomfort at injection site. Tolerated injection well no reaction.

## 2022-11-14 ENCOUNTER — Other Ambulatory Visit: Payer: Self-pay | Admitting: Internal Medicine

## 2022-11-14 DIAGNOSIS — Z8673 Personal history of transient ischemic attack (TIA), and cerebral infarction without residual deficits: Secondary | ICD-10-CM

## 2022-11-22 ENCOUNTER — Encounter: Payer: Self-pay | Admitting: Internal Medicine

## 2022-11-22 ENCOUNTER — Ambulatory Visit: Payer: Medicare HMO | Attending: Internal Medicine | Admitting: Internal Medicine

## 2022-11-22 VITALS — BP 128/84 | HR 71 | Temp 98.1°F | Ht 61.0 in | Wt 200.0 lb

## 2022-11-22 DIAGNOSIS — Z86018 Personal history of other benign neoplasm: Secondary | ICD-10-CM

## 2022-11-22 DIAGNOSIS — G300 Alzheimer's disease with early onset: Secondary | ICD-10-CM | POA: Diagnosis not present

## 2022-11-22 DIAGNOSIS — I10 Essential (primary) hypertension: Secondary | ICD-10-CM | POA: Diagnosis not present

## 2022-11-22 DIAGNOSIS — Z8639 Personal history of other endocrine, nutritional and metabolic disease: Secondary | ICD-10-CM

## 2022-11-22 DIAGNOSIS — F02A0 Dementia in other diseases classified elsewhere, mild, without behavioral disturbance, psychotic disturbance, mood disturbance, and anxiety: Secondary | ICD-10-CM

## 2022-11-22 NOTE — Progress Notes (Signed)
Patient ID: Abigail Powell, female    DOB: 10-05-1950  MRN: [id number]  CC: Hypertension (HTN f/u. /No questions / concerns)   Subjective: Abigail Powell is a 72 y.o. female who presents for chronic ds management.  Daughter, Abigail Powell, is with her Her concerns today include:  Patient with history of HTN, TIA 2019, EtOH use disorder in sustain remission, HL, GBS, dementia, hyperthyroidism, left eye tumor status post resection meningioma, Vit D def   My 1st year medical student, Abigail Powell, is present.  HTN: Compliant with Norvasc 10 mg daily and took already for the morning.  No device to check blood pressure but would like to get 1..  Dementia: Both daughter and patient reports no major changes in memory decline since last visit.  Daughter tries to keep her sticking to a daily routine.  No behavioral issues.  She is eating and sleeping well. She has vitamin D deficiency.  She is taking vitamin D supplements over-the-counter.  Hx of LT eye tumor resection.  Has f/u with her ophthalmologist in Sept.  Denies blurred or double vision.  HM: They have decided against getting the shingles vaccine.  Lung cancer screening is on her health maintenance.  However patient states she quit smoking 20 to 30 years ago.  Patient Active Problem List   Diagnosis Date Noted   Mild early onset Alzheimer's dementia without behavioral disturbance, psychotic disturbance, mood disturbance, or anxiety (HCC) 06/18/2022   TIA (transient ischemic attack) 01/30/2018   Hx of Guillain-Barre syndrome 09/06/2012   Memory loss 09/06/2012   Quadriparesis (HCC) 03/07/2012   Severe alcohol use disorder, in sustained remission (HCC) 03/06/2012   Subclinical hyperthyroidism 03/03/2012   Hypokalemia 03/03/2012   Transaminitis 03/03/2012   Encephalopathy acute 03/01/2012   Weakness 03/01/2012   Hypertension 03/01/2012   Hyperlipidemia 03/01/2012     Current Outpatient Medications on File Prior to Visit  Medication Sig  Dispense Refill   amLODipine (NORVASC) 10 MG tablet Take 1 tablet (10 mg total) by mouth daily. 90 tablet 1   atorvastatin (LIPITOR) 20 MG tablet TAKE 1 TABLET BY MOUTH DAILY AT  6 PM. 90 tablet 3   Calcium Carbonate-Vitamin D (CALCIUM + D PO) Take 600 mg by mouth 2 (two) times daily.     Cholecalciferol (VITAMIN D PO) Take 1 tablet by mouth daily.     PEG-KCl-NaCl-NaSulf-Na Asc-C (PLENVU) 140 g SOLR Take 1 kit by mouth as directed. Manufacturer's coupon Universal coupon code:BIN: [id number]; GROUP: [id number]; PCN: [id number]; ID: [id number]; PAY NO MORE $50 1 each 0   vitamin B-12 (CYANOCOBALAMIN) 1000 MCG tablet Take 1,000 mcg by mouth daily.     VITAMIN E PO Take 1 capsule by mouth daily.     No current facility-administered medications on file prior to visit.    Allergies  Allergen Reactions   Phenergan [Promethazine Hcl] Shortness Of Breath   Asa [Aspirin] Other (See Comments)    Causes ulcer   Zofran [Ondansetron Hcl] Other (See Comments)    Unknown reaction per pt    Social History   Socioeconomic History   Marital status: Legally Separated    Spouse name: Not on file   Number of children: Not on file   Years of education: Not on file   Highest education level: Not on file  Occupational History   Not on file  Tobacco Use   Smoking status: Former   Smokeless tobacco: Never  Vaping Use   Vaping status: Never Used  Substance and  Sexual Activity   Alcohol use: Yes    Comment: pt's daughter reports pt drinks beer and scotch every day keeping a thermos filled all day; unsure of how much     Drug use: No   Sexual activity: Not on file  Other Topics Concern   Not on file  Social History Narrative   Not on file   Social Determinants of Health   Financial Resource Strain: Low Risk  (07/22/2022)   Overall Financial Resource Strain (CARDIA)    Difficulty of Paying Living Expenses: Not hard at all  Food Insecurity: No Food Insecurity (07/22/2022)   Hunger Vital Sign    Worried  About Running Out of Food in the Last Year: Never true    Ran Out of Food in the Last Year: Never true  Transportation Needs: No Transportation Needs (07/22/2022)   PRAPARE - Administrator, Civil Service (Medical): No    Lack of Transportation (Non-Medical): No  Physical Activity: Insufficiently Active (07/22/2022)   Exercise Vital Sign    Days of Exercise per Week: 4 days    Minutes of Exercise per Session: 10 min  Stress: No Stress Concern Present (07/22/2022)   [employer] of Occupational Health - Occupational Stress Questionnaire    Feeling of Stress : Not at all  Social Connections: Moderately Integrated (07/22/2022)   Social Connection and Isolation Panel [NHANES]    Frequency of Communication with Friends and Family: Three times a week    Frequency of Social Gatherings with Friends and Family: Three times a week    Attends Religious Services: More than 4 times per year    Active Member of Clubs or Organizations: Yes    Attends Banker Meetings: More than 4 times per year    Marital Status: Widowed  Intimate Partner Violence: Unknown (01/12/2022)   Received from [facility]   HITS    Physically Hurt: Not on file    Insult or Talk Down To: Not on file    Threaten Physical Harm: Not on file    Scream or Curse: Not on file    Family History  Problem Relation Age of Onset   Ovarian cancer Mother    Prostate cancer Father    Alcoholism Father    Hypertension Other    Colon polyps Neg Hx    Colon cancer Neg Hx    Esophageal cancer Neg Hx    Stomach cancer Neg Hx    Rectal cancer Neg Hx     Past Surgical History:  Procedure Laterality Date   ABDOMINAL HYSTERECTOMY     CHOLECYSTECTOMY  02/26/2012   Procedure: LAPAROSCOPIC CHOLECYSTECTOMY WITH INTRAOPERATIVE CHOLANGIOGRAM;  Surgeon: Lodema Pilot, DO;  Location: [facility];  Service: General;  Laterality: N/A;   Orthoscopic knee surgery     TUMOR REMOVAL Left 08/07/12   behind left eye   WISDOM  TOOTH EXTRACTION      ROS: Review of Systems Negative except as stated above  PHYSICAL EXAM: BP 128/84 (BP Location: Left Arm, Patient Position: Sitting, Cuff Size: Large)   Pulse 71   Temp 98.1 F (36.7 C) (Oral)   Ht 5\' 1"  (1.549 m)   Wt 200 lb (90.7 kg)   SpO2 97%   BMI 37.79 kg/m   Physical Exam  General appearance - alert, well appearing, older African-American female and in no distress.  She appears well-nourished.  Clothing clean. Mental status -patient was unable to correctly tell me the day of the  week, month or year. Neck - supple, no significant adenopathy LT eye mildly protruding compared to RT Chest - clear to auscultation, no wheezes, rales or rhonchi, symmetric air entry Heart - normal rate, regular rhythm, normal S1, S2, no murmurs, rubs, clicks or gallops Neurological -grip 5/5 bilaterally.  Power in the upper and lower extremities 5/5 bilaterally. Extremities -no lower extremity edema.      Latest Ref Rng & Units 06/18/2022   10:15 AM 01/30/2018    2:26 AM 01/29/2018    7:06 PM  CMP  Glucose 70 - 99 mg/dL 82   161   BUN 8 - 27 mg/dL 25   18   Creatinine 0.96 - 1.00 mg/dL 0.45  4.09  8.11   Sodium 134 - 144 mmol/L 143   143   Potassium 3.5 - 5.2 mmol/L 5.3   4.4   Chloride 96 - 106 mmol/L 105   109   CO2 20 - 29 mmol/L 22   25   Calcium 8.7 - 10.3 mg/dL 9.7   9.3   Total Protein 6.0 - 8.5 g/dL 7.0   6.7   Total Bilirubin 0.0 - 1.2 mg/dL 0.4   0.5   Alkaline Phos 44 - 121 IU/L 88   70   AST 0 - 40 IU/L 18   14   ALT 0 - 32 IU/L 12   9    Lipid Panel     Component Value Date/Time   CHOL 164 06/18/2022 1015   TRIG 94 06/18/2022 1015   HDL 51 06/18/2022 1015   CHOLHDL 3.2 06/18/2022 1015   CHOLHDL 3.6 01/30/2018 0220   VLDL 11 01/30/2018 0220   LDLCALC 96 06/18/2022 1015    CBC    Component Value Date/Time   WBC 5.3 06/18/2022 1015   WBC 6.7 01/30/2018 0226   RBC 4.62 06/18/2022 1015   RBC 4.22 01/30/2018 0226   HGB 14.6 06/18/2022 1015    HCT 43.5 06/18/2022 1015   PLT 283 06/18/2022 1015   MCV 94 06/18/2022 1015   MCH 31.6 06/18/2022 1015   MCH 31.3 01/30/2018 0226   MCHC 33.6 06/18/2022 1015   MCHC 32.2 01/30/2018 0226   RDW 11.9 06/18/2022 1015   LYMPHSABS 2.0 01/29/2018 1906   MONOABS 0.7 01/29/2018 1906   EOSABS 0.1 01/29/2018 1906   BASOSABS 0.0 01/29/2018 1906    ASSESSMENT AND PLAN: 1. Essential hypertension Close to goal Continue Norvasc 10 mg daily and low salt diet Rxn given for home BP monitor.  Went over with daughter how to check BP and goal of 130/80 or lower - For home use only DME Other see comment  2. Mild early onset Alzheimer's dementia without behavioral disturbance, psychotic disturbance, mood disturbance, or anxiety (HCC) Stable.  Continue having her stick to a daily routine.  Good support from her daughter who is her caregiver.  3. History of vitamin D deficiency Continue vitamin D supplement.  4. History of benign eye tumor Keep upcoming appointment with her eye doctor.  If she develops any blurred or double vision, she should be seen sooner.     Patient was given the opportunity to ask questions.  Patient verbalized understanding of the plan and was able to repeat key elements of the plan.   This documentation was completed using Paediatric nurse.  Any transcriptional errors are unintentional.  No orders of the defined types were placed in this encounter.    Requested Prescriptions    No  prescriptions requested or ordered in this encounter    No follow-ups on file.  Jonah Blue, MD, FACP

## 2022-12-26 ENCOUNTER — Other Ambulatory Visit: Payer: Self-pay | Admitting: Internal Medicine

## 2022-12-26 DIAGNOSIS — I10 Essential (primary) hypertension: Secondary | ICD-10-CM

## 2022-12-28 NOTE — Telephone Encounter (Signed)
Requested Prescriptions  Pending Prescriptions Disp Refills   amLODipine (NORVASC) 10 MG tablet [Pharmacy Med Name: amLODIPine Besylate 10 MG Oral Tablet] 90 tablet 0    Sig: TAKE 1 TABLET BY MOUTH DAILY     Cardiovascular: Calcium Channel Blockers 2 Passed - 12/26/2022  5:11 AM      Passed - Last BP in normal range    BP Readings from Last 1 Encounters:  11/22/22 128/84         Passed - Last Heart Rate in normal range    Pulse Readings from Last 1 Encounters:  11/22/22 71         Passed - Valid encounter within last 6 months    Recent Outpatient Visits           1 month ago Essential hypertension   [facility] Marcine Matar, MD   5 months ago Encounter for [employer] annual wellness exam   [facility] Marcine Matar, MD   6 months ago Establishing care with new doctor, encounter for   [facility] Marcine Matar, MD       Future Appointments             In 2 months Marcine Matar, MD [facility]

## 2023-01-06 ENCOUNTER — Ambulatory Visit: Payer: Medicare HMO | Attending: Physician Assistant | Admitting: Physician Assistant

## 2023-01-06 ENCOUNTER — Encounter: Payer: Self-pay | Admitting: Physician Assistant

## 2023-01-06 VITALS — BP 161/100 | HR 67 | Wt 201.4 lb

## 2023-01-06 DIAGNOSIS — R350 Frequency of micturition: Secondary | ICD-10-CM | POA: Diagnosis not present

## 2023-01-06 DIAGNOSIS — M79621 Pain in right upper arm: Secondary | ICD-10-CM

## 2023-01-06 DIAGNOSIS — Z1239 Encounter for other screening for malignant neoplasm of breast: Secondary | ICD-10-CM | POA: Diagnosis not present

## 2023-01-06 DIAGNOSIS — I10 Essential (primary) hypertension: Secondary | ICD-10-CM | POA: Diagnosis not present

## 2023-01-06 LAB — POCT URINALYSIS DIP (CLINITEK)
Bilirubin, UA: NEGATIVE
Blood, UA: NEGATIVE
Glucose, UA: NEGATIVE mg/dL
Ketones, POC UA: NEGATIVE mg/dL
Leukocytes, UA: NEGATIVE
Nitrite, UA: NEGATIVE
POC PROTEIN,UA: NEGATIVE
Spec Grav, UA: 1.025 (ref 1.010–1.025)
Urobilinogen, UA: 0.2 E.U./dL
pH, UA: 5.5 (ref 5.0–8.0)

## 2023-01-06 NOTE — Patient Instructions (Signed)
Check blood pressures out of office and record and bring to next visit

## 2023-01-06 NOTE — Progress Notes (Signed)
Patient ID: Abigail Powell, female   DOB: 1951/04/21, 72 y.o.   MRN: [id number]   Abigail Powell, is a 72 y.o. female  YQM:[id number]  [id number]  DOB - 08/30/1950  Chief Complaint  Patient presents with   pain in armpit    Only the right        Subjective:   Abigail Powell is a 72 y.o. female here today with her daughter for a few concerns.  Needs handicap placard renewed for vehicle and brought in form.    About 2 weeks ago she was having some tenderness in her R axilla.  It lasted for several days.  No fever.  No lump. No drainage.  Last mammo 05/2021 and normal.  The pain has since subsided.  Appetite is good.  No weight loss or constitutional s/sx  Daughter has noticed the patient urinating frequently but the patient states everything seems normal to her and she denies dysuria or overflow.    No problems updated.  ALLERGIES: Allergies  Allergen Reactions   Phenergan [Promethazine Hcl] Shortness Of Breath   Asa [Aspirin] Other (See Comments)    Causes ulcer   Zofran [Ondansetron Hcl] Other (See Comments)    Unknown reaction per pt    PAST MEDICAL HISTORY: Past Medical History:  Diagnosis Date   Adult failure to thrive    Alcohol abuse, unspecified    hx of   Allergy    seasonal allergies   Benign neoplasm of eye, part unspecified    Benign tumor of eye    Behind the left eye, unclear if benign   Blood transfusion without reported diagnosis    hx of   Diverticulosis    Essential hypertension, benign    GERD (gastroesophageal reflux disease)    on meds   H/O gastric ulcer    Hx of Guillain-Barre syndrome 09/06/2012   Hypertension    on meds   Hypopotassemia    Insomnia, unspecified    Lack of coordination    Memory loss 09/06/2012   Muscle weakness (generalized)    Other and unspecified hyperlipidemia    on meds   Other quadriplegia and quadriparesis    Pneumonia, organism unspecified(486)    Pneumonia, organism unspecified(486) 2013   History of pneumonia    Polyneuropathy in diabetes(357.2)    History of    Symbolic dysfunction, unspecified    Thyrotoxicosis without mention of goiter or other cause, without mention of thyrotoxic crisis or storm    hx of   TIA (transient ischemic attack) 01/2018    MEDICATIONS AT HOME: Prior to Admission medications   Medication Sig Start Date End Date Taking? Authorizing Provider  amLODipine (NORVASC) 10 MG tablet TAKE 1 TABLET BY MOUTH DAILY 12/28/22  Yes Marcine Matar, MD  atorvastatin (LIPITOR) 20 MG tablet TAKE 1 TABLET BY MOUTH DAILY AT  6 PM. 11/15/22  Yes Marcine Matar, MD  Calcium Carbonate-Vitamin D (CALCIUM + D PO) Take 600 mg by mouth 2 (two) times daily.   Yes [provider]  Cholecalciferol (VITAMIN D PO) Take 1 tablet by mouth daily.   Yes [provider]  PEG-KCl-NaCl-NaSulf-Na Asc-C (PLENVU) 140 g SOLR Take 1 kit by mouth as directed. Manufacturer's coupon Universal coupon code:BIN: [id number]; GROUP: [id number]; PCN: [id number]; ID: [id number]; PAY NO MORE $50 03/03/20  Yes Napoleon Form, MD  vitamin B-12 (CYANOCOBALAMIN) 1000 MCG tablet Take 1,000 mcg by mouth daily.   Yes [provider]  VITAMIN E PO Take 1  capsule by mouth daily.   Yes [provider]    ROS: Neg HEENT Neg resp Neg cardiac Neg GI Neg GU Neg MS Neg psych Neg neuro  Objective:   Vitals:   01/06/23 1557 01/06/23 1620  BP: (!) 169/87 (!) 161/100  Pulse: 67   SpO2: 96%   Weight: 201 lb 6.4 oz (91.4 kg)    Exam General appearance : Awake, alert, not in any distress. Speech Clear. Not toxic looking HEENT: Atraumatic and Normocephalic Neck: Supple, no JVD. No cervical lymphadenopathy.  Chest: Good air entry bilaterally, CTAB.  No rales/rhonchi/wheezing CVS: S1 S2 regular, no murmurs.  B axilla normal without mass, lump, redness or concern Extremities: B/L Lower Ext shows no edema, both legs are warm to touch Neurology: Awake alert, and oriented X 3, CN II-XII intact,  Non focal Skin: No Rash  Data Review Lab Results  Component Value Date   HGBA1C 5.8 (H) 01/30/2018    Assessment & Plan   1. Axillary pain, right No concerning area and Mammo due in 5 weeks.  Mammo WNL 02/2022  2. Urinary frequency UA dip WNL.  Patient asymptomatic - POCT URINALYSIS DIP (CLINITEK)  3. Encounter for screening for malignant neoplasm of breast, unspecified screening modality No axillary abnormality - MM 3D SCREENING MAMMOGRAM BILATERAL BREAST; Future  4. Htn-uncontrolled but it does not sound as though she took her meds the last couple days. Check blood pressures out of office and record and bring to next visit  Return in about 2 months (around 03/08/2023) for PCP for chronic conditions-[place].  The patient was given clear instructions to go to ER or return to medical center if symptoms don't improve, worsen or new problems develop. The patient verbalized understanding. The patient was told to call to get lab results if they haven't heard anything in the next week.      Abigail Co, PA-C [facility] [place], [place] [phone]   01/06/2023, 4:25 PM

## 2023-01-24 ENCOUNTER — Ambulatory Visit
Admission: RE | Admit: 2023-01-24 | Discharge: 2023-01-24 | Disposition: A | Discharge status: Home / Self Care | Payer: Medicare HMO | Source: Ambulatory Visit | Attending: Internal Medicine | Admitting: Internal Medicine

## 2023-01-24 DIAGNOSIS — Z780 Asymptomatic menopausal state: Secondary | ICD-10-CM

## 2023-02-02 ENCOUNTER — Telehealth: Payer: Self-pay | Admitting: Internal Medicine

## 2023-02-02 NOTE — Telephone Encounter (Signed)
Pt. Given bone density results and instructions. Verbalizes understanding.

## 2023-03-11 ENCOUNTER — Ambulatory Visit: Payer: Medicare HMO

## 2023-03-25 ENCOUNTER — Ambulatory Visit: Payer: Medicare HMO | Admitting: Internal Medicine

## 2023-03-29 ENCOUNTER — Other Ambulatory Visit: Payer: Self-pay | Admitting: Internal Medicine

## 2023-03-29 DIAGNOSIS — I10 Essential (primary) hypertension: Secondary | ICD-10-CM

## 2023-04-14 ENCOUNTER — Ambulatory Visit: Payer: Medicare HMO

## 2023-04-14 ENCOUNTER — Ambulatory Visit
Admission: RE | Admit: 2023-04-14 | Discharge: 2023-04-14 | Disposition: A | Discharge status: Home / Self Care | Payer: Medicare HMO | Source: Ambulatory Visit | Attending: Physician Assistant | Admitting: Physician Assistant

## 2023-04-14 DIAGNOSIS — Z1239 Encounter for other screening for malignant neoplasm of breast: Secondary | ICD-10-CM

## 2023-04-20 ENCOUNTER — Telehealth: Payer: Self-pay

## 2023-04-20 NOTE — Telephone Encounter (Signed)
-----   Message from Georgian Co sent at 04/19/2023 12:04 PM EST ----- Please call patient. Mammogram was normal. Next mammogram in 1 year. Thanks,  Georgian Co, PA-C

## 2023-04-20 NOTE — Telephone Encounter (Signed)
Pt was called and vm was left, Information has been sent to nurse pool.

## 2023-06-02 ENCOUNTER — Ambulatory Visit: Payer: Medicare HMO | Admitting: Internal Medicine

## 2023-07-18 ENCOUNTER — Ambulatory Visit: Payer: Medicare HMO | Admitting: Internal Medicine

## 2023-07-25 ENCOUNTER — Encounter: Payer: Self-pay | Admitting: Internal Medicine

## 2023-07-25 ENCOUNTER — Ambulatory Visit: Attending: Internal Medicine | Admitting: Internal Medicine

## 2023-07-25 VITALS — BP 127/78 | HR 79 | Temp 97.7°F | Ht 61.0 in | Wt 206.0 lb

## 2023-07-25 DIAGNOSIS — I10 Essential (primary) hypertension: Secondary | ICD-10-CM

## 2023-07-25 DIAGNOSIS — D126 Benign neoplasm of colon, unspecified: Secondary | ICD-10-CM

## 2023-07-25 DIAGNOSIS — F1021 Alcohol dependence, in remission: Secondary | ICD-10-CM

## 2023-07-25 DIAGNOSIS — G300 Alzheimer's disease with early onset: Secondary | ICD-10-CM

## 2023-07-25 DIAGNOSIS — E559 Vitamin D deficiency, unspecified: Secondary | ICD-10-CM

## 2023-07-25 DIAGNOSIS — E66812 Obesity, class 2: Secondary | ICD-10-CM

## 2023-07-25 DIAGNOSIS — Z1211 Encounter for screening for malignant neoplasm of colon: Secondary | ICD-10-CM

## 2023-07-25 DIAGNOSIS — Z6838 Body mass index (BMI) 38.0-38.9, adult: Secondary | ICD-10-CM

## 2023-07-25 DIAGNOSIS — F02A0 Dementia in other diseases classified elsewhere, mild, without behavioral disturbance, psychotic disturbance, mood disturbance, and anxiety: Secondary | ICD-10-CM

## 2023-07-25 DIAGNOSIS — E785 Hyperlipidemia, unspecified: Secondary | ICD-10-CM

## 2023-07-25 DIAGNOSIS — Z8639 Personal history of other endocrine, nutritional and metabolic disease: Secondary | ICD-10-CM

## 2023-07-25 NOTE — Progress Notes (Signed)
 Patient ID: [name], female    DOB: 1950-11-25  MRN: [id number]  CC: Hypertension (HTN f/u. /No questions / concerns/No to all vax. Yes to colonoscopy)   Subjective: Abigail Powell is a 73 y.o. female who presents for chronic ds management. Daughter, Abigail Powell, is with her  Her concerns today include:  Patient with history of HTN, TIA 2019, EtOH use disorder in sustain remission, HL, GBS, dementia, hyperthyroidism, left eye tumor status post resection meningioma, Vit D def   HTN: Reports compliance with amlodipine 10 mg daily.  No chest pains or shortness of breath, HA or dizziness.  No lower extremity edema. Taking and tolerating the Lipitor 20 mg daily  Dementia: She has not had any behavioral issues; she does not drive.   -She is eating and sleeping well.  Overall daughter feels she is stable. During the day she watches TV and works on word games.  If her daughter is going out she would take her with her sometimes. Weight is up 6 pounds since I saw her in July of last year.  BMI is 38. She attributes gain to not being as active during the winter months.  Usually walks daily but not during the winter.  She plans to start again now that spring is here.  EtOH use disorder: She has remained free of alcohol for 12 many years.  Hx of vit d def: taking supplement daily  Has history of hyperthyroidism about 10 to 12 years ago.  States that she was on medication but was eventually able to be taken off.  No palpitations  HM: Last colonoscopy was 3 years ago.  Several polyps removed.  She is due for repeat colonoscopy.  Patient Active Problem List   Diagnosis Date Noted   Mild early onset Alzheimer's dementia without behavioral disturbance, psychotic disturbance, mood disturbance, or anxiety (HCC) 06/18/2022   TIA (transient ischemic attack) 01/30/2018   Hx of Guillain-Barre syndrome 09/06/2012   Memory loss 09/06/2012   Severe alcohol use disorder, in sustained remission (HCC) 03/06/2012    Subclinical hyperthyroidism 03/03/2012   Hypokalemia 03/03/2012   Transaminitis 03/03/2012   Encephalopathy acute 03/01/2012   Weakness 03/01/2012   Hypertension 03/01/2012   Hyperlipidemia 03/01/2012     Current Outpatient Medications on File Prior to Visit  Medication Sig Dispense Refill   amLODipine (NORVASC) 10 MG tablet TAKE 1 TABLET BY MOUTH DAILY 90 tablet 3   atorvastatin (LIPITOR) 20 MG tablet TAKE 1 TABLET BY MOUTH DAILY AT  6 PM. 90 tablet 3   Calcium Carbonate-Vitamin D (CALCIUM + D PO) Take 600 mg by mouth 2 (two) times daily.     Cholecalciferol (VITAMIN D PO) Take 1 tablet by mouth daily.     vitamin B-12 (CYANOCOBALAMIN) 1000 MCG tablet Take 1,000 mcg by mouth daily.     VITAMIN E PO Take 1 capsule by mouth daily.     No current facility-administered medications on file prior to visit.    Allergies  Allergen Reactions   Phenergan [Promethazine Hcl] Shortness Of Breath   Asa [Aspirin] Other (See Comments)    Causes ulcer   Zofran [Ondansetron Hcl] Other (See Comments)    Unknown reaction per pt    Social History   Socioeconomic History   Marital status: Legally Separated    Spouse name: Not on file   Number of children: Not on file   Years of education: Not on file   Highest education level: Not on file  Occupational  History   Not on file  Tobacco Use   Smoking status: Former   Smokeless tobacco: Never  Vaping Use   Vaping status: Never Used  Substance and Sexual Activity   Alcohol use: Yes    Comment: pt's daughter reports pt drinks beer and scotch every day keeping a thermos filled all day; unsure of how much     Drug use: No   Sexual activity: Not on file  Other Topics Concern   Not on file  Social History Narrative   Not on file   Social Drivers of Health   Financial Resource Strain: Low Risk  (07/25/2023)   Overall Financial Resource Strain (CARDIA)    Difficulty of Paying Living Expenses: Not hard at all  Food Insecurity: No Food  Insecurity (07/25/2023)   Hunger Vital Sign    Worried About Running Out of Food in the Last Year: Never true    Ran Out of Food in the Last Year: Never true  Transportation Needs: No Transportation Needs (07/25/2023)   PRAPARE - Administrator, Civil Service (Medical): No    Lack of Transportation (Non-Medical): No  Physical Activity: Insufficiently Active (07/25/2023)   Exercise Vital Sign    Days of Exercise per Week: 5 days    Minutes of Exercise per Session: 20 min  Stress: Stress Concern Present (07/25/2023)   [employer] of Occupational Health - Occupational Stress Questionnaire    Feeling of Stress : To some extent  Social Connections: Moderately Isolated (07/25/2023)   Social Connection and Isolation Panel [NHANES]    Frequency of Communication with Friends and Family: Twice a week    Frequency of Social Gatherings with Friends and Family: Three times a week    Attends Religious Services: More than 4 times per year    Active Member of Clubs or Organizations: No    Attends Banker Meetings: Never    Marital Status: Widowed  Intimate Partner Violence: Not At Risk (07/25/2023)   Humiliation, Afraid, Rape, and Kick questionnaire    Fear of Current or Ex-Partner: No    Emotionally Abused: No    Physically Abused: No    Sexually Abused: No    Family History  Problem Relation Age of Onset   Ovarian cancer Mother    Prostate cancer Father    Alcoholism Father    Hypertension Other    Colon polyps Neg Hx    Colon cancer Neg Hx    Esophageal cancer Neg Hx    Stomach cancer Neg Hx    Rectal cancer Neg Hx     Past Surgical History:  Procedure Laterality Date   ABDOMINAL HYSTERECTOMY     CHOLECYSTECTOMY  02/26/2012   Procedure: LAPAROSCOPIC CHOLECYSTECTOMY WITH INTRAOPERATIVE CHOLANGIOGRAM;  Surgeon: Lodema Pilot, DO;  Location: [facility];  Service: General;  Laterality: N/A;   Orthoscopic knee surgery     TUMOR REMOVAL Left 08/07/12   behind left  eye   WISDOM TOOTH EXTRACTION      ROS: Review of Systems Negative except as stated above  PHYSICAL EXAM: BP 127/78 (BP Location: Left Arm, Patient Position: Sitting, Cuff Size: Normal)   Pulse 79   Temp 97.7 F (36.5 C) (Oral)   Ht 5\' 1"  (1.549 m)   Wt 206 lb (93.4 kg)   SpO2 94%   BMI 38.92 kg/m   Wt Readings from Last 3 Encounters:  07/25/23 206 lb (93.4 kg)  01/06/23 201 lb 6.4 oz (91.4 kg)  11/22/22 200 lb (90.7 kg)    Physical Exam  General appearance - alert, well appearing, and in no distress Mental status -patient is oriented to person and place.  She answers questions appropriately. Eyes -she has mild protrusion of the eyes which seem unchanged from previous exam. Neck - supple, no significant adenopathy Chest - clear to auscultation, no wheezes, rales or rhonchi, symmetric air entry Heart - normal rate, regular rhythm, normal S1, S2, no murmurs, rubs, clicks or gallops Extremities - peripheral pulses normal, no pedal edema, no clubbing or cyanosis MSK: She was noted ambulating down the hall to the bathroom.  She ambulates unassisted.     Latest Ref Rng & Units 06/18/2022   10:15 AM 01/30/2018    2:26 AM 01/29/2018    7:06 PM  CMP  Glucose 70 - 99 mg/dL 82   161   BUN 8 - 27 mg/dL 25   18   Creatinine 0.96 - 1.00 mg/dL 0.45  4.09  8.11   Sodium 134 - 144 mmol/L 143   143   Potassium 3.5 - 5.2 mmol/L 5.3   4.4   Chloride 96 - 106 mmol/L 105   109   CO2 20 - 29 mmol/L 22   25   Calcium 8.7 - 10.3 mg/dL 9.7   9.3   Total Protein 6.0 - 8.5 g/dL 7.0   6.7   Total Bilirubin 0.0 - 1.2 mg/dL 0.4   0.5   Alkaline Phos 44 - 121 IU/L 88   70   AST 0 - 40 IU/L 18   14   ALT 0 - 32 IU/L 12   9    Lipid Panel     Component Value Date/Time   CHOL 164 06/18/2022 1015   TRIG 94 06/18/2022 1015   HDL 51 06/18/2022 1015   CHOLHDL 3.2 06/18/2022 1015   CHOLHDL 3.6 01/30/2018 0220   VLDL 11 01/30/2018 0220   LDLCALC 96 06/18/2022 1015    CBC    Component Value  Date/Time   WBC 5.3 06/18/2022 1015   WBC 6.7 01/30/2018 0226   RBC 4.62 06/18/2022 1015   RBC 4.22 01/30/2018 0226   HGB 14.6 06/18/2022 1015   HCT 43.5 06/18/2022 1015   PLT 283 06/18/2022 1015   MCV 94 06/18/2022 1015   MCH 31.6 06/18/2022 1015   MCH 31.3 01/30/2018 0226   MCHC 33.6 06/18/2022 1015   MCHC 32.2 01/30/2018 0226   RDW 11.9 06/18/2022 1015   LYMPHSABS 2.0 01/29/2018 1906   MONOABS 0.7 01/29/2018 1906   EOSABS 0.1 01/29/2018 1906   BASOSABS 0.0 01/29/2018 1906    ASSESSMENT AND PLAN: 1. Essential hypertension (Primary) At goal.  Continue amlodipine 10 mg daily. - CBC - Comprehensive metabolic panel  2. Mild early onset Alzheimer's dementia without behavioral disturbance, psychotic disturbance, mood disturbance, or anxiety (HCC) Stable.  We will continue to observe without placing her on medications like Aricept that can have side effects.  3. Class 2 severe obesity due to excess calories with serious comorbidity and body mass index (BMI) of 38.0 to 38.9 in adult [facility]) Patient advised to eliminate sugary drinks from the diet, cut back on portion sizes especially of white carbohydrates, eat more white lean meat like chicken [place] and seafood instead of beef or pork and incorporate fresh fruits and vegetables into the diet daily. She plans to start walking again now that spring is here.  4. Severe alcohol use disorder, in sustained remission (HCC)  Commended her for sustained remission.  Encouraged her to continue to remain free of alcohol.  5. Vitamin D deficiency - VITAMIN D 25 Hydroxy (Vit-D Deficiency, Fractures)  6. Hyperlipidemia, unspecified hyperlipidemia type Continue atorvastatin. - Lipid panel  7. History of hyperthyroidism - TSH+T4F+T3Free  8. Adenomatous polyp of colon, unspecified part of colon Due for repeat c-scope - Ambulatory referral to Gastroenterology  9. Screening for colon cancer - Ambulatory referral to  Gastroenterology    Patient was given the opportunity to ask questions.  Patient verbalized understanding of the plan and was able to repeat key elements of the plan.   This documentation was completed using Paediatric nurse.  Any transcriptional errors are unintentional.  Orders Placed This Encounter  Procedures   CBC   Comprehensive metabolic panel   Lipid panel   VITAMIN D 25 Hydroxy (Vit-D Deficiency, Fractures)   TSH+T4F+T3Free   Ambulatory referral to Gastroenterology     Requested Prescriptions    No prescriptions requested or ordered in this encounter    Return in about 4 months (around 11/24/2023) for Medicare Wellness Visit in 2-4   wks with CMA or LPN.  Jonah Blue, MD, FACP

## 2023-07-26 ENCOUNTER — Encounter: Payer: Self-pay | Admitting: Internal Medicine

## 2023-07-26 LAB — LIPID PANEL
Chol/HDL Ratio: 3.5 ratio (ref 0.0–4.4)
Cholesterol, Total: 170 mg/dL (ref 100–199)
HDL: 48 mg/dL (ref >39)
LDL Chol Calc (NIH): 98 mg/dL (ref 0–99)
Triglycerides: 135 mg/dL (ref 0–149)
VLDL Cholesterol Cal: 24 mg/dL (ref 5–40)

## 2023-07-26 LAB — COMPREHENSIVE METABOLIC PANEL
ALT: 13 IU/L (ref 0–32)
AST: 17 IU/L (ref 0–40)
Albumin: 4.7 g/dL (ref 3.8–4.8)
Alkaline Phosphatase: 92 IU/L (ref 44–121)
BUN/Creatinine Ratio: 19 (ref 12–28)
BUN: 19 mg/dL (ref 8–27)
Bilirubin Total: 0.5 mg/dL (ref 0.0–1.2)
CO2: 25 mmol/L (ref 20–29)
Calcium: 10.1 mg/dL (ref 8.7–10.3)
Chloride: 104 mmol/L (ref 96–106)
Creatinine, Ser: 1.01 mg/dL — ABNORMAL HIGH (ref 0.57–1.00)
Globulin, Total: 2.8 g/dL (ref 1.5–4.5)
Glucose: 107 mg/dL — ABNORMAL HIGH (ref 70–99)
Potassium: 4.4 mmol/L (ref 3.5–5.2)
Sodium: 143 mmol/L (ref 134–144)
Total Protein: 7.5 g/dL (ref 6.0–8.5)
eGFR: 58 mL/min/{1.73_m2} — ABNORMAL LOW (ref >59)

## 2023-07-26 LAB — CBC
Hematocrit: 43.4 % (ref 34.0–46.6)
Hemoglobin: 14.3 g/dL (ref 11.1–15.9)
MCH: 30.8 pg (ref 26.6–33.0)
MCHC: 32.9 g/dL (ref 31.5–35.7)
MCV: 93 fL (ref 79–97)
Platelets: 331 10*3/uL (ref 150–450)
RBC: 4.65 x10E6/uL (ref 3.77–5.28)
RDW: 12 % (ref 11.7–15.4)
WBC: 6.4 10*3/uL (ref 3.4–10.8)

## 2023-07-26 LAB — TSH+T4F+T3FREE
Free T4: 1.01 ng/dL (ref 0.82–1.77)
T3, Free: 2.6 pg/mL (ref 2.0–4.4)
TSH: 1.49 u[IU]/mL (ref 0.450–4.500)

## 2023-07-26 LAB — VITAMIN D 25 HYDROXY (VIT D DEFICIENCY, FRACTURES): Vit D, 25-Hydroxy: 39 ng/mL (ref 30.0–100.0)

## 2023-08-01 ENCOUNTER — Ambulatory Visit: Payer: Medicare HMO | Admitting: Internal Medicine

## 2023-08-11 ENCOUNTER — Encounter: Payer: Self-pay | Admitting: Gastroenterology

## 2023-09-26 ENCOUNTER — Other Ambulatory Visit: Payer: Self-pay | Admitting: Internal Medicine

## 2023-09-26 DIAGNOSIS — Z8673 Personal history of transient ischemic attack (TIA), and cerebral infarction without residual deficits: Secondary | ICD-10-CM

## 2023-09-28 ENCOUNTER — Telehealth: Payer: Self-pay

## 2023-09-28 NOTE — Telephone Encounter (Signed)
 Called and spoke with patient's daughter Paige Boatman- [place] reviewed);  Gordan Latina reports that patient is NOT taking Plavix ; this information will be noted on the PV chart;

## 2023-10-07 ENCOUNTER — Ambulatory Visit

## 2023-10-07 VITALS — Ht 61.0 in | Wt 206.0 lb

## 2023-10-07 DIAGNOSIS — Z860100 Personal history of colon polyps, unspecified: Secondary | ICD-10-CM

## 2023-10-07 MED ORDER — PLENVU 140 G PO SOLR: 1.0000 | Freq: Once | ORAL | 0 refills | Status: AC

## 2023-10-07 NOTE — Progress Notes (Signed)
 No egg or soy allergy known to patient  No issues known to pt with past sedation with any surgeries or procedures Patient denies ever being told they had issues or difficulty with intubation  No FH of Malignant Hyperthermia Pt is not on diet pills Pt is not on  home 02  Pt is not on blood thinners  Pt denies issues with constipation  No A fib or A flutter Have any cardiac testing pending-- no  LOA: independent  Prep: suprep   Patient's chart reviewed by Rogena Class CNRA prior to previsit and patient appropriate for the [facility].  Previsit completed and red dot placed by patient's name on their procedure day (on provider's schedule).     PV completed with patients daughter. Prep instructions sent via mychart and home address.

## 2023-10-12 ENCOUNTER — Encounter: Payer: Self-pay | Admitting: Gastroenterology

## 2023-10-21 ENCOUNTER — Telehealth: Payer: Self-pay | Admitting: Gastroenterology

## 2023-10-21 ENCOUNTER — Telehealth: Payer: Self-pay

## 2023-10-21 DIAGNOSIS — Z860100 Personal history of colon polyps, unspecified: Secondary | ICD-10-CM

## 2023-10-21 MED ORDER — PLENVU 140 G PO SOLR: 1.0000 | ORAL | 0 refills | Status: DC

## 2023-10-21 NOTE — Telephone Encounter (Signed)
 Inbound call from patient to have Plenvu  sent to [facility] on [address]. He procedure is 6/16

## 2023-10-21 NOTE — Telephone Encounter (Signed)
 Plenvu  sent to pharmacy per patient request.

## 2023-10-24 ENCOUNTER — Encounter: Payer: Self-pay | Admitting: Gastroenterology

## 2023-10-24 ENCOUNTER — Ambulatory Visit (AMBULATORY_SURGERY_CENTER): Admitting: Gastroenterology

## 2023-10-24 VITALS — BP 134/74 | HR 69 | Temp 97.5°F | Resp 18 | Ht 61.0 in | Wt 206.0 lb

## 2023-10-24 DIAGNOSIS — D122 Benign neoplasm of ascending colon: Secondary | ICD-10-CM | POA: Diagnosis not present

## 2023-10-24 DIAGNOSIS — K5730 Diverticulosis of large intestine without perforation or abscess without bleeding: Secondary | ICD-10-CM | POA: Diagnosis not present

## 2023-10-24 DIAGNOSIS — K648 Other hemorrhoids: Secondary | ICD-10-CM | POA: Diagnosis not present

## 2023-10-24 DIAGNOSIS — Z860100 Personal history of colon polyps, unspecified: Secondary | ICD-10-CM

## 2023-10-24 DIAGNOSIS — Z1211 Encounter for screening for malignant neoplasm of colon: Secondary | ICD-10-CM | POA: Diagnosis present

## 2023-10-24 DIAGNOSIS — K644 Residual hemorrhoidal skin tags: Secondary | ICD-10-CM

## 2023-10-24 MED ORDER — SODIUM CHLORIDE 0.9 % IV SOLN: 500.0000 mL | Freq: Once | INTRAVENOUS | Status: DC

## 2023-10-24 NOTE — Progress Notes (Unsigned)
VS completed by DT.  Pt's states no medical or surgical changes since previsit or office visit.  

## 2023-10-24 NOTE — Progress Notes (Unsigned)
 Vss nad trans to pacu

## 2023-10-24 NOTE — Op Note (Signed)
 [facility] Patient Name: Abigail Powell Procedure Date: 10/24/2023 11:04 AM MRN: [id number] Endoscopist: Sergio Dandy , MD, [id number] Age: 73 Referring MD:  Date of Birth: 1950-11-30 Gender: Female Account #: [id number] Procedure:                Colonoscopy Indications:              High risk colon cancer surveillance: Personal                            history of adenoma (10 mm or greater in size), High                            risk colon cancer surveillance: Personal history of                            multiple (3 or more) adenomas Medicines:                Monitored Anesthesia Care Procedure:                Pre-Anesthesia Assessment:                           - Prior to the procedure, a History and Physical                            was performed, and patient medications and                            allergies were reviewed. The patient's tolerance of                            previous anesthesia was also reviewed. The risks                            and benefits of the procedure and the sedation                            options and risks were discussed with the patient.                            All questions were answered, and informed consent                            was obtained. Prior Anticoagulants: The patient has                            taken no anticoagulant or antiplatelet agents. ASA                            Grade Assessment: III - A patient with severe                            systemic disease. After reviewing the risks and  benefits, the patient was deemed in satisfactory                            condition to undergo the procedure.                           After obtaining informed consent, the colonoscope                            was passed under direct vision. Throughout the                            procedure, the patient's blood pressure, pulse, and                            oxygen saturations were  monitored continuously. The                            Olympus Scope [id number] was introduced through the                            anus and advanced to the the cecum, identified by                            appendiceal orifice and ileocecal valve. The                            colonoscopy was performed without difficulty. The                            patient tolerated the procedure well. The quality                            of the bowel preparation was good. The ileocecal                            valve, appendiceal orifice, and rectum were                            photographed. Scope In: 11:13:30 AM Scope Out: 11:30:16 AM Scope Withdrawal Time: 0 hours 13 minutes 28 seconds  Total Procedure Duration: 0 hours 16 minutes 46 seconds  Findings:                 The perianal and digital rectal examinations were                            normal.                           A 20 mm polyp was found in the ascending colon. The                            polyp was semi-pedunculated. The polyp was removed  with a hot snare. Resection and retrieval were                            complete.                           Scattered large-mouthed, medium-mouthed and                            small-mouthed diverticula were found in the sigmoid                            colon, descending colon, transverse colon and                            ascending colon.                           Non-bleeding external and internal hemorrhoids were                            found during retroflexion. The hemorrhoids were                            medium-sized. Complications:            No immediate complications. Estimated Blood Loss:     Estimated blood loss was minimal. Impression:               - One 20 mm polyp in the ascending colon, removed                            with a hot snare. Resected and retrieved.                           - Diverticulosis in the sigmoid colon, in the                             descending colon, in the transverse colon and in                            the ascending colon.                           - Non-bleeding external and internal hemorrhoids. Recommendation:           - Patient has a contact number available for                            emergencies. The signs and symptoms of potential                            delayed complications were discussed with the                            patient. Return to normal activities tomorrow.  Written discharge instructions were provided to the                            patient.                           - Resume previous diet.                           - Continue present medications.                           - Await pathology results.                           - Repeat colonoscopy in 3 years for surveillance                            based on pathology results. Branton Einstein V. Clary Meeker, MD 10/24/2023 11:35:34 AM This report has been signed electronically.

## 2023-10-24 NOTE — Progress Notes (Signed)
 Called to room to assist during endoscopic procedure.  Patient ID and intended procedure confirmed with present staff. Received instructions for my participation in the procedure from the performing physician.

## 2023-10-24 NOTE — Patient Instructions (Addendum)
-  Handout on polyps, diverticulosis and hemorrhoids provided. -await pathology results  YOU HAD AN ENDOSCOPIC PROCEDURE TODAY AT THE [facility]:   Refer to the procedure report that was given to you for any specific questions about what was found during the examination.  If the procedure report does not answer your questions, please call your gastroenterologist to clarify.  If you requested that your care partner not be given the details of your procedure findings, then the procedure report has been included in a sealed envelope for you to review at your convenience later.  YOU SHOULD EXPECT: Some feelings of bloating in the abdomen. Passage of more gas than usual.  Walking can help get rid of the air that was put into your GI tract during the procedure and reduce the bloating. If you had a lower endoscopy (such as a colonoscopy or flexible sigmoidoscopy) you may notice spotting of blood in your stool or on the toilet paper. If you underwent a bowel prep for your procedure, you may not have a normal bowel movement for a few days.  Please Note:  You might notice some irritation and congestion in your nose or some drainage.  This is from the oxygen used during your procedure.  There is no need for concern and it should clear up in a day or so.  SYMPTOMS TO REPORT IMMEDIATELY:  Following lower endoscopy (colonoscopy or flexible sigmoidoscopy):  Excessive amounts of blood in the stool  Significant tenderness or worsening of abdominal pains  Swelling of the abdomen that is new, acute  Fever of 100F or higher  For urgent or emergent issues, a gastroenterologist can be reached at any hour by calling [phone]. Do not use MyChart messaging for urgent concerns.    DIET:  We do recommend a small meal at first, but then you may proceed to your regular diet.  Drink plenty of fluids but you should avoid alcoholic beverages for 24 hours.  ACTIVITY:  You should plan to take it easy for the  rest of today and you should NOT DRIVE or use heavy machinery until tomorrow (because of the sedation medicines used during the test).    FOLLOW UP: Our staff will call the number listed on your records the next business day following your procedure.  We will call around 7:15- 8:00 am to check on you and address any questions or concerns that you may have regarding the information given to you following your procedure. If we do not reach you, we will leave a message.     If any biopsies were taken you will be contacted by phone or by letter within the next 1-3 weeks.  Please call us  at [phone] if you have not heard about the biopsies in 3 weeks.    SIGNATURES/CONFIDENTIALITY: You and/or your care partner have signed paperwork which will be entered into your electronic medical record.  These signatures attest to the fact that that the information above on your After Visit Summary has been reviewed and is understood.  Full responsibility of the confidentiality of this discharge information lies with you and/or your care-partner.

## 2023-10-24 NOTE — Progress Notes (Unsigned)
 [facility] History and Physical   Primary Care Physician:  Lawrance Presume, MD   Reason for Procedure:  History of adenomatous colon polyps  Plan:    Surveillance colonoscopy with possible interventions as needed     HPI: Abigail Powell is a very pleasant 73 y.o. female here for surveillance colonoscopy. Denies any nausea, vomiting, abdominal pain, melena or bright red blood per rectum  The risks and benefits as well as alternatives of endoscopic procedure(s) have been discussed and reviewed. All questions answered. The patient agrees to proceed.    Past Medical History:  Diagnosis Date   Adult failure to thrive    Alcohol abuse, unspecified    hx of   Allergy    seasonal allergies   Benign neoplasm of eye, part unspecified    Benign tumor of eye    Behind the left eye, unclear if benign   Blood transfusion without reported diagnosis    hx of   Diverticulosis    Essential hypertension, benign    GERD (gastroesophageal reflux disease)    on meds   H/O gastric ulcer    Hx of Guillain-Barre syndrome 09/06/2012   Hypertension    on meds   Hypopotassemia    Insomnia, unspecified    Lack of coordination    Memory loss 09/06/2012   Muscle weakness (generalized)    Other and unspecified hyperlipidemia    on meds   Other quadriplegia and quadriparesis    Pneumonia, organism unspecified(486)    Pneumonia, organism unspecified(486) 2013   History of pneumonia   Polyneuropathy in diabetes(357.2)    History of    Symbolic dysfunction, unspecified    Thyrotoxicosis without mention of goiter or other cause, without mention of thyrotoxic crisis or storm    hx of   TIA (transient ischemic attack) 01/2018    Past Surgical History:  Procedure Laterality Date   ABDOMINAL HYSTERECTOMY     CHOLECYSTECTOMY  02/26/2012   Procedure: LAPAROSCOPIC CHOLECYSTECTOMY WITH INTRAOPERATIVE CHOLANGIOGRAM;  Surgeon: Evander Hills, DO;  Location: [facility];  Service: General;   Laterality: N/A;   Orthoscopic knee surgery     TUMOR REMOVAL Left 08/07/12   behind left eye   WISDOM TOOTH EXTRACTION      Prior to Admission medications   Medication Sig Start Date End Date Taking? Authorizing Provider  amLODipine  (NORVASC ) 10 MG tablet TAKE 1 TABLET BY MOUTH DAILY 03/29/23  Yes Lawrance Presume, MD  atorvastatin  (LIPITOR) 20 MG tablet TAKE 1 TABLET BY MOUTH DAILY AT  6 PM. 09/27/23  Yes Lawrance Presume, MD  Calcium  Carbonate-Vitamin D  (CALCIUM  + D PO) Take 600 mg by mouth 2 (two) times daily.   Yes [provider]  Cholecalciferol (VITAMIN D  PO) Take 1 tablet by mouth daily.   Yes [provider]  vitamin B-12 (CYANOCOBALAMIN ) 1000 MCG tablet Take 1,000 mcg by mouth daily.   Yes [provider]  VITAMIN E PO Take 1 capsule by mouth daily.   Yes [provider]  acetaminophen  (TYLENOL ) 325 MG tablet 1 tablet as needed Orally as needed Patient not taking: Reported on 10/24/2023    [provider]    Current Outpatient Medications  Medication Sig Dispense Refill   amLODipine  (NORVASC ) 10 MG tablet TAKE 1 TABLET BY MOUTH DAILY 90 tablet 3   atorvastatin  (LIPITOR) 20 MG tablet TAKE 1 TABLET BY MOUTH DAILY AT  6 PM. 90 tablet 3   Calcium  Carbonate-Vitamin D  (CALCIUM  + D  PO) Take 600 mg by mouth 2 (two) times daily.     Cholecalciferol (VITAMIN D  PO) Take 1 tablet by mouth daily.     vitamin B-12 (CYANOCOBALAMIN ) 1000 MCG tablet Take 1,000 mcg by mouth daily.     VITAMIN E PO Take 1 capsule by mouth daily.     acetaminophen  (TYLENOL ) 325 MG tablet 1 tablet as needed Orally as needed (Patient not taking: Reported on 10/24/2023)     Current Facility-Administered Medications  Medication Dose Route Frequency Provider Last Rate Last Admin   0.9 %  sodium chloride  infusion  500 mL Intravenous Once Jailynn Lavalais V, MD        Allergies as of 10/24/2023 - Review Complete 10/24/2023  Allergen Reaction Noted   Phenergan   [promethazine  hcl] Shortness Of Breath 03/29/2012   Bessie Brook ] Other (See Comments) 01/30/2012   Memantine  Other (See Comments) 10/07/2023   Zofran  [ondansetron  hcl] Other (See Comments) 03/29/2012    Family History  Problem Relation Age of Onset   Ovarian cancer Mother    Prostate cancer Father    Alcoholism Father    Hypertension Other    Colon polyps Neg Hx    Colon cancer Neg Hx    Esophageal cancer Neg Hx    Stomach cancer Neg Hx    Rectal cancer Neg Hx     Social History   Socioeconomic History   Marital status: Legally Separated    Spouse name: Not on file   Number of children: Not on file   Years of education: Not on file   Highest education level: Not on file  Occupational History   Not on file  Tobacco Use   Smoking status: Former   Smokeless tobacco: Never  Vaping Use   Vaping status: Never Used  Substance and Sexual Activity   Alcohol use: Yes    Comment: pt's daughter reports pt drinks beer and scotch every day keeping a thermos filled all day; unsure of how much     Drug use: No   Sexual activity: Not on file  Other Topics Concern   Not on file  Social History Narrative   Not on file   Social Drivers of Health   Financial Resource Strain: Low Risk  (07/25/2023)   Overall Financial Resource Strain (CARDIA)    Difficulty of Paying Living Expenses: Not hard at all  Food Insecurity: No Food Insecurity (07/25/2023)   Hunger Vital Sign    Worried About Running Out of Food in the Last Year: Never true    Ran Out of Food in the Last Year: Never true  Transportation Needs: No Transportation Needs (07/25/2023)   PRAPARE - Administrator, Civil Service (Medical): No    Lack of Transportation (Non-Medical): No  Physical Activity: Insufficiently Active (07/25/2023)   Exercise Vital Sign    Days of Exercise per Week: 5 days    Minutes of Exercise per Session: 20 min  Stress: Stress Concern Present (07/25/2023)   [employer] of Occupational  Health - Occupational Stress Questionnaire    Feeling of Stress : To some extent  Social Connections: Moderately Isolated (07/25/2023)   Social Connection and Isolation Panel    Frequency of Communication with Friends and Family: Twice a week    Frequency of Social Gatherings with Friends and Family: Three times a week    Attends Religious Services: More than 4 times per year    Active Member of Clubs or Organizations: No  Attends Banker Meetings: Never    Marital Status: Widowed  Intimate Partner Violence: Not At Risk (07/25/2023)   Humiliation, Afraid, Rape, and Kick questionnaire    Fear of Current or Ex-Partner: No    Emotionally Abused: No    Physically Abused: No    Sexually Abused: No    Review of Systems:  All other review of systems negative except as mentioned in the HPI.  Physical Exam: Vital signs in last 24 hours: BP (!) 196/93   Pulse 77   Temp (!) 97.5 F (36.4 C) (Temporal)   Ht 5' 1 (1.549 m)   Wt 206 lb (93.4 kg)   SpO2 96%   BMI 38.92 kg/m  General:   Alert, NAD Lungs:  Clear .   Heart:  Regular rate and rhythm Abdomen:  Soft, nontender and nondistended. Neuro/Psych:  Alert and cooperative. Normal mood and affect. A and O x 3  Reviewed labs, radiology imaging, old records and pertinent past GI work up  Patient is appropriate for planned procedure(s) and anesthesia in an ambulatory setting   K. Veena Birdell Frasier , MD [phone]

## 2023-10-25 ENCOUNTER — Telehealth: Payer: Self-pay | Admitting: *Deleted

## 2023-10-25 NOTE — Telephone Encounter (Signed)
  Follow up Call-     10/24/2023   10:31 AM  Call back number  Post procedure Call Back phone  # [phone]  Permission to leave phone message Yes     Patient questions:  Do you have a fever, pain , or abdominal swelling? No. Pain Score  0 *  Have you tolerated food without any problems? Yes.    Have you been able to return to your normal activities? Yes.    Do you have any questions about your discharge instructions: Diet   No. Medications  No. Follow up visit  No.  Do you have questions or concerns about your Care? No.  Actions: * If pain score is 4 or above: No action needed, pain <4.

## 2023-10-26 LAB — SURGICAL PATHOLOGY

## 2023-11-24 ENCOUNTER — Ambulatory Visit: Attending: Internal Medicine | Admitting: Internal Medicine

## 2023-11-24 ENCOUNTER — Ambulatory Visit
Admission: RE | Admit: 2023-11-24 | Discharge: 2023-11-24 | Disposition: A | Discharge status: Home / Self Care | Source: Ambulatory Visit | Attending: Internal Medicine | Admitting: Internal Medicine

## 2023-11-24 ENCOUNTER — Encounter: Payer: Self-pay | Admitting: Internal Medicine

## 2023-11-24 VITALS — BP 169/99 | HR 74 | Temp 97.7°F | Ht 61.0 in | Wt 213.0 lb

## 2023-11-24 DIAGNOSIS — F02A0 Dementia in other diseases classified elsewhere, mild, without behavioral disturbance, psychotic disturbance, mood disturbance, and anxiety: Secondary | ICD-10-CM

## 2023-11-24 DIAGNOSIS — M25562 Pain in left knee: Secondary | ICD-10-CM

## 2023-11-24 DIAGNOSIS — G8929 Other chronic pain: Secondary | ICD-10-CM

## 2023-11-24 DIAGNOSIS — I10 Essential (primary) hypertension: Secondary | ICD-10-CM

## 2023-11-24 DIAGNOSIS — E785 Hyperlipidemia, unspecified: Secondary | ICD-10-CM | POA: Diagnosis not present

## 2023-11-24 DIAGNOSIS — Z6841 Body Mass Index (BMI) 40.0 and over, adult: Secondary | ICD-10-CM

## 2023-11-24 DIAGNOSIS — N1831 Chronic kidney disease, stage 3a: Secondary | ICD-10-CM

## 2023-11-24 DIAGNOSIS — G300 Alzheimer's disease with early onset: Secondary | ICD-10-CM

## 2023-11-24 DIAGNOSIS — I129 Hypertensive chronic kidney disease with stage 1 through stage 4 chronic kidney disease, or unspecified chronic kidney disease: Secondary | ICD-10-CM

## 2023-11-24 MED ORDER — AMLODIPINE BESYLATE 10 MG PO TABS: 10.0000 mg | ORAL_TABLET | Freq: Every day | ORAL | 3 refills | Status: AC

## 2023-11-24 MED ORDER — NEBIVOLOL HCL 5 MG PO TABS
5.0000 mg | ORAL_TABLET | Freq: Every day | ORAL | 3 refills | Status: AC
Start: 2023-11-24 — End: ?

## 2023-11-24 NOTE — Progress Notes (Signed)
 Patient ID: Abigail Powell, female    DOB: [birth year]  MRN: [id number]  CC: Hypertension (HTN f/u. Med refill./No questions / concerns /No to shingles vax)   Subjective: Abigail Powell is a 73 y.o. female who presents for chronic ds management.  Daughter, Bartholome, is with her  Her concerns today include:  Patient with history of HTN, TIA 2019, EtOH use disorder in sustain remission, HL, GBS, dementia, hyperthyroidism, left eye tumor status post resection meningioma, Vit D def   Discussed the use of AI scribe software for clinical note transcription with the patient, who gave verbal consent to proceed.  History of Present Illness LANIYA FRIEDL is a 73 year old female with hypertension, hyperlipidemia, and chronic kidney disease who presents for a four-month follow-up. She is accompanied by her daughter, Bartholome, who is her primary caregiver.  Her current blood pressure is 170/91. She takes amlodipine  10 mg daily and is compliant with her medication. She does not regularly monitor her blood pressure at home despite having a device. Her daughter notes occasional extra salt use. No chest pain, shortness of breath, leg swelling, headaches, or dizziness, except when overheated.  Her hyperlipidemia is managed with atorvastatin  20 mg daily. Her last cholesterol check in March showed an LDL of 98, which is within the target range.  Regarding her chronic kidney disease, her kidney function is suboptimal and is being monitored. GFR has been in the 50s. She avoids NSAIDs like ibuprofen due to her kidney function.  She has a history of dementia, with no noted worsening in memory or behavior. She does not drive and is supervised when cooking; daughter allows her to do so rarely. She enjoys walking daily in the parking lot of their apartment for exercise, but her daughter limits this activity during hot weather. She uses a calendar at home to help with orientation to time.  She has experienced weight gain, from 206  pounds in March to 213 pounds currently. Her daughter reports that she sometimes sneaks extra servings after meals. She does not snack much but keeps hard candy for dry mouth.  She reports chronic left knee pain, described as stiffness and pain, rated about 5 out of 10. The pain is intermittent, and she takes Tylenol  for relief, which she finds helpful. No recent falls.    Patient Active Problem List   Diagnosis Date Noted   Mild early onset Alzheimer's dementia without behavioral disturbance, psychotic disturbance, mood disturbance, or anxiety (HCC) 06/18/2022   TIA (transient ischemic attack) 01/30/2018   Hx of Guillain-Barre syndrome 09/06/2012   Memory loss 09/06/2012   Severe alcohol use disorder, in sustained remission (HCC) 03/06/2012   Subclinical hyperthyroidism 03/03/2012   Hypokalemia 03/03/2012   Transaminitis 03/03/2012   Encephalopathy acute 03/01/2012   Weakness 03/01/2012   Hypertension 03/01/2012   Hyperlipidemia 03/01/2012     Current Outpatient Medications on File Prior to Visit  Medication Sig Dispense Refill   atorvastatin  (LIPITOR) 20 MG tablet TAKE 1 TABLET BY MOUTH DAILY AT  6 PM. 90 tablet 3   Calcium  Carbonate-Vitamin D  (CALCIUM  + D PO) Take 600 mg by mouth 2 (two) times daily.     Cholecalciferol (VITAMIN D  PO) Take 1 tablet by mouth daily.     vitamin B-12 (CYANOCOBALAMIN ) 1000 MCG tablet Take 1,000 mcg by mouth daily.     VITAMIN E PO Take 1 capsule by mouth daily.     acetaminophen  (TYLENOL ) 325 MG tablet 1 tablet as needed Orally as  needed (Patient not taking: Reported on 11/24/2023)     No current facility-administered medications on file prior to visit.    Allergies  Allergen Reactions   Phenergan  [Promethazine  Hcl] Shortness Of Breath   Asa [Aspirin ] Other (See Comments)    Causes ulcer   Memantine  Other (See Comments)   Zofran  [Ondansetron  Hcl] Other (See Comments)    Unknown reaction per pt    Social History   Socioeconomic History    Marital status: Legally Separated    Spouse name: Not on file   Number of children: Not on file   Years of education: Not on file   Highest education level: Not on file  Occupational History   Not on file  Tobacco Use   Smoking status: Former   Smokeless tobacco: Never  Vaping Use   Vaping status: Never Used  Substance and Sexual Activity   Alcohol use: Yes    Comment: pt's daughter reports pt drinks beer and scotch every day keeping a thermos filled all day; unsure of how much     Drug use: No   Sexual activity: Not on file  Other Topics Concern   Not on file  Social History Narrative   Not on file   Social Drivers of Health   Financial Resource Strain: Low Risk  (07/25/2023)   Overall Financial Resource Strain (CARDIA)    Difficulty of Paying Living Expenses: Not hard at all  Food Insecurity: No Food Insecurity (07/25/2023)   Hunger Vital Sign    Worried About Running Out of Food in the Last Year: Never true    Ran Out of Food in the Last Year: Never true  Transportation Needs: No Transportation Needs (07/25/2023)   PRAPARE - Administrator, Civil Service (Medical): No    Lack of Transportation (Non-Medical): No  Physical Activity: Insufficiently Active (07/25/2023)   Exercise Vital Sign    Days of Exercise per Week: 5 days    Minutes of Exercise per Session: 20 min  Stress: Stress Concern Present (07/25/2023)   [employer] of Occupational Health - Occupational Stress Questionnaire    Feeling of Stress : To some extent  Social Connections: Moderately Isolated (07/25/2023)   Social Connection and Isolation Panel    Frequency of Communication with Friends and Family: Twice a week    Frequency of Social Gatherings with Friends and Family: Three times a week    Attends Religious Services: More than 4 times per year    Active Member of Clubs or Organizations: No    Attends Banker Meetings: Never    Marital Status: Widowed  Intimate Partner  Violence: Not At Risk (07/25/2023)   Humiliation, Afraid, Rape, and Kick questionnaire    Fear of Current or Ex-Partner: No    Emotionally Abused: No    Physically Abused: No    Sexually Abused: No    Family History  Problem Relation Age of Onset   Ovarian cancer Mother    Prostate cancer Father    Alcoholism Father    Hypertension Other    Colon polyps Neg Hx    Colon cancer Neg Hx    Esophageal cancer Neg Hx    Stomach cancer Neg Hx    Rectal cancer Neg Hx     Past Surgical History:  Procedure Laterality Date   ABDOMINAL HYSTERECTOMY     CHOLECYSTECTOMY  02/26/2012   Procedure: LAPAROSCOPIC CHOLECYSTECTOMY WITH INTRAOPERATIVE CHOLANGIOGRAM;  Surgeon: Redell Faith, DO;  Location: [facility];  Service: General;  Laterality: N/A;   Orthoscopic knee surgery     TUMOR REMOVAL Left 08/07/12   behind left eye   WISDOM TOOTH EXTRACTION      ROS: Review of Systems Negative except as stated above  PHYSICAL EXAM: BP (!) 169/99   Pulse 74   Temp 97.7 F (36.5 C) (Oral)   Ht 5' 1 (1.549 m)   Wt 213 lb (96.6 kg)   SpO2 97%   BMI 40.25 kg/m   Wt Readings from Last 3 Encounters:  11/24/23 213 lb (96.6 kg)  10/24/23 206 lb (93.4 kg)  10/07/23 206 lb (93.4 kg)    Physical Exam  General appearance - alert, well appearing, and in no distress Mental status -patient is alert and interactive.  She is able to tell me the month but does not recall the day of the week or the year. Neck - supple, no significant adenopathy Chest - clear to auscultation, no wheezes, rales or rhonchi, symmetric air entry Heart - normal rate, regular rhythm, normal S1, S2, no murmurs, rubs, clicks or gallops Musculoskeletal -left knee: Large body habitus.  Mild tenderness with palpation of the medial aspect of the knee.  Slow but good range of motion.  She ambulates without assistive device. Extremities -no lower extremity edema.      Latest Ref Rng & Units 07/25/2023    3:44 PM 06/18/2022   10:15 AM  01/30/2018    2:26 AM  CMP  Glucose 70 - 99 mg/dL 892  82    BUN 8 - 27 mg/dL 19  25    Creatinine 9.42 - 1.00 mg/dL 8.98  8.94  9.25   Sodium 134 - 144 mmol/L 143  143    Potassium 3.5 - 5.2 mmol/L 4.4  5.3    Chloride 96 - 106 mmol/L 104  105    CO2 20 - 29 mmol/L 25  22    Calcium  8.7 - 10.3 mg/dL 89.8  9.7    Total Protein 6.0 - 8.5 g/dL 7.5  7.0    Total Bilirubin 0.0 - 1.2 mg/dL 0.5  0.4    Alkaline Phos 44 - 121 IU/L 92  88    AST 0 - 40 IU/L 17  18    ALT 0 - 32 IU/L 13  12     Lipid Panel     Component Value Date/Time   CHOL 170 07/25/2023 1544   TRIG 135 07/25/2023 1544   HDL 48 07/25/2023 1544   CHOLHDL 3.5 07/25/2023 1544   CHOLHDL 3.6 01/30/2018 0220   VLDL 11 01/30/2018 0220   LDLCALC 98 07/25/2023 1544    CBC    Component Value Date/Time   WBC 6.4 07/25/2023 1544   WBC 6.7 01/30/2018 0226   RBC 4.65 07/25/2023 1544   RBC 4.22 01/30/2018 0226   HGB 14.3 07/25/2023 1544   HCT 43.4 07/25/2023 1544   PLT 331 07/25/2023 1544   MCV 93 07/25/2023 1544   MCH 30.8 07/25/2023 1544   MCH 31.3 01/30/2018 0226   MCHC 32.9 07/25/2023 1544   MCHC 32.2 01/30/2018 0226   RDW 12.0 07/25/2023 1544   LYMPHSABS 2.0 01/29/2018 1906   MONOABS 0.7 01/29/2018 1906   EOSABS 0.1 01/29/2018 1906   BASOSABS 0.0 01/29/2018 1906    ASSESSMENT AND PLAN:  Assessment and Plan Assessment & Plan Hypertension Blood pressure elevated at 170/91 mmHg. Target is 130/80 mmHg. On maximum dose of amlodipine . Bystolic  added due to persistent hypertension.  Discussed reducing dietary sodium. - Instruct to monitor blood pressure at home twice weekly and bring readings to the next appointment in 1 mth with clinical pharmacist. - Advise to reduce dietary sodium intake.  Chronic Kidney Disease 3b Kidney function suboptimal. Advised to avoid NSAIDs due to nephrotoxicity.  Hyperlipidemia LDL cholesterol at 98 mg/dL, meeting goal. On atorvastatin  20 mg daily.  Chronic Left Knee  Pain Intermittent pain and stiffness likely due to arthritis. Likely due to OA. Wgh loss encouraged. Discussed orthopedic evaluation and joint injection but pt declined at this time. Continue Tylenol  Arthritis as needed - Order x-ray of the left knee at [facility].  Morbid Obesity Weight increased by 7 pounds. Discussed portion control and weight impact on knee pain.  - Advise to reduce portion sizes and consider healthy snacks like nuts and fruits. - Encourage continued walking for exercise.  Dementia Alzheimer's type No worsening of memory or cognitive function. Supervised by her daughter. Continue regular exercise. - Recommend using a calendar for orientation to date and time.  General Health Maintenance Discussed dietary modifications to support health, including reducing sugar intake to prevent diabetes risk. - Advise to switch to sugar-free candy to reduce sugar intake.    Patient was given the opportunity to ask questions.  Patient verbalized understanding of the plan and was able to repeat key elements of the plan.   This documentation was completed using Paediatric nurse.  Any transcriptional errors are unintentional.  Orders Placed This Encounter  Procedures   DG Knee Complete 4 Views Left     Requested Prescriptions   Signed Prescriptions Disp Refills   amLODipine  (NORVASC ) 10 MG tablet 90 tablet 3    Sig: Take 1 tablet (10 mg total) by mouth daily.   nebivolol  (BYSTOLIC ) 5 MG tablet 90 tablet 3    Sig: Take 1 tablet (5 mg total) by mouth daily.    Return in about 4 months (around 03/26/2024) for 4 weeks with clinical pharmacist for HTN.  Barnie Louder, MD, FACP

## 2023-11-24 NOTE — Patient Instructions (Addendum)
 VISIT SUMMARY:  Today, you came in for your four-month follow-up appointment. We discussed your hypertension, hyperlipidemia, chronic kidney disease, chronic left knee pain, weight gain, and dementia. Your daughter, Grayce, accompanied you and provided helpful information about your health and daily habits.  YOUR PLAN:  -HYPERTENSION: Your blood pressure is higher than the target range. We are adding a new medication, Bystolic , to help lower it. Please monitor your blood pressure at home twice a week and bring the readings to your next appointment. Also, try to reduce the amount of salt in your diet.  -CHRONIC KIDNEY DISEASE: Your kidney function is not optimal, so it's important to avoid medications like ibuprofen that can harm your kidneys.  -HYPERLIPIDEMIA: Your cholesterol levels are within the target range, and you should continue taking atorvastatin  20 mg daily.  -CHRONIC LEFT KNEE PAIN: Your knee pain is likely due to arthritis. We will order an x-ray of your left knee and may refer you to an orthopedic specialist for further evaluation and possible joint injection. Continue taking Tylenol  for pain relief.  -WEIGHT GAIN: You have gained some weight since your last visit. Reducing portion sizes and choosing healthy snacks like nuts and fruits can help. Keep up with your walking exercise, but be mindful of the weather.  -DEMENTIA: There has been no worsening in your memory or cognitive function. Continue using a calendar to help with orientation to date and time, and try to engage in activities outside the home.  -GENERAL HEALTH MAINTENANCE: We discussed the importance of dietary modifications to support your overall health. Try to reduce your sugar intake to prevent diabetes risk, and consider switching to sugar-free candy.  INSTRUCTIONS:  Please monitor your blood pressure at home twice a week and bring the readings to your next appointment. We will also order an x-ray of your left knee  at [facility]. Consider a referral to an orthopedic specialist for further evaluation and possible joint injection.

## 2023-11-29 ENCOUNTER — Ambulatory Visit: Payer: Self-pay | Admitting: Internal Medicine

## 2023-12-01 ENCOUNTER — Telehealth: Payer: Self-pay | Admitting: Internal Medicine

## 2023-12-01 NOTE — Telephone Encounter (Signed)
 Pt drop off paperwork

## 2023-12-06 ENCOUNTER — Telehealth: Admitting: Internal Medicine

## 2023-12-06 DIAGNOSIS — M1712 Unilateral primary osteoarthritis, left knee: Secondary | ICD-10-CM | POA: Diagnosis not present

## 2023-12-06 DIAGNOSIS — G300 Alzheimer's disease with early onset: Secondary | ICD-10-CM | POA: Diagnosis not present

## 2023-12-06 DIAGNOSIS — Z029 Encounter for administrative examinations, unspecified: Secondary | ICD-10-CM

## 2023-12-06 DIAGNOSIS — F02A0 Dementia in other diseases classified elsewhere, mild, without behavioral disturbance, psychotic disturbance, mood disturbance, and anxiety: Secondary | ICD-10-CM

## 2023-12-06 NOTE — Progress Notes (Unsigned)
 Patient ID: Abigail Powell, female   DOB: August 29, 1950, 73 y.o.   MRN: [id number] Virtual Visit via Video Note  I connected with Abigail Powell on 12/06/2023 at 1:33 PM by a video enabled telemedicine application and verified that I am speaking with the correct person using two identifiers.  Location: Patient: home. Daughter, Bartholome is with her Provider: Office   I discussed the limitations of evaluation and management by telemedicine and the availability of in person appointments. The patient expressed understanding and agreed to proceed.  History of Present Illness: Patient with history of HTN, TIA 2019, CKD, EtOH use disorder in sustain remission, HL, GBS, dementia, hyperthyroidism, left eye tumor status post resection meningioma, Vit D def    Discussed the use of AI scribe software for clinical note transcription with the patient, who gave verbal consent to proceed.  History of Present Illness Abigail Powell is a 73 year old female who presents for assistance with a college loan discharge form. She is accompanied by Bartholome, her daughter and primary caregiver.  She has a history of dementia and Guillain-Barre syndrome, which necessitates assistance with medication management and transportation as she does not drive. Her daughter, Bartholome, assists with these tasks. She is able to bathe herself using a shower but requires assistance with selecting clothes. For safety reasons, Bartholome also handles cooking.  She experiences limitations in her physical activities. She can sit for 30 minutes to an hour but can only stand for about 10 to 15 minutes due to knee pain. Walking is limited to short distances with frequent breaks.  Socially, she attends church but does not engage in extensive social interactions. She is not involved in clubs or other social groups. Housekeeping is primarily managed by Bartholome, with her participating when prompted.   Observations/Objective:     07/22/2022   10:15 AM 03/09/2018    11:51 AM 02/22/2014   12:01 PM  MMSE - Mini Mental State Exam  Orientation to time 1 2 2    Orientation to Place 3 1 3    Registration 3 3 3    Attention/ Calculation 5 3 5    Recall 1 0 0   Language- name 2 objects 2 2 2    Language- repeat 1 1 1   Language- follow 3 step command 3 3 3    Language- read & follow direction 1 1 1    Write a sentence 1 1 1    Copy design 1 1 1    Total score 22 18 22       Data saved with a previous flowsheet row definition     Assessment and Plan:   Follow Up Instructions:    I discussed the assessment and treatment plan with the patient. The patient was provided an opportunity to ask questions and all were answered. The patient agreed with the plan and demonstrated an understanding of the instructions.   The patient was advised to call back or seek an in-person evaluation if the symptoms worsen or if the condition fails to improve as anticipated.  I spent ------ minutes dedicated to the care of this patient on the date of this encounter to include previsit review of  This note has been created with Education officer, environmental. Any transcriptional errors are unintentional.  Barnie Louder, MD

## 2023-12-08 ENCOUNTER — Telehealth: Payer: Self-pay

## 2023-12-08 NOTE — Telephone Encounter (Signed)
 Called patient daughter who is on DPR and made them aware of paperwork that is ready for pick up

## 2023-12-09 ENCOUNTER — Ambulatory Visit: Payer: Self-pay | Admitting: Gastroenterology

## 2023-12-22 ENCOUNTER — Ambulatory Visit: Attending: Internal Medicine | Admitting: Pharmacist

## 2023-12-22 VITALS — BP 155/81 | HR 58

## 2023-12-22 DIAGNOSIS — I10 Essential (primary) hypertension: Secondary | ICD-10-CM

## 2023-12-22 NOTE — Progress Notes (Signed)
 S:     [name] who presents for hypertension evaluation, education, and management. Patient was referred and last seen by Primary Care Provider, Abigail Powell, on 11/24/2023.  At that visit, BP was 171/91 mmHg. Nebivolol  was added.    PMH is significant for HTN w/ hx of TIA, HLD, CKD, dementia, hx of GBS, OH use disorder, transaminitis.   Today, patient arrives in good spirits and presents with his daughter. Denies dizziness, headache, blurred vision, swelling.   Patient reports hypertension is longstanding.   Family/Social history:  -Fhx: HTN, ovarian cancer, prostate cancer, alcoholism  -Tobacco: former smoker  -Alcohol: none reported   Medication adherence reported. Patient reports taking blood pressure medications today.  Current antihypertensives include: amlodipine  10 mg daily, nebivolol  5 mg daily   Antihypertensives tried in the past include: Azor  (amlodipine -olmesartan ), irbesartan , Toprol  XL  Reported home blood pressure readings:  -140s-150s/90s.  Patient reported dietary habits:  -Admits to some dietary indiscretion concerning sodium. Per her daughter, she's eating more salt than she should -Denies excessive consumption of caffeine   Patient-reported exercise habits:  -Walks for ~15 minutes daily when she can   O:  Vitals:   12/22/23 1512  BP: (!) 155/81  Pulse: (!) 58   Last 3 Office BP readings: BP Readings from Last 3 Encounters:  12/22/23 (!) 155/81  11/24/23 (!) 169/99  10/24/23 134/74    BMET    Component Value Date/Time   NA 143 07/25/2023 1544   K 4.4 07/25/2023 1544   CL 104 07/25/2023 1544   CO2 25 07/25/2023 1544   GLUCOSE 107 (H) 07/25/2023 1544   GLUCOSE 113 (H) 01/29/2018 1906   BUN 19 07/25/2023 1544   CREATININE 1.01 (H) 07/25/2023 1544   CALCIUM  10.1 07/25/2023 1544   GFRNONAA >60 01/30/2018 0226   GFRAA >60 01/30/2018 0226    Renal function: CrCl cannot be calculated (Patient's most recent  lab result is older than the maximum 21 days allowed.).  Clinical ASCVD: Yes  The 10-year ASCVD risk score (Arnett DK, et al., 2019) is: 16.1%   Values used to calculate the score:     Age: 34 years     Clincally relevant sex: Female     Is Non-Hispanic African American: Yes     Diabetic: No     Tobacco smoker: No     Systolic Blood Pressure: 155 mmHg     Is BP treated: Yes     HDL Cholesterol: 48 mg/dL     Total Cholesterol: 170 mg/dL  Patient is participating in a Managed Medicaid Plan: No    A/P: Hypertension diagnosed currently above goal but improved since adding nebivolol  to her regimen. BP goal < 130/80 mmHg. Medication adherence appears to be optimal. I recommend to avoid RAAS agents at this time with her recent hx of mild hyperkalemia. Of note, with her neurological hx and hyponatremia hx, I would also like to avoid thiazides if possible. We could consider increasing nebivolol  but she still needs to work on sodium restriction. Numbers at home are improved but still above goal. Will have her return in 1 month for a recheck.   -Continued amlodipine  10 mg daily.  -Continued nebivolol  5 mg daily .  -Patient educated on purpose, proper use, and potential adverse effects of nebivolol , amlodipine .  -F/u labs ordered - none today -Counseled on lifestyle modifications for blood pressure control including reduced dietary sodium, increased exercise, adequate sleep. -Encouraged patient to  check BP at home and bring log of readings to next visit. Counseled on proper use of home BP cuff.   Results reviewed and written information provided.    Written patient instructions provided. Patient verbalized understanding of treatment plan.  Total time in face to face counseling 20 minutes.    Follow-up:  Pharmacist in 1 month.  Abigail Powell, PharmD, Abigail Powell, CPP Clinical Pharmacist [facility] [phone]

## 2024-02-03 ENCOUNTER — Telehealth: Payer: Self-pay | Admitting: Internal Medicine

## 2024-02-03 NOTE — Telephone Encounter (Signed)
 Confirmed appt for 9/29

## 2024-02-06 ENCOUNTER — Ambulatory Visit: Payer: Self-pay | Attending: Internal Medicine | Admitting: Pharmacist

## 2024-02-06 ENCOUNTER — Encounter: Payer: Self-pay | Admitting: Pharmacist

## 2024-02-06 VITALS — BP 134/76 | HR 52

## 2024-02-06 DIAGNOSIS — I10 Essential (primary) hypertension: Secondary | ICD-10-CM

## 2024-02-06 NOTE — Progress Notes (Signed)
 S:     [name] who presents for hypertension evaluation, education, and management. Patient was referred and last seen by Primary Care Provider, Dr. Vicci, on 11/24/2023.  At that visit, BP was 171/91 mmHg. Nebivolol  was added.    PMH is significant for HTN w/ hx of TIA, HLD, CKD, dementia, hx of GBS, EtOH use disorder, transaminitis.   Today, patient arrives in good spirits and presents with his daughter. Denies dizziness, headache, blurred vision, swelling.   Patient reports hypertension is longstanding.   Family/Social history:  -Fhx: HTN, ovarian cancer, prostate cancer, alcoholism  -Tobacco: former smoker  -Alcohol: none reported   Medication adherence reported. Patient reports taking blood pressure medications today.  Current antihypertensives include: amlodipine  10 mg daily, nebivolol  5 mg daily   Antihypertensives tried in the past include: Azor  (amlodipine -olmesartan ), irbesartan , Toprol  XL  Reported home blood pressure readings: once a day in the evening Avg: 150s/90s -154/91 -155/93 -153/91 -152/90 -149/91 -151/92 -154/91 -152/91 -155/92  -134/76, HR 52  Patient reported dietary habits:  -Admits to some dietary indiscretion concerning sodium. Per her daughter, she's eating more salt than she should - typically eats what she wants -Caffeine consumption has increased   Patient-reported exercise habits:  -Walks for ~15 minutes daily when she can   O:  Vitals:   02/06/24 1141  BP: 134/76  Pulse: (!) 52    Last 3 Office BP readings: BP Readings from Last 3 Encounters:  02/06/24 134/76  12/22/23 (!) 155/81  11/24/23 (!) 169/99    BMET    Component Value Date/Time   NA 143 07/25/2023 1544   K 4.4 07/25/2023 1544   CL 104 07/25/2023 1544   CO2 25 07/25/2023 1544   GLUCOSE 107 (H) 07/25/2023 1544   GLUCOSE 113 (H) 01/29/2018 1906   BUN 19 07/25/2023 1544   CREATININE 1.01 (H) 07/25/2023 1544   CALCIUM  10.1  07/25/2023 1544   GFRNONAA >60 01/30/2018 0226   GFRAA >60 01/30/2018 0226    Renal function: CrCl cannot be calculated (Patient's most recent lab result is older than the maximum 21 days allowed.).  Clinical ASCVD: Yes  The 10-year ASCVD risk score (Arnett DK, et al., 2019) is: 13%   Values used to calculate the score:     Age: 42 years     Clincally relevant sex: Female     Is Non-Hispanic African American: Yes     Diabetic: No     Tobacco smoker: No     Systolic Blood Pressure: 134 mmHg     Is BP treated: Yes     HDL Cholesterol: 48 mg/dL     Total Cholesterol: 170 mg/dL  Patient is participating in a Managed Medicaid Plan: No    A/P: Hypertension diagnosed currently above goal but improved since adding nebivolol  to her regimen. BP goal < 130/80 mmHg. Medication adherence appears to be optimal. BP in clinic today has significantly improved since her last office visit. Of note, her home readings have been elevated in the 150s/90s. After discussing with daughter, patient has increased caffeine intake that can be into the afternoons/evenings. She continues to intake sodium and has not made improvement there. Increasing nebivolol  was discussed again, but patients daughter is hesitant about increasing due to preexisting conditions. Despite in clinic BP, home values are above goal. No changes will be made at this time and close monitoring will take place. Will have her return with 1 month for a  recheck along with BP machine to validate her home readings. -Continued amlodipine  10 mg daily.  -Continued nebivolol  5 mg daily .  -Patient educated on purpose, proper use, and potential adverse effects of nebivolol , amlodipine .  -F/u labs ordered - none today -Counseled on lifestyle modifications for blood pressure control including reduced dietary sodium, increased exercise, adequate sleep. -Encouraged patient to check BP at home and bring log of readings to next visit. Counseled on proper use  of home BP cuff.   Results reviewed and written information provided.    Written patient instructions provided. Patient verbalized understanding of treatment plan.  Total time in face to face counseling 20 minutes.    Follow-up:  Pharmacist in 1 month PCP on 03/26/24 with Dr. Vicci Jenkins Graces, PharmD PGY1 Pharmacy Resident [phone]  Herlene Fleeta Morris, PharmD, BCACP, CPP Clinical Pharmacist [facility] [phone]

## 2024-03-08 ENCOUNTER — Ambulatory Visit: Admitting: Pharmacist

## 2024-03-20 ENCOUNTER — Ambulatory Visit

## 2024-03-20 VITALS — BP 121/81 | Ht 62.0 in | Wt 213.0 lb

## 2024-03-20 DIAGNOSIS — Z0000 Encounter for general adult medical examination without abnormal findings: Secondary | ICD-10-CM

## 2024-03-20 NOTE — Patient Instructions (Signed)
 Ms. Terrones,  Thank you for taking the time for your Medicare Wellness Visit. I appreciate your continued commitment to your health goals. Please review the care plan we discussed, and feel free to reach out if I can assist you further.  Please note that Annual Wellness Visits do not include a physical exam. Some assessments may be limited, especially if the visit was conducted virtually. If needed, we may recommend an in-person follow-up with your provider.  Ongoing Care Seeing your primary care provider every 3 to 6 months helps us  monitor your health and provide consistent, personalized care.   Referrals If a referral was made during today's visit and you haven't received any updates within two weeks, please contact the referred provider directly to check on the status.  Recommended Screenings:  Health Maintenance  Topic Date Due   Zoster (Shingles) Vaccine (1 of 2) Never done   COVID-19 Vaccine (2 - Pfizer risk series) 04/15/2020   Flu Shot  12/09/2023   Medicare Annual Wellness Visit  03/20/2025   Breast Cancer Screening  04/13/2025   Colon Cancer Screening  10/24/2026   DTaP/Tdap/Td vaccine (2 - Td or Tdap) 08/04/2032   Pneumococcal Vaccine for age over 27  Completed   DEXA scan (bone density measurement)  Completed   Hepatitis C Screening  Completed   Meningitis B Vaccine  Aged Out   Screening for Lung Cancer  Discontinued       07/22/2022    9:57 AM  Advanced Directives  Does Patient Have a Medical Advance Directive? No  Would patient like information on creating a medical advance directive? Yes (MAU/Ambulatory/Procedural Areas - Information given)    Vision: Annual vision screenings are recommended for early detection of glaucoma, cataracts, and diabetic retinopathy. These exams can also reveal signs of chronic conditions such as diabetes and high blood pressure.  Dental: Annual dental screenings help detect early signs of oral cancer, gum disease, and other conditions  linked to overall health, including heart disease and diabetes.  Please see the attached documents for additional preventive care recommendations.

## 2024-03-20 NOTE — Progress Notes (Signed)
 Subjective:   Abigail Powell is a 73 y.o. female who presents for a Medicare Annual Wellness Visit.  Allergies (verified) Phenergan  [promethazine  hcl], Asa [aspirin ], Memantine , and Zofran  [ondansetron  hcl]   History: Past Medical History:  Diagnosis Date   Adult failure to thrive    Alcohol abuse, unspecified    hx of   Allergy    seasonal allergies   Benign neoplasm of eye, part unspecified    Benign tumor of eye    Behind the left eye, unclear if benign   Blood transfusion without reported diagnosis    hx of   Diverticulosis    Essential hypertension, benign    GERD (gastroesophageal reflux disease)    on meds   H/O gastric ulcer    Hx of Guillain-Barre syndrome 09/06/2012   Hypertension    on meds   Hypopotassemia    Insomnia, unspecified    Lack of coordination    Memory loss 09/06/2012   Muscle weakness (generalized)    Other and unspecified hyperlipidemia    on meds   Other quadriplegia and quadriparesis    Pneumonia, organism unspecified(486)    Pneumonia, organism unspecified(486) 2013   History of pneumonia   Polyneuropathy in diabetes(357.2)    History of    Symbolic dysfunction, unspecified    Thyrotoxicosis without mention of goiter or other cause, without mention of thyrotoxic crisis or storm    hx of   TIA (transient ischemic attack) 01/2018   Past Surgical History:  Procedure Laterality Date   ABDOMINAL HYSTERECTOMY     CHOLECYSTECTOMY  02/26/2012   Procedure: LAPAROSCOPIC CHOLECYSTECTOMY WITH INTRAOPERATIVE CHOLANGIOGRAM;  Surgeon: Redell Faith, DO;  Location: [facility];  Service: General;  Laterality: N/A;   Orthoscopic knee surgery     TUMOR REMOVAL Left 08/07/12   behind left eye   WISDOM TOOTH EXTRACTION     Family History  Problem Relation Age of Onset   Ovarian cancer Mother    Prostate cancer Father    Alcoholism Father    Hypertension Other    Colon polyps Neg Hx    Colon cancer Neg Hx    Esophageal cancer Neg Hx    Stomach cancer  Neg Hx    Rectal cancer Neg Hx    Social History   Occupational History   Not on file  Tobacco Use   Smoking status: Former   Smokeless tobacco: Never  Vaping Use   Vaping status: Never Used  Substance and Sexual Activity   Alcohol use: Yes    Comment: pt's daughter reports pt drinks beer and scotch every day keeping a thermos filled all day; unsure of how much     Drug use: No   Sexual activity: Not on file   Tobacco Counseling Counseling given: Not Answered  SDOH Screenings   Food Insecurity: No Food Insecurity (03/18/2024)  Housing: Low Risk  (03/18/2024)  Transportation Needs: No Transportation Needs (03/18/2024)  Utilities: Not At Risk (03/20/2024)  Alcohol Screen: Low Risk  (07/25/2023)  Depression (PHQ2-9): Low Risk  (03/20/2024)  Financial Resource Strain: Low Risk  (03/18/2024)  Physical Activity: Insufficiently Active (03/18/2024)  Social Connections: Moderately Isolated (03/18/2024)  Stress: No Stress Concern Present (03/18/2024)  Tobacco Use: Medium Risk (03/20/2024)  Health Literacy: Adequate Health Literacy (03/20/2024)   Depression Screen    03/20/2024   11:23 AM 11/24/2023    1:38 PM 07/25/2023    2:54 PM 01/06/2023    4:01 PM 11/22/2022   11:48 AM 07/22/2022  9:57 AM  PHQ 2/9 Scores  PHQ - 2 Score 0 2 0 0 0 0  PHQ- 9 Score  4  4  1  1        Data saved with a previous flowsheet row definition     Goals Addressed               This Visit's Progress     walk a little more and stay healthy (pt-stated)        Walk and stay healthy        Visit info / Clinical Intake: Medicare Wellness Visit Type:: Subsequent Annual Wellness Visit Persons participating in visit:: patient & caregiver (sonya daughter) Information given by:: patient Interpreter Needed?: No Pre-visit prep was completed: yes AWV questionnaire completed by patient prior to visit?: yes Date:: 03/18/24 Living arrangements:: with family/others Patient's Overall Health Status Rating:  good Typical amount of pain: none Does pain affect daily life?: no Are you currently prescribed opioids?: no  Dietary Habits and Nutritional Risks How many meals a day?: 3 Eats fruit and vegetables daily?: yes Most meals are obtained by: eating out; preparing own meals (daughter) Diabetic:: no  Functional Status Activities of Daily Living (to include ambulation/medication): (!) (Proxy-Rptd) Dependent Ambulation: Independent with device- listed below Home Assistive Devices/Equipment: Eyeglasses Medication Administration: Needs assistance (comment) (daughter assist) Is this a change from baseline?: Pre-admission baseline Home Management: (Proxy-Rptd) Dependent Manage your own finances?: yes Primary transportation is: family/friends Concerns about vision?: no *vision screening is required for WTM* Concerns about hearing?: no  Fall Screening Falls in the past year?: (Proxy-Rptd) 0 Number of falls in past year: (Proxy-Rptd) 0 Was there an injury with Fall?: (Proxy-Rptd) 0 Fall Risk Category Calculator: (Proxy-Rptd) 0 Patient Fall Risk Level: (Proxy-Rptd) Low Fall Risk  Fall Risk Patient at Risk for Falls Due to: No Fall Risks Fall risk Follow up: Falls prevention discussed  Home and Transportation Safety: All rugs have non-skid backing?: N/A, no rugs All stairs or steps have railings?: N/A, no stairs Grab bars in the bathtub or shower?: (!) no Have non-skid surface in bathtub or shower?: (!) no Good home lighting?: yes Regular seat belt use?: yes Hospital stays in the last year:: no  Cognitive Assessment Difficulty concentrating, remembering, or making decisions? : yes Will 6CIT or Mini Cog be Completed: yes What year is it?: 0 points What month is it?: 0 points Give patient an address phrase to remember (5 components): [address] [place]  About what time is it?: 0 points Count backwards from 20 to 1: 0 points Say the months of the year in reverse: 2 points Repeat  the address phrase from earlier: 0 points 6 CIT Score: 2 points  Advance Directives (For Healthcare) Does Patient Have a Medical Advance Directive?: Yes Type of Advance Directive: Healthcare Power of Attorney Copy of Healthcare Power of Attorney in Chart?: No - copy requested  Reviewed/Updated  Reviewed/Updated: Reviewed All (Medical, Surgical, Family, Medications, Allergies, Care Teams, Patient Goals)        Objective:    Today's Vitals   03/20/24 1114  BP: 121/81  Weight: 213 lb (96.6 kg)  Height: 5' 2 (1.575 m)   Body mass index is 38.96 kg/m.  Current Medications (verified) Outpatient Encounter Medications as of 03/20/2024  Medication Sig   acetaminophen  (TYLENOL ) 325 MG tablet 1 tablet as needed Orally as needed   amLODipine  (NORVASC ) 10 MG tablet Take 1 tablet (10 mg total) by mouth daily.   atorvastatin  (LIPITOR)  20 MG tablet TAKE 1 TABLET BY MOUTH DAILY AT  6 PM.   Calcium  Carbonate-Vitamin D  (CALCIUM  + D PO) Take 600 mg by mouth 2 (two) times daily.   Cholecalciferol (VITAMIN D  PO) Take 1 tablet by mouth daily.   nebivolol  (BYSTOLIC ) 5 MG tablet Take 1 tablet (5 mg total) by mouth daily.   vitamin B-12 (CYANOCOBALAMIN ) 1000 MCG tablet Take 1,000 mcg by mouth daily.   VITAMIN E PO Take 1 capsule by mouth daily.   No facility-administered encounter medications on file as of 03/20/2024.   Hearing/Vision screen Hearing Screening - Comments:: Pt denies any hearing issues  Vision Screening - Comments:: Wears rx glasses - up to date with routine eye exams with DR Octavia  Immunizations and Health Maintenance Health Maintenance  Topic Date Due   Zoster Vaccines- Shingrix (1 of 2) Never done   COVID-19 Vaccine (2 - Pfizer risk series) 04/15/2020   Influenza Vaccine  08/07/2024 (Originally 12/09/2023)   Medicare Annual Wellness (AWV)  03/20/2025   Mammogram  04/13/2025   Colonoscopy  10/24/2026   DTaP/Tdap/Td (2 - Td or Tdap) 08/04/2032   Pneumococcal Vaccine: 50+  Years  Completed   DEXA SCAN  Completed   Hepatitis C Screening  Completed   Meningococcal B Vaccine  Aged Out   Lung Cancer Screening  Discontinued        Assessment/Plan:  This is a routine wellness examination for [facility].  Patient Care Team: Vicci Barnie NOVAK, MD as PCP - General (Internal Medicine)  I have personally reviewed and noted the following in the patient's chart:   Medical and social history Use of alcohol, tobacco or illicit drugs  Current medications and supplements including opioid prescriptions. Functional ability and status Nutritional status Physical activity Advanced directives List of other physicians Hospitalizations, surgeries, and ER visits in previous 12 months Vitals Screenings to include cognitive, depression, and falls Referrals and appointments  No orders of the defined types were placed in this encounter.  In addition, I have reviewed and discussed with patient certain preventive protocols, quality metrics, and best practice recommendations. A written personalized care plan for preventive services as well as general preventive health recommendations were provided to patient.   Ellouise VEAR Haws, LPN   [id number]/88/[id number]   Return in 1 year (on 03/20/2025).  After Visit Summary: (MyChart) Due to this being a telephonic visit, the after visit summary with patients personalized plan was offered to patient via MyChart   Nurse Notes: nothing significant at this time

## 2024-03-22 NOTE — Progress Notes (Signed)
 Subjective:   Abigail Powell is a 73 y.o. female who presents for a Medicare Annual Wellness Visit.  Allergies (verified) Phenergan  [promethazine  hcl], Asa [aspirin ], Memantine , and Zofran  [ondansetron  hcl]   I connected with  Abigail Powell on 03/20/24 by a video and audio enabled telemedicine application and verified that I am speaking with the correct person using two identifiers.  Patient Location: Home  Provider Location: Office/Clinic  Persons Participating in Visit: Patient assisted by Daughter .  I discussed the limitations of evaluation and management by telemedicine. The patient expressed understanding and agreed to proceed.   Vital Signs: Because this visit was a virtual/telehealth visit, some criteria may be missing or patient reported. Any vitals not documented were not able to be obtained and vitals that have been documented are patient reported.   If you're able to add these things as option in the [employer], we won't need it ... but for those who refuse to use the template, I guess it does need to be updated     History: Past Medical History:  Diagnosis Date   Adult failure to thrive    Alcohol abuse, unspecified    hx of   Allergy    seasonal allergies   Benign neoplasm of eye, part unspecified    Benign tumor of eye    Behind the left eye, unclear if benign   Blood transfusion without reported diagnosis    hx of   Diverticulosis    Essential hypertension, benign    GERD (gastroesophageal reflux disease)    on meds   H/O gastric ulcer    Hx of Guillain-Barre syndrome 09/06/2012   Hypertension    on meds   Hypopotassemia    Insomnia, unspecified    Lack of coordination    Memory loss 09/06/2012   Muscle weakness (generalized)    Other and unspecified hyperlipidemia    on meds   Other quadriplegia and quadriparesis    Pneumonia, organism unspecified(486)    Pneumonia, organism unspecified(486) 2013   History of pneumonia    Polyneuropathy in diabetes(357.2)    History of    Symbolic dysfunction, unspecified    Thyrotoxicosis without mention of goiter or other cause, without mention of thyrotoxic crisis or storm    hx of   TIA (transient ischemic attack) 01/2018   Past Surgical History:  Procedure Laterality Date   ABDOMINAL HYSTERECTOMY     CHOLECYSTECTOMY  02/26/2012   Procedure: LAPAROSCOPIC CHOLECYSTECTOMY WITH INTRAOPERATIVE CHOLANGIOGRAM;  Surgeon: Redell Faith, DO;  Location: [facility];  Service: General;  Laterality: N/A;   Orthoscopic knee surgery     TUMOR REMOVAL Left 08/07/12   behind left eye   WISDOM TOOTH EXTRACTION     Family History  Problem Relation Age of Onset   Ovarian cancer Mother    Prostate cancer Father    Alcoholism Father    Hypertension Other    Colon polyps Neg Hx    Colon cancer Neg Hx    Esophageal cancer Neg Hx    Stomach cancer Neg Hx    Rectal cancer Neg Hx    Social History   Occupational History   Not on file  Tobacco Use   Smoking status: Former   Smokeless tobacco: Never  Vaping Use   Vaping status: Never Used  Substance and Sexual Activity   Alcohol use: Yes    Comment: pt's daughter reports pt drinks beer and scotch every day keeping a thermos filled all  day; unsure of how much     Drug use: No   Sexual activity: Not on file   Tobacco Counseling Counseling given: Not Answered  SDOH Screenings   Food Insecurity: No Food Insecurity (03/18/2024)  Housing: Low Risk  (03/18/2024)  Transportation Needs: No Transportation Needs (03/18/2024)  Utilities: Not At Risk (03/20/2024)  Alcohol Screen: Low Risk  (07/25/2023)  Depression (PHQ2-9): Low Risk  (03/20/2024)  Financial Resource Strain: Low Risk  (03/18/2024)  Physical Activity: Insufficiently Active (03/18/2024)  Social Connections: Moderately Isolated (03/18/2024)  Stress: No Stress Concern Present (03/18/2024)  Tobacco Use: Medium Risk (03/20/2024)  Health Literacy: Adequate Health Literacy  (03/20/2024)   Depression Screen    03/20/2024   11:23 AM 11/24/2023    1:38 PM 07/25/2023    2:54 PM 01/06/2023    4:01 PM 11/22/2022   11:48 AM 07/22/2022    9:57 AM  PHQ 2/9 Scores  PHQ - 2 Score 0 2 0 0 0 0  PHQ- 9 Score  4  4  1  1        Data saved with a previous flowsheet row definition     Goals Addressed               This Visit's Progress     walk a little more and stay healthy (pt-stated)        Walk and stay healthy        Visit info / Clinical Intake: Medicare Wellness Visit Type:: Subsequent Annual Wellness Visit Persons participating in visit:: patient & caregiver (sonya daughter) Medicare Wellness Visit Mode:: Video Because this visit was a virtual/telehealth visit:: pt reported vitals If Telephone or Video please confirm:: I connected with the patient using audio enabled telemedicine application and verified that I am speaking with the correct person using two identifiers Patient Location:: home Provider Location:: in office Information given by:: patient Interpreter Needed?: No Pre-visit prep was completed: yes AWV questionnaire completed by patient prior to visit?: yes Date:: 03/18/24 Living arrangements:: with family/others Patient's Overall Health Status Rating: good Typical amount of pain: none Does pain affect daily life?: no Are you currently prescribed opioids?: no  Dietary Habits and Nutritional Risks How many meals a day?: 3 Eats fruit and vegetables daily?: yes Most meals are obtained by: eating out; preparing own meals (daughter) Diabetic:: no  Functional Status Activities of Daily Living (to include ambulation/medication): (!) (Proxy-Rptd) Dependent Ambulation: Independent with device- listed below Home Assistive Devices/Equipment: Eyeglasses Medication Administration: Needs assistance (comment) (daughter assist) Is this a change from baseline?: Pre-admission baseline Home Management: (Proxy-Rptd) Dependent Manage your own  finances?: yes Primary transportation is: family/friends Concerns about vision?: no *vision screening is required for [facility]* Concerns about hearing?: no  Fall Screening Falls in the past year?: (Proxy-Rptd) 0 Number of falls in past year: (Proxy-Rptd) 0 Was there an injury with Fall?: (Proxy-Rptd) 0 Fall Risk Category Calculator: (Proxy-Rptd) 0 Patient Fall Risk Level: (Proxy-Rptd) Low Fall Risk  Fall Risk Patient at Risk for Falls Due to: No Fall Risks Fall risk Follow up: Falls prevention discussed  Home and Transportation Safety: All rugs have non-skid backing?: N/A, no rugs All stairs or steps have railings?: N/A, no stairs Grab bars in the bathtub or shower?: (!) no Have non-skid surface in bathtub or shower?: (!) no Good home lighting?: yes Regular seat belt use?: yes Hospital stays in the last year:: no  Cognitive Assessment Difficulty concentrating, remembering, or making decisions? : yes Will 6CIT or Mini Cog be  Completed: yes What year is it?: 0 points What month is it?: 0 points Give patient an address phrase to remember (5 components): [address] [place]  About what time is it?: 0 points Count backwards from 20 to 1: 0 points Say the months of the year in reverse: 2 points Repeat the address phrase from earlier: 0 points 6 CIT Score: 2 points  Advance Directives (For Healthcare) Does Patient Have a Medical Advance Directive?: Yes Type of Advance Directive: Healthcare Power of Attorney Copy of Healthcare Power of Attorney in Chart?: No - copy requested  Reviewed/Updated  Reviewed/Updated: Reviewed All (Medical, Surgical, Family, Medications, Allergies, Care Teams, Patient Goals)        Objective:    Today's Vitals   03/20/24 1114  BP: 121/81  Weight: 213 lb (96.6 kg)  Height: 5' 2 (1.575 m)   Body mass index is 38.96 kg/m.  Current Medications (verified) Outpatient Encounter Medications as of 03/20/2024  Medication Sig   acetaminophen   (TYLENOL ) 325 MG tablet 1 tablet as needed Orally as needed   amLODipine  (NORVASC ) 10 MG tablet Take 1 tablet (10 mg total) by mouth daily.   atorvastatin  (LIPITOR) 20 MG tablet TAKE 1 TABLET BY MOUTH DAILY AT  6 PM.   Calcium  Carbonate-Vitamin D  (CALCIUM  + D PO) Take 600 mg by mouth 2 (two) times daily.   Cholecalciferol (VITAMIN D  PO) Take 1 tablet by mouth daily.   nebivolol  (BYSTOLIC ) 5 MG tablet Take 1 tablet (5 mg total) by mouth daily.   vitamin B-12 (CYANOCOBALAMIN ) 1000 MCG tablet Take 1,000 mcg by mouth daily.   VITAMIN E PO Take 1 capsule by mouth daily.   No facility-administered encounter medications on file as of 03/20/2024.   Hearing/Vision screen Hearing Screening - Comments:: Pt denies any hearing issues  Vision Screening - Comments:: Wears rx glasses - up to date with routine eye exams with DR Octavia  Immunizations and Health Maintenance Health Maintenance  Topic Date Due   Zoster Vaccines- Shingrix (1 of 2) Never done   COVID-19 Vaccine (2 - Pfizer risk series) 04/15/2020   Influenza Vaccine  08/07/2024 (Originally 12/09/2023)   Medicare Annual Wellness (AWV)  03/20/2025   Mammogram  04/13/2025   Colonoscopy  10/24/2026   DTaP/Tdap/Td (2 - Td or Tdap) 08/04/2032   Pneumococcal Vaccine: 50+ Years  Completed   DEXA SCAN  Completed   Hepatitis C Screening  Completed   Meningococcal B Vaccine  Aged Out   Lung Cancer Screening  Discontinued        Assessment/Plan:  This is a routine wellness examination for [facility].  Patient Care Team: Vicci Barnie NOVAK, MD as PCP - General (Internal Medicine)  I have personally reviewed and noted the following in the patient's chart:   Medical and social history Use of alcohol, tobacco or illicit drugs  Current medications and supplements including opioid prescriptions. Functional ability and status Nutritional status Physical activity Advanced directives List of other physicians Hospitalizations, surgeries, and ER visits in  previous 12 months Vitals Screenings to include cognitive, depression, and falls Referrals and appointments  No orders of the defined types were placed in this encounter.  In addition, I have reviewed and discussed with patient certain preventive protocols, quality metrics, and best practice recommendations. A written personalized care plan for preventive services as well as general preventive health recommendations were provided to patient.   Ellouise VEAR Haws, LPN   [id number]/[id number]   Return in 1 year (on 03/20/2025).  After  Visit Summary: (MyChart) Due to this being a telephonic visit, the after visit summary with patients personalized plan was offered to patient via MyChart   Nurse Notes: nothing significant at this time

## 2024-03-26 ENCOUNTER — Ambulatory Visit: Attending: Internal Medicine | Admitting: Internal Medicine

## 2024-03-26 ENCOUNTER — Encounter: Payer: Self-pay | Admitting: Internal Medicine

## 2024-03-26 VITALS — BP 131/75 | HR 67 | Temp 97.9°F | Ht 62.0 in | Wt 216.0 lb

## 2024-03-26 DIAGNOSIS — E785 Hyperlipidemia, unspecified: Secondary | ICD-10-CM

## 2024-03-26 DIAGNOSIS — F02A0 Dementia in other diseases classified elsewhere, mild, without behavioral disturbance, psychotic disturbance, mood disturbance, and anxiety: Secondary | ICD-10-CM

## 2024-03-26 DIAGNOSIS — G300 Alzheimer's disease with early onset: Secondary | ICD-10-CM | POA: Diagnosis not present

## 2024-03-26 DIAGNOSIS — Z6839 Body mass index (BMI) 39.0-39.9, adult: Secondary | ICD-10-CM

## 2024-03-26 DIAGNOSIS — I10 Essential (primary) hypertension: Secondary | ICD-10-CM

## 2024-03-26 NOTE — Progress Notes (Signed)
 Patient ID: Abigail Powell, female    DOB: November 06, 1950  MRN: [id number]  CC: Hypertension (HTN f/u. [facility] weight loss options/No to shingles due to hx of Guillen Barre syndrome)   Subjective: Abigail Powell is a 73 y.o. female who presents for chronic ds management. Bartholome, her daughter is with her. Her concerns today include:  Patient with history of HTN, TIA 2019, EtOH use disorder in sustain remission, HL, GBS, dementia, hyperthyroidism, left eye tumor status post resection meningioma, Vit D def   Discussed the use of AI scribe software for clinical note transcription with the patient, who gave verbal consent to proceed.  History of Present Illness Abigail Powell is a 73 year old female with hypertension who presents for a routine follow-up visit. She is accompanied by her daughter.  HTN: Her current blood pressure reading is 131/71 mmHg. She continues to take amlodipine  10 mg daily. No chest pain, shortness of breath, or leg swelling. She is also limiting her salt intake.  Obesity: Her weight has increased by 3 pounds since her last visit in July, now weighing 216 pounds. Despite efforts to decrease portion sizes, she sometimes forgets she has eaten and eats again, leading to overeating. She enjoys walking daily for about 30 minutes.  HL: She is on atorvastatin  20 mg daily for cholesterol management with no new issues reported.  Dementia: Regarding her dementia, there are no reports of wandering, hearing voices, or seeing things. No falls or hallucinations reported.    Patient Active Problem List   Diagnosis Date Noted   Mild early onset Alzheimer's dementia without behavioral disturbance, psychotic disturbance, mood disturbance, or anxiety (HCC) 06/18/2022   TIA (transient ischemic attack) 01/30/2018   Hx of Guillain-Barre syndrome 09/06/2012   Memory loss 09/06/2012   Severe alcohol use disorder, in sustained remission (HCC) 03/06/2012   Subclinical hyperthyroidism 03/03/2012    Hypokalemia 03/03/2012   Transaminitis 03/03/2012   Encephalopathy acute 03/01/2012   Weakness 03/01/2012   Hypertension 03/01/2012   Hyperlipidemia 03/01/2012     Current Outpatient Medications on File Prior to Visit  Medication Sig Dispense Refill   acetaminophen  (TYLENOL ) 325 MG tablet 1 tablet as needed Orally as needed     amLODipine  (NORVASC ) 10 MG tablet Take 1 tablet (10 mg total) by mouth daily. 90 tablet 3   atorvastatin  (LIPITOR) 20 MG tablet TAKE 1 TABLET BY MOUTH DAILY AT  6 PM. 90 tablet 3   Calcium  Carbonate-Vitamin D  (CALCIUM  + D PO) Take 600 mg by mouth 2 (two) times daily.     Cholecalciferol (VITAMIN D  PO) Take 1 tablet by mouth daily.     nebivolol  (BYSTOLIC ) 5 MG tablet Take 1 tablet (5 mg total) by mouth daily. 90 tablet 3   vitamin B-12 (CYANOCOBALAMIN ) 1000 MCG tablet Take 1,000 mcg by mouth daily.     VITAMIN E PO Take 1 capsule by mouth daily.     No current facility-administered medications on file prior to visit.    Allergies  Allergen Reactions   Phenergan  [Promethazine  Hcl] Shortness Of Breath   Asa [Aspirin ] Other (See Comments)    Causes ulcer   Memantine  Other (See Comments)   Zofran  [Ondansetron  Hcl] Other (See Comments)    Unknown reaction per pt    Social History   Socioeconomic History   Marital status: Legally Separated    Spouse name: Not on file   Number of children: Not on file   Years of education: Not on file  Highest education level: Associate degree: occupational, scientist, product/process development, or vocational program  Occupational History   Not on file  Tobacco Use   Smoking status: Former   Smokeless tobacco: Never  Vaping Use   Vaping status: Never Used  Substance and Sexual Activity   Alcohol use: Yes    Comment: pt's daughter reports pt drinks beer and scotch every day keeping a thermos filled all day; unsure of how much     Drug use: No   Sexual activity: Not on file  Other Topics Concern   Not on file  Social History Narrative    Not on file   Social Drivers of Health   Financial Resource Strain: Low Risk  (03/18/2024)   Overall Financial Resource Strain (CARDIA)    Difficulty of Paying Living Expenses: Not hard at all  Food Insecurity: No Food Insecurity (03/18/2024)   Hunger Vital Sign    Worried About Running Out of Food in the Last Year: Never true    Ran Out of Food in the Last Year: Never true  Transportation Needs: No Transportation Needs (03/18/2024)   PRAPARE - Administrator, Civil Service (Medical): No    Lack of Transportation (Non-Medical): No  Physical Activity: Insufficiently Active (03/18/2024)   Exercise Vital Sign    Days of Exercise per Week: 6 days    Minutes of Exercise per Session: 20 min  Stress: No Stress Concern Present (03/18/2024)   [employer] of Occupational Health - Occupational Stress Questionnaire    Feeling of Stress: Not at all  Social Connections: Moderately Isolated (03/18/2024)   Social Connection and Isolation Panel    Frequency of Communication with Friends and Family: Once a week    Frequency of Social Gatherings with Friends and Family: Twice a week    Attends Religious Services: More than 4 times per year    Active Member of [employer] or Organizations: No    Attends Banker Meetings: Never    Marital Status: Widowed  Intimate Partner Violence: Not At Risk (03/20/2024)   Humiliation, Afraid, Rape, and Kick questionnaire    Fear of Current or Ex-Partner: No    Emotionally Abused: No    Physically Abused: No    Sexually Abused: No    Family History  Problem Relation Age of Onset   Ovarian cancer Mother    Prostate cancer Father    Alcoholism Father    Hypertension Other    Colon polyps Neg Hx    Colon cancer Neg Hx    Esophageal cancer Neg Hx    Stomach cancer Neg Hx    Rectal cancer Neg Hx     Past Surgical History:  Procedure Laterality Date   ABDOMINAL HYSTERECTOMY     CHOLECYSTECTOMY  02/26/2012   Procedure: LAPAROSCOPIC  CHOLECYSTECTOMY WITH INTRAOPERATIVE CHOLANGIOGRAM;  Surgeon: Redell Faith, DO;  Location: [facility];  Service: General;  Laterality: N/A;   Orthoscopic knee surgery     TUMOR REMOVAL Left 08/07/12   behind left eye   WISDOM TOOTH EXTRACTION      ROS: Review of Systems Negative except as stated above  PHYSICAL EXAM: BP 131/75 (BP Location: Left Arm, Patient Position: Sitting, Cuff Size: Large)   Pulse 67   Temp 97.9 F (36.6 C) (Oral)   Ht 5' 2 (1.575 m)   Wt 216 lb (98 kg)   SpO2 98%   BMI 39.51 kg/m   Wt Readings from Last 3 Encounters:  03/26/24 216 lb (98  kg)  03/20/24 213 lb (96.6 kg)  11/24/23 213 lb (96.6 kg)    Physical Exam  General appearance - alert, well appearing, elderly AAF  and in no distress Mental status - normal mood, behavior, speech, dress, motor activity. Follows commands without difficulty Chest - clear to auscultation, no wheezes, rales or rhonchi, symmetric air entry Heart - normal rate, regular rhythm, normal S1, S2, no murmurs, rubs, clicks or gallops Extremities - peripheral pulses normal, no pedal edema, no clubbing or cyanosis      Latest Ref Rng & Units 07/25/2023    3:44 PM 06/18/2022   10:15 AM 01/30/2018    2:26 AM  CMP  Glucose 70 - 99 mg/dL 892  82    BUN 8 - 27 mg/dL 19  25    Creatinine 9.42 - 1.00 mg/dL 8.98  8.94  9.25   Sodium 134 - 144 mmol/L 143  143    Potassium 3.5 - 5.2 mmol/L 4.4  5.3    Chloride 96 - 106 mmol/L 104  105    CO2 20 - 29 mmol/L 25  22    Calcium  8.7 - 10.3 mg/dL 89.8  9.7    Total Protein 6.0 - 8.5 g/dL 7.5  7.0    Total Bilirubin 0.0 - 1.2 mg/dL 0.5  0.4    Alkaline Phos 44 - 121 IU/L 92  88    AST 0 - 40 IU/L 17  18    ALT 0 - 32 IU/L 13  12     Lipid Panel     Component Value Date/Time   CHOL 170 07/25/2023 1544   TRIG 135 07/25/2023 1544   HDL 48 07/25/2023 1544   CHOLHDL 3.5 07/25/2023 1544   CHOLHDL 3.6 01/30/2018 0220   VLDL 11 01/30/2018 0220   LDLCALC 98 07/25/2023 1544    CBC     Component Value Date/Time   WBC 6.4 07/25/2023 1544   WBC 6.7 01/30/2018 0226   RBC 4.65 07/25/2023 1544   RBC 4.22 01/30/2018 0226   HGB 14.3 07/25/2023 1544   HCT 43.4 07/25/2023 1544   PLT 331 07/25/2023 1544   MCV 93 07/25/2023 1544   MCH 30.8 07/25/2023 1544   MCH 31.3 01/30/2018 0226   MCHC 32.9 07/25/2023 1544   MCHC 32.2 01/30/2018 0226   RDW 12.0 07/25/2023 1544   LYMPHSABS 2.0 01/29/2018 1906   MONOABS 0.7 01/29/2018 1906   EOSABS 0.1 01/29/2018 1906   BASOSABS 0.0 01/29/2018 1906    ASSESSMENT AND PLAN: 1. Essential hypertension (Primary) Close to goal - Continue amlodipine  10 mg daily. - Continue to limit salt intake.  2. Morbid obesity (HCC) Weight increased by 3 pounds. Difficulty with portion control and overeating. Discussed impact on joint health and importance of portion control. Declined trail of GLP1 agonist. - Encouraged portion control and mindful eating. - Continue daily walking for 30 minutes.  3. Mild early onset Alzheimer's dementia without behavioral disturbance, psychotic disturbance, mood disturbance, or anxiety (HCC) Stable and in the good care of her daughter.  4. Hyperlipidemia, unspecified hyperlipidemia type Continue Lipitor 20 mg daily    Patient was given the opportunity to ask questions.  Patient verbalized understanding of the plan and was able to repeat key elements of the plan.   This documentation was completed using Paediatric nurse.  Any transcriptional errors are unintentional.  No orders of the defined types were placed in this encounter.    Requested Prescriptions  No prescriptions requested or ordered in this encounter    Return in about 4 months (around 07/24/2024).  Barnie Louder, MD, FACP

## 2024-03-26 NOTE — Patient Instructions (Signed)
  VISIT SUMMARY: Today, you came in for a routine follow-up visit. Your blood pressure is slightly elevated, and your weight has increased by 3 pounds since your last visit. We discussed your current medications and lifestyle habits, including your daily walks and efforts to manage your portion sizes. You are accompanied by your daughter, and there are no new issues with your cholesterol management or dementia.  YOUR PLAN: -ESSENTIAL HYPERTENSION: Essential hypertension means that your blood pressure is higher than normal. Your current reading is 131/71 mmHg, and our goal is to keep it at 130/80 mmHg or lower. Please continue taking amlodipine  10 mg daily and limit your salt intake.  -MORBID OBESITY DUE TO EXCESS CALORIES: Morbid obesity means having a very high body weight, which can affect your health. Your weight has increased by 3 pounds, and you are having difficulty with portion control and overeating. Please focus on portion control and mindful eating, and continue your daily 30-minute walks.  -HYPERLIPIDEMIA: Hyperlipidemia means having high levels of fats in your blood. You are currently taking atorvastatin  20 mg daily to manage this, and there are no new issues. Please continue taking atorvastatin  20 mg daily.  INSTRUCTIONS: Please schedule a follow-up appointment in 3 months to monitor your blood pressure, weight, and cholesterol levels. Continue your current medications and lifestyle habits, and contact us  if you experience any new symptoms or have any concerns.                      Contains text generated by [employer]bridge.                                 Contains text generated by Abridge.                          Contains text generated by Abridge.                                 Contains text generated by [employer]bridge.

## 2024-06-06 ENCOUNTER — Other Ambulatory Visit: Payer: Self-pay | Admitting: Internal Medicine

## 2024-06-06 DIAGNOSIS — Z1231 Encounter for screening mammogram for malignant neoplasm of breast: Secondary | ICD-10-CM

## 2024-06-15 ENCOUNTER — Ambulatory Visit: Admission: RE | Admit: 2024-06-15 | Source: Ambulatory Visit

## 2024-06-15 DIAGNOSIS — Z1231 Encounter for screening mammogram for malignant neoplasm of breast: Secondary | ICD-10-CM

## 2024-08-02 ENCOUNTER — Ambulatory Visit: Admitting: Internal Medicine
